# Patient Record
Sex: Male | Born: 1955 | Race: White | Hispanic: No | Smoking: Never smoker
Health system: Southern US, Community
[De-identification: ages and names within clinical notes are randomized; demographics above are authoritative.]

## PROBLEM LIST (undated history)

## (undated) DIAGNOSIS — E782 Mixed hyperlipidemia: Secondary | ICD-10-CM

## (undated) DIAGNOSIS — I350 Nonrheumatic aortic (valve) stenosis: Secondary | ICD-10-CM

## (undated) DIAGNOSIS — R42 Dizziness and giddiness: Secondary | ICD-10-CM

## (undated) DIAGNOSIS — Q231 Congenital insufficiency of aortic valve: Secondary | ICD-10-CM

## (undated) DIAGNOSIS — I639 Cerebral infarction, unspecified: Secondary | ICD-10-CM

## (undated) DIAGNOSIS — M109 Gout, unspecified: Secondary | ICD-10-CM

## (undated) DIAGNOSIS — E119 Type 2 diabetes mellitus without complications: Secondary | ICD-10-CM

## (undated) DIAGNOSIS — G43109 Migraine with aura, not intractable, without status migrainosus: Secondary | ICD-10-CM

## (undated) DIAGNOSIS — K279 Peptic ulcer, site unspecified, unspecified as acute or chronic, without hemorrhage or perforation: Secondary | ICD-10-CM

## (undated) DIAGNOSIS — I2510 Atherosclerotic heart disease of native coronary artery without angina pectoris: Secondary | ICD-10-CM

## (undated) DIAGNOSIS — F329 Major depressive disorder, single episode, unspecified: Secondary | ICD-10-CM

## (undated) DIAGNOSIS — F1010 Alcohol abuse, uncomplicated: Secondary | ICD-10-CM

## (undated) DIAGNOSIS — I209 Angina pectoris, unspecified: Secondary | ICD-10-CM

## (undated) DIAGNOSIS — E785 Hyperlipidemia, unspecified: Secondary | ICD-10-CM

## (undated) DIAGNOSIS — K219 Gastro-esophageal reflux disease without esophagitis: Secondary | ICD-10-CM

## (undated) DIAGNOSIS — G4730 Sleep apnea, unspecified: Secondary | ICD-10-CM

## (undated) DIAGNOSIS — Q2381 Bicuspid aortic valve: Secondary | ICD-10-CM

## (undated) DIAGNOSIS — I10 Essential (primary) hypertension: Secondary | ICD-10-CM

## (undated) HISTORY — DX: Angina pectoris, unspecified: I20.9

## (undated) HISTORY — DX: Gastro-esophageal reflux disease without esophagitis: K21.9

## (undated) HISTORY — DX: Congenital insufficiency of aortic valve: Q23.1

## (undated) HISTORY — PX: RHINOPLASTY: SUR1284

## (undated) HISTORY — DX: Cerebral infarction, unspecified: I63.9

## (undated) HISTORY — DX: Alcohol abuse, uncomplicated: F10.10

## (undated) HISTORY — DX: Dizziness and giddiness: R42

## (undated) HISTORY — DX: Major depressive disorder, single episode, unspecified: F32.9

## (undated) HISTORY — PX: CIRCUMCISION: SUR203

## (undated) HISTORY — DX: Hyperlipidemia, unspecified: E78.5

## (undated) HISTORY — DX: Migraine with aura, not intractable, without status migrainosus: G43.109

## (undated) HISTORY — DX: Peptic ulcer, site unspecified, unspecified as acute or chronic, without hemorrhage or perforation: K27.9

## (undated) HISTORY — DX: Sleep apnea, unspecified: G47.30

## (undated) HISTORY — DX: Gout, unspecified: M10.9

## (undated) HISTORY — DX: Bicuspid aortic valve: Q23.81

## (undated) HISTORY — DX: Essential (primary) hypertension: I10

## (undated) HISTORY — PX: INGUINAL HERNIA REPAIR: SUR1180

---

## 2007-07-21 ENCOUNTER — Emergency Department (HOSPITAL_COMMUNITY): Admission: EM | Admit: 2007-07-21 | Discharge: 2007-07-21 | Payer: Self-pay | Admitting: Emergency Medicine

## 2007-12-03 IMAGING — CT CT ABDOMEN W/ CM
3 of 8 series · 12 of 46 positions shown, 18 images · IV contrast (APPLIED)
Comparison: None

CHEST CT WITH CONTRAST

CLINICAL DATA: This was kicked in the back by horse and has large hematoma on
the midback.
TECHNIQUE: Multidetector CT imaging of the chest, abdomen, and pelvis was
performed following the standard protocol during bolus administration of
intravenous contrast. The patient was unable to lay supine, and was scanned in
the prone position.

Contrast:  100 cc Omnipaque 300

[Series 2: chest 5.0 b31f · axial · 0.67mm/px · z∈[-356,-261]mm · 3 of 166 slices shown]
[im 16/166  soft-tissue]
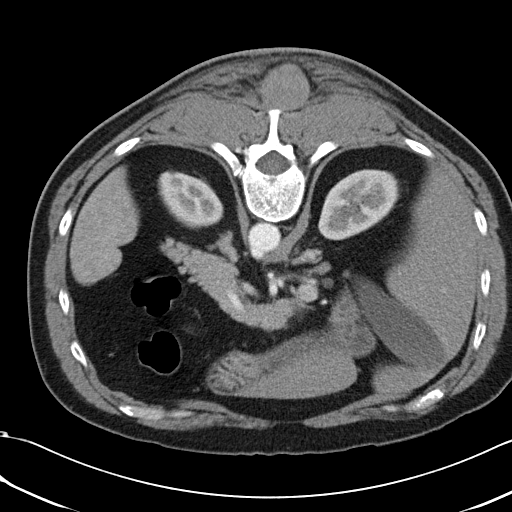
[im 31/166  soft-tissue]
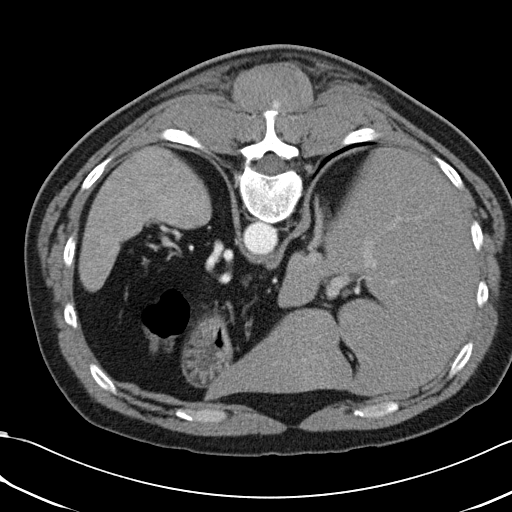
[im 61/166  soft-tissue]
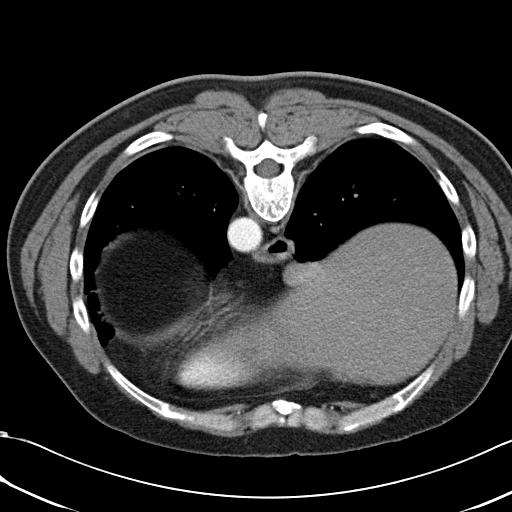

[Series 5: abd/pel 5.0 b31f · axial · 0.80mm/px · z∈[-683,-298]mm · 6 of 109 slices shown, 11 images]
[im 16/109  soft-tissue]
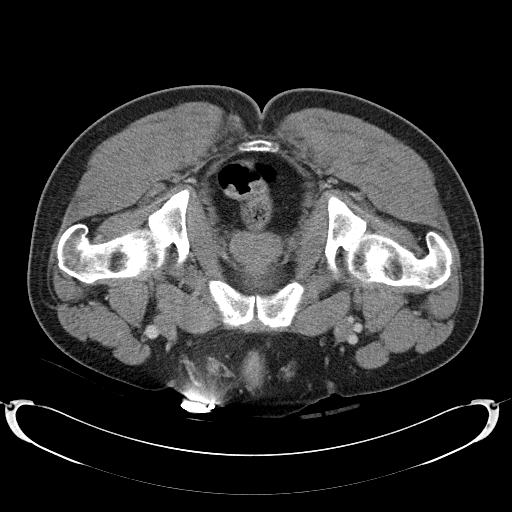
[im 16/109  bone]
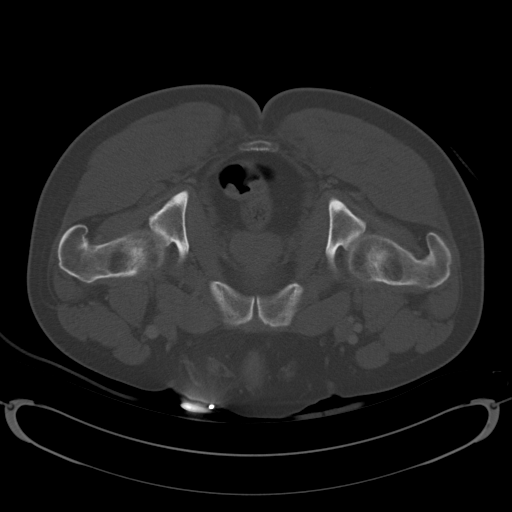
[im 31/109  soft-tissue]
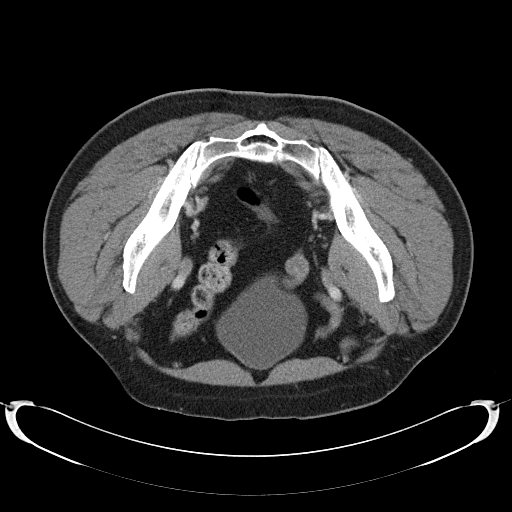
[im 47/109  soft-tissue]
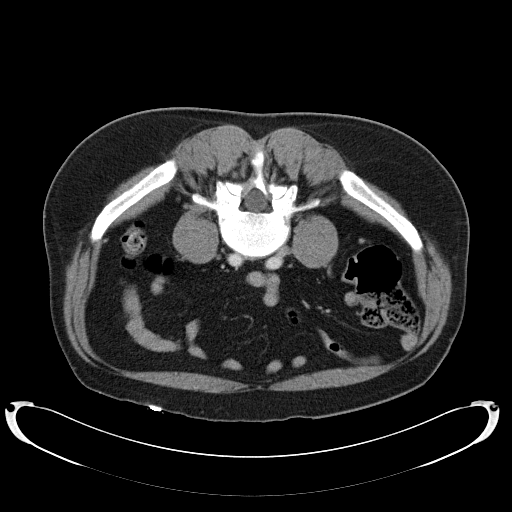
[im 47/109  lung]
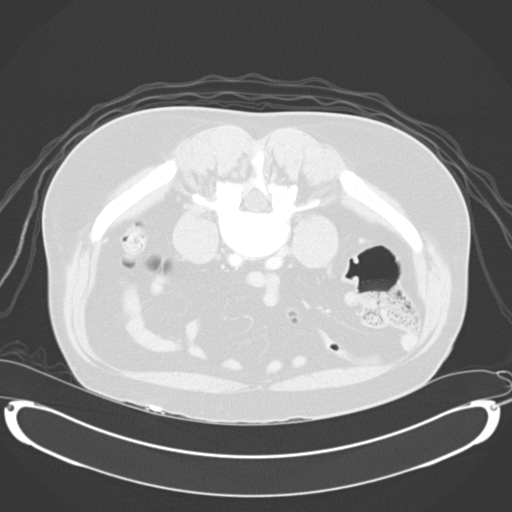
[im 62/109  soft-tissue]
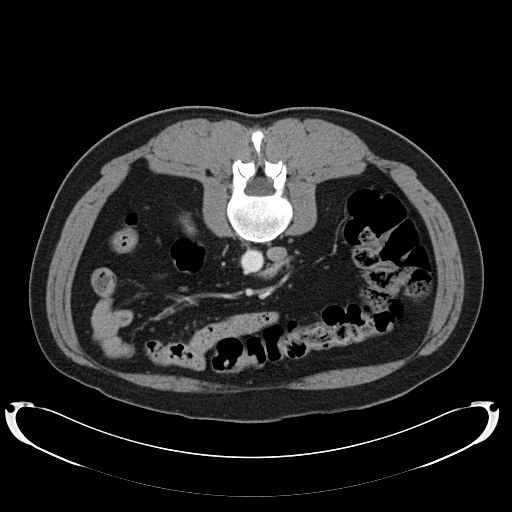
[im 62/109  lung]
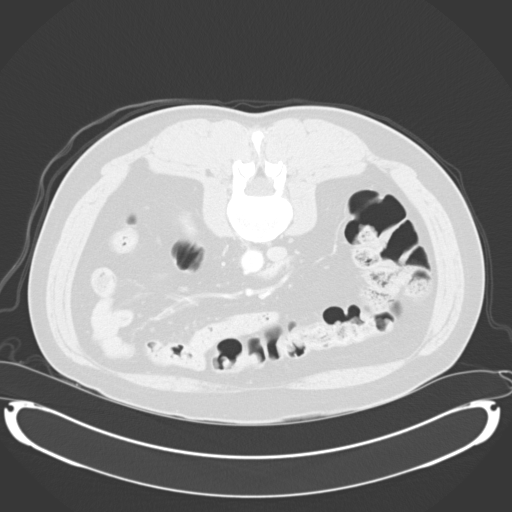
[im 78/109  soft-tissue]
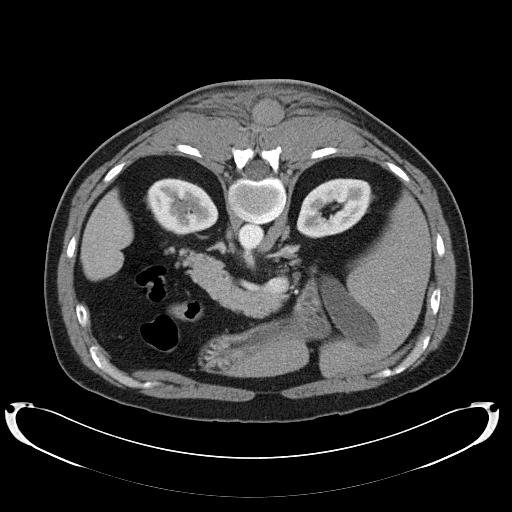
[im 78/109  lung]
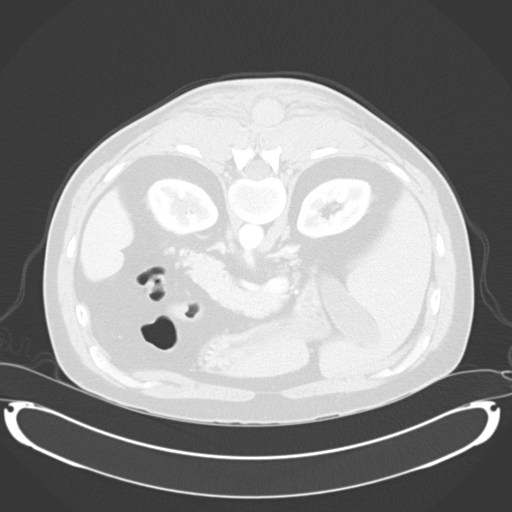
[im 93/109  soft-tissue]
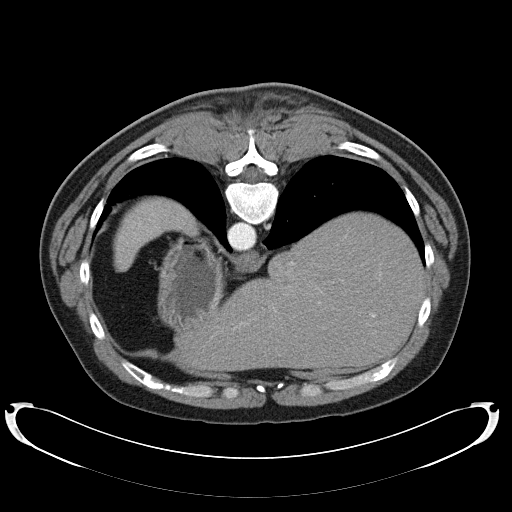
[im 93/109  lung]
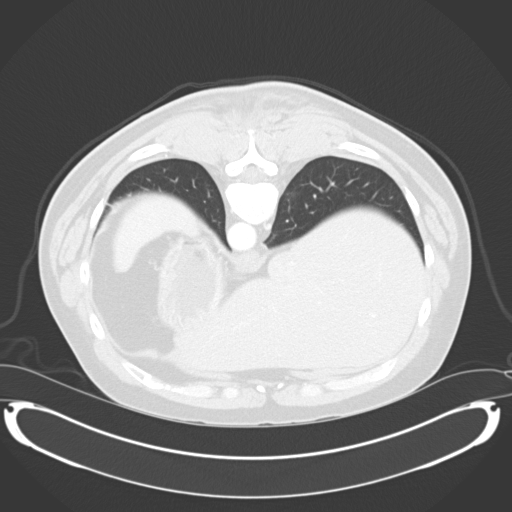

[Series 605: a/p coronals · coronal · 1.07mm/px · 3 of 132 slices shown, 4 images]
[im 33/132  soft-tissue]
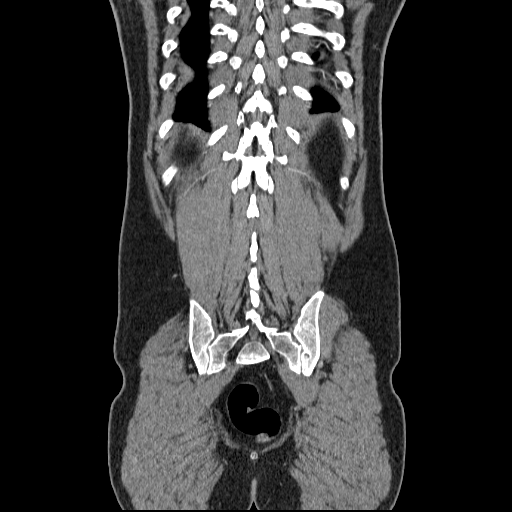
[im 66/132  soft-tissue]
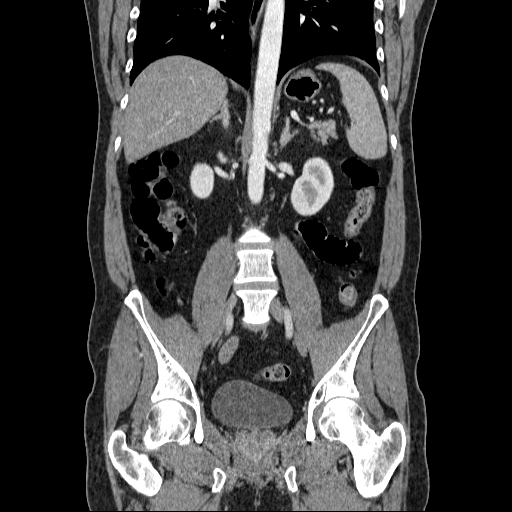
[im 66/132  bone]
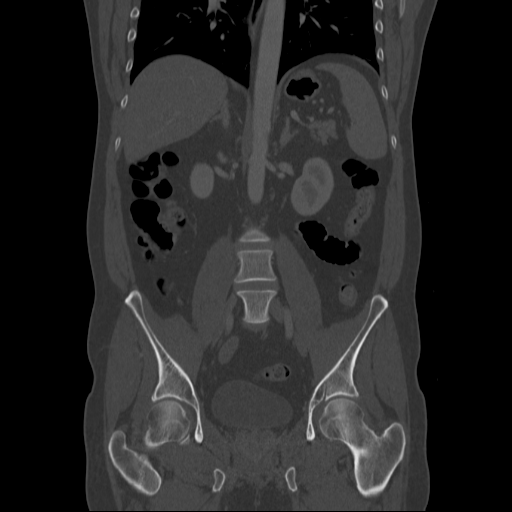
[im 99/132  soft-tissue]
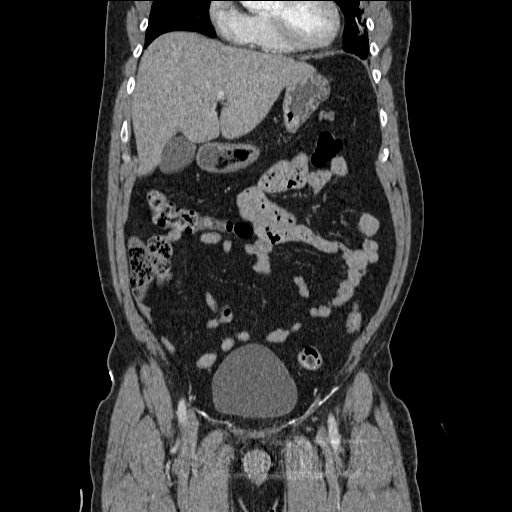

[12 of 46 positions shown; findings below may reference images not displayed]

FINDINGS: The thoracic inlet appears unremarkable. No aortic dissection
identified. Gas is noted in the esophagus which is mildly dilated. No hilar or
mediastinal adenopathy noted. Mild dependent subsegmental atelectasis is
present. No significant pleural effusion is noted. There is a suggestion of a
right-sided os acromiale, and possibly a left os acromiale as well.

IMPRESSION

1. Mild dependent subsegmental atelectasis. No acute thoracic findings.

ABDOMEN CT WITH CONTRAST
FINDINGS: There is a midline hematoma posterior to the spinous processes of the
T10, T11, and T12 levels. A small amount of active extravasation of contrast
posterior to the T11 spinous process is noted, compatible with active bleeding
into this hematoma. A total size of the hematoma is 5.8 x 3.2 x 7.7 cm. The
hematomas is in the subcutaneous tissues.  We do not demonstrate any associated
spinous process fracture, or other fracture in the lower thoracic spine or upper
lumbar spine.

No hepatic or splenic laceration is identified. No perihepatic or perisplenic
ascites. The pancreas, adrenal glands, and kidneys appear unremarkable. A lymph
node adjacent to the left adrenal gland measures 11 x 7 mm, which is not
pathologically enlarged by CT size criteria. No significant mesenteric lesion is
identified. No discrete bowel abnormality is noted. No biliary dilatation is
noted.

IMPRESSION

1. Subcutaneous hematoma overlying the spinous processes of T10, T11, and T12,
with a small amount of active extravasation compatible with active bleeding into
the hematoma at the T11 level. No underlying fracture is seen; no discrete
intra-abdominal injury is noted.

PELVIS CT WITH CONTRAST
FINDINGS: No pelvic fractures identified. No free pelvic fluid noted. The
urinary bladder appears unremarkable. No acute pelvic abnormality is evident.
The appendix appears normal.

IMPRESSION

1. No acute pelvic findings.

## 2007-12-03 IMAGING — CR DG THORACIC SPINE 2V
5 series · 5 of 5 positions shown · non-contrast
Comparison: none

CLINICAL DATA: Kicked by horse in the back

THORACIC SPINE - 2 VIEW

[t t-spine a.p. (1 of 2)]
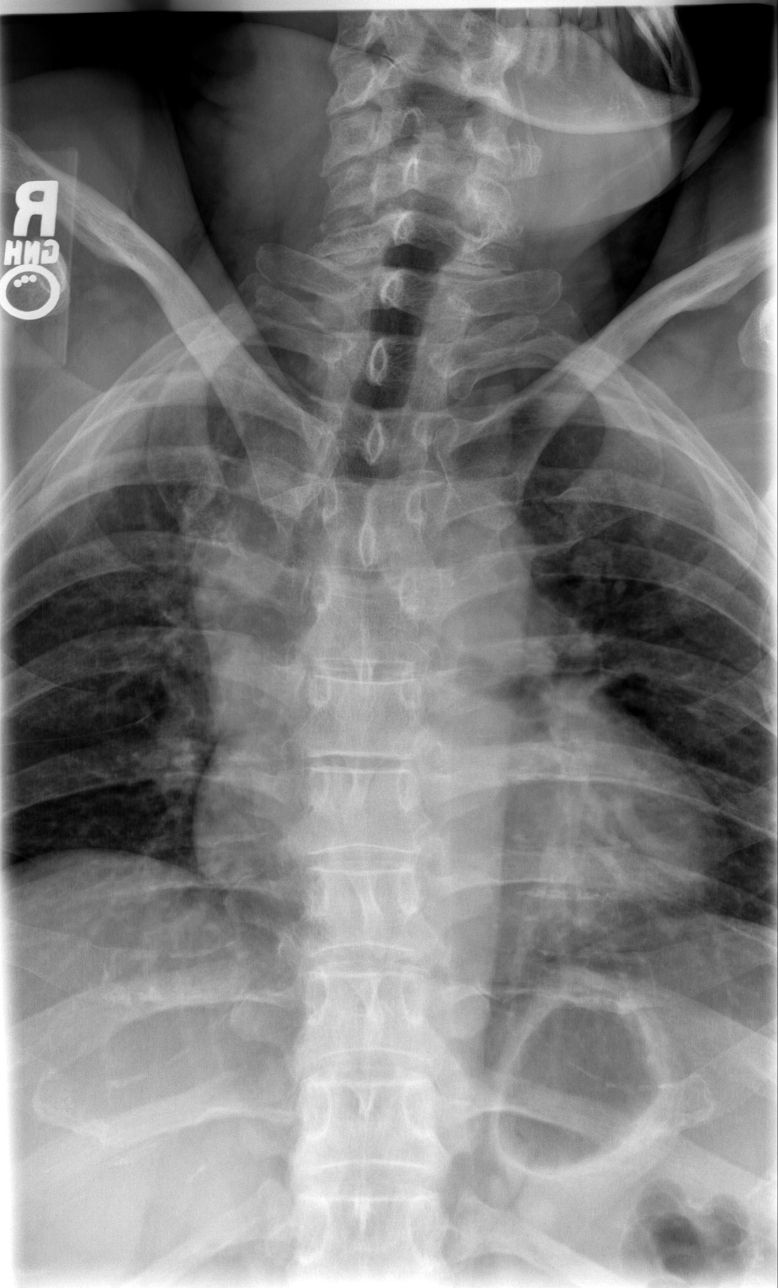

[t t-spine a.p. (2 of 2)]
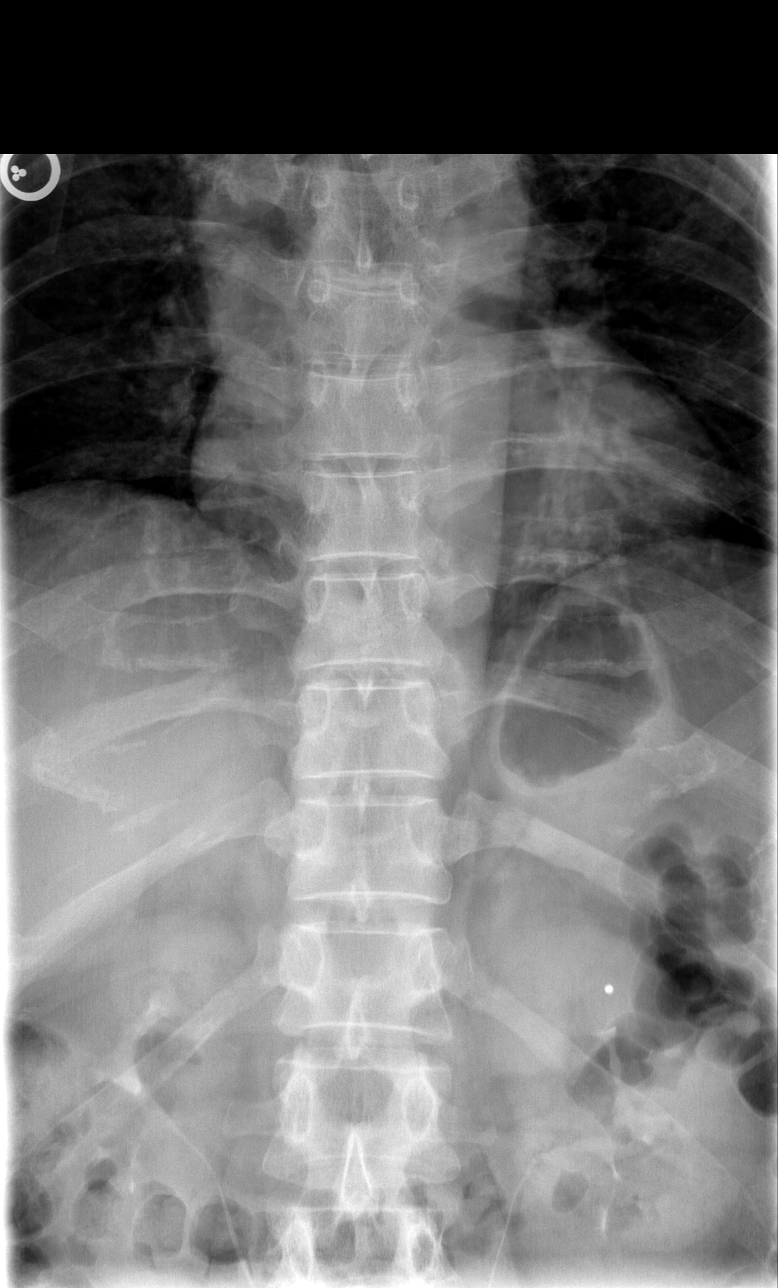

[t t-spine lat (1 of 3)]
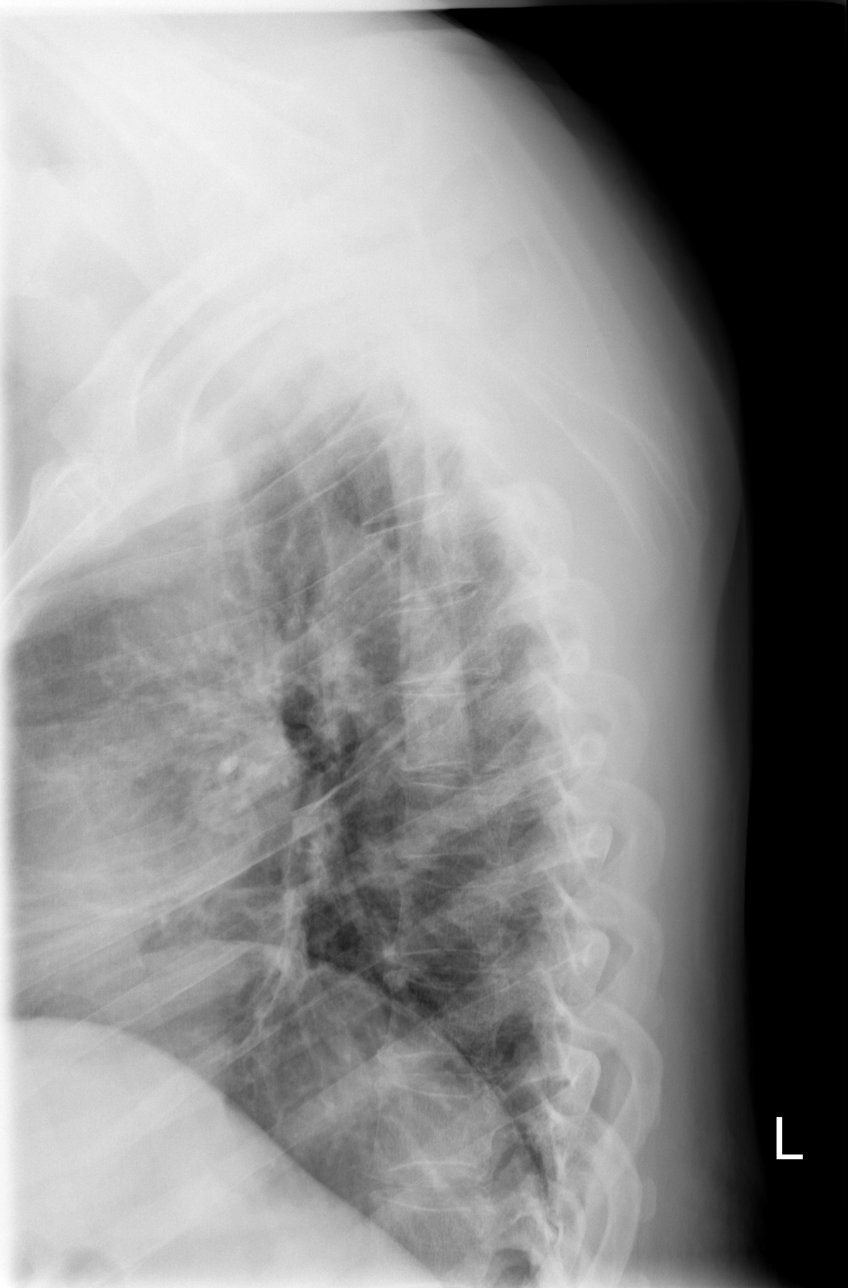

[t t-spine lat (2 of 3)]
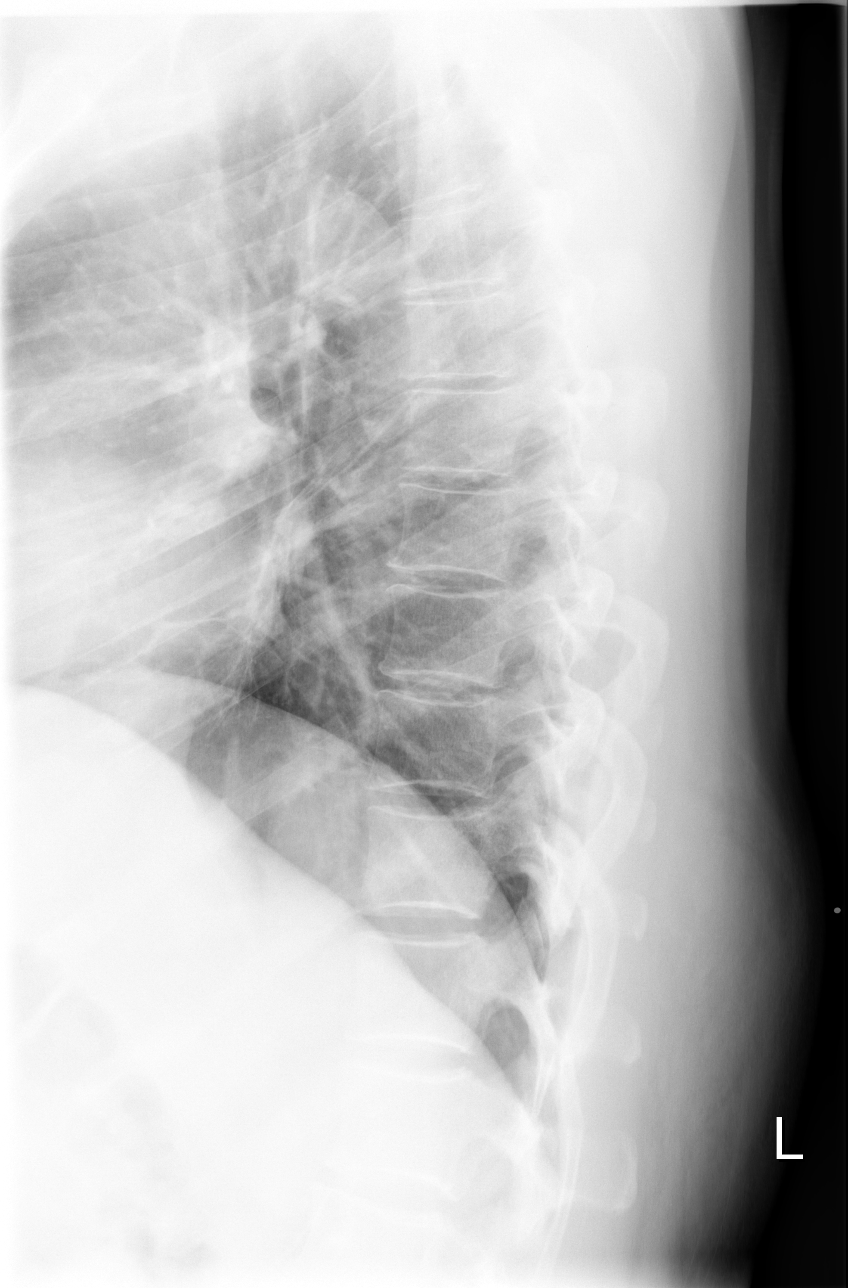

[t t-spine lat (3 of 3)]
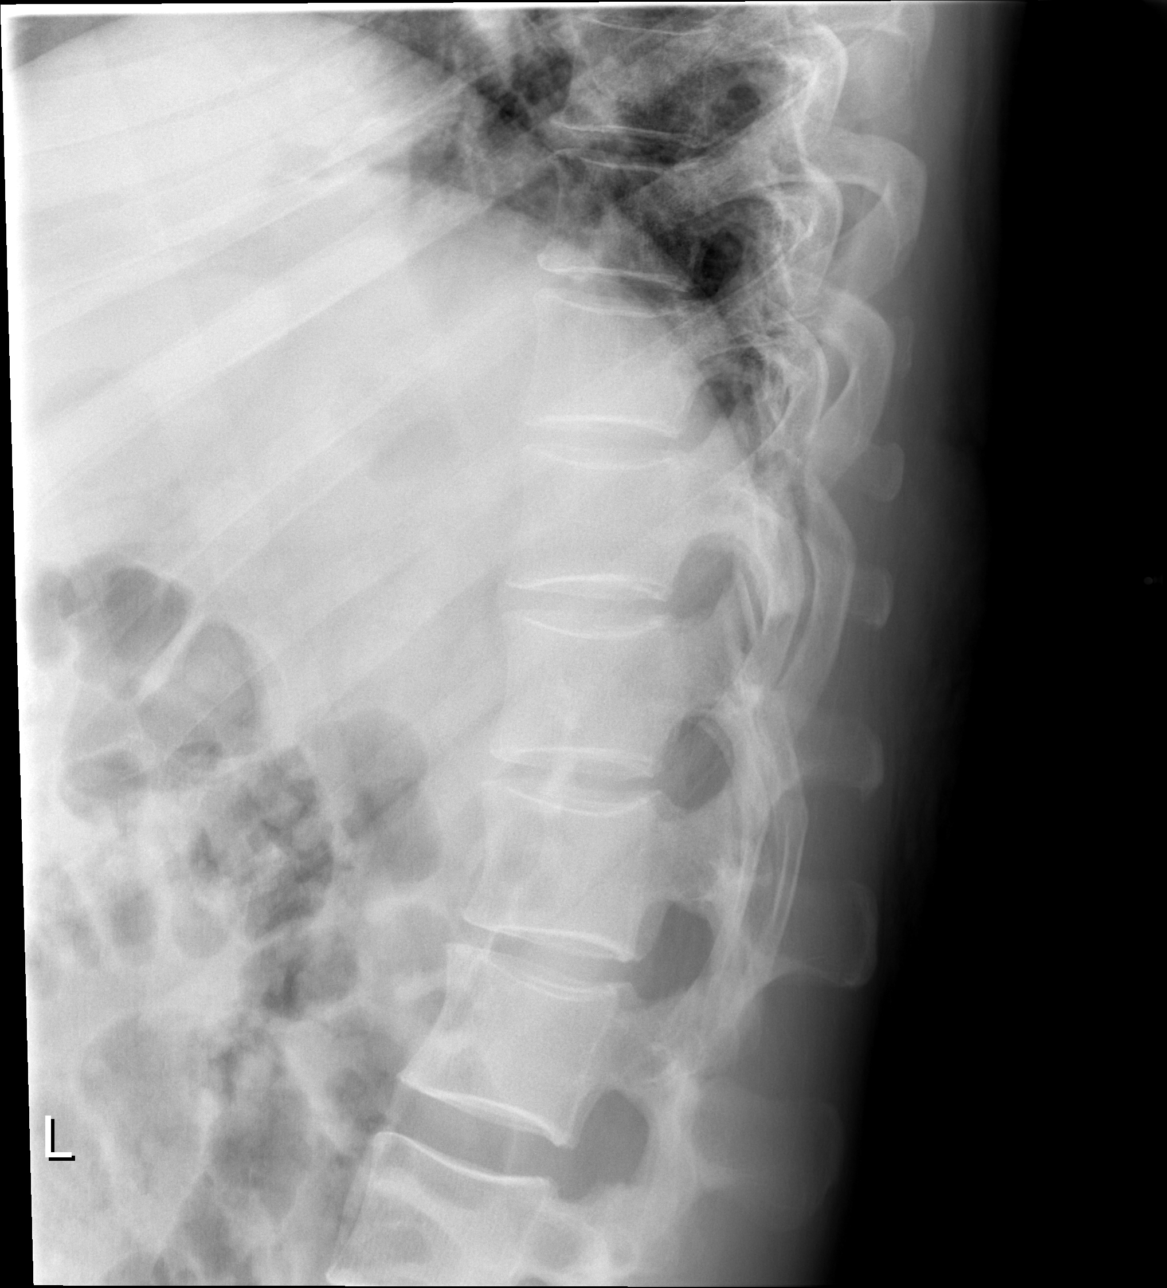

[5 of 5 positions shown; findings below may reference images not displayed]

FINDINGS: Two views were obtained on 5 images. Contrast medium is noted in the
renal collecting systems and proximal ureters.

No thoracic spine fractures identified. A BB-marker overlies a soft tissue
prominence projecting posterior to the spinous processes of T10, T11, and T12,
compatible with the patient's subcutaneous hematoma. No pleural effusion noted.
No vertebral fracture is evident.

IMPRESSION

1. No fractures identified. Subcutaneous hematoma overlies the spinous processes
of the T10, T11, and T12 levels.

## 2009-10-21 ENCOUNTER — Encounter: Admission: RE | Admit: 2009-10-21 | Discharge: 2009-12-23 | Payer: Self-pay | Admitting: Orthopedic Surgery

## 2012-11-24 HISTORY — PX: CYST REMOVAL TRUNK: SHX6283

## 2013-07-08 DIAGNOSIS — R42 Dizziness and giddiness: Secondary | ICD-10-CM

## 2013-07-09 DIAGNOSIS — G459 Transient cerebral ischemic attack, unspecified: Secondary | ICD-10-CM

## 2013-08-19 ENCOUNTER — Encounter: Payer: Self-pay | Admitting: Cardiology

## 2013-09-01 ENCOUNTER — Encounter: Payer: Self-pay | Admitting: Neurology

## 2013-09-02 ENCOUNTER — Encounter: Payer: Self-pay | Admitting: Neurology

## 2013-09-02 ENCOUNTER — Ambulatory Visit (INDEPENDENT_AMBULATORY_CARE_PROVIDER_SITE_OTHER): Payer: BC Managed Care – PPO | Admitting: Neurology

## 2013-09-02 VITALS — BP 140/90 | HR 64 | Ht 72.0 in | Wt 220.5 lb

## 2013-09-02 DIAGNOSIS — R42 Dizziness and giddiness: Secondary | ICD-10-CM | POA: Insufficient documentation

## 2013-09-02 DIAGNOSIS — G43109 Migraine with aura, not intractable, without status migrainosus: Secondary | ICD-10-CM

## 2013-09-02 HISTORY — DX: Dizziness and giddiness: R42

## 2013-09-02 HISTORY — DX: Migraine with aura, not intractable, without status migrainosus: G43.109

## 2013-09-02 MED ORDER — PROPRANOLOL HCL 10 MG PO TABS: ORAL_TABLET | ORAL | Status: DC

## 2013-09-02 NOTE — Progress Notes (Signed)
Reason for visit: TIA  Raymond Roberson is a 57 y.o. male  History of present illness:  Raymond Roberson is a 57 year old right-handed white male with a history of hypertension. The patient was admitted to the hospital in mid July 2014 at [facility] with an event of visual disturbance. The patient indicates that he was watching TV, and he suddenly had some visual alteration to the left, associated with bright flashing lights going in a circular pattern. Later on, he developed tunnel vision in the periphery. The patient indicated that the visual disturbances lasted 2-3 hours, and then cleared. The patient went to the hospital, and he recalls having headaches while at the hospital. The patient indicates that his headaches have become more frequent over the month prior to that hospitalization. The headaches are frontal and occipital in nature. The patient may have some dizziness and nausea with the headaches. The patient did have some nausea while in the hospital. 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 The patient had a 2-D echocardiogram showing an ejection fraction of 55-60% with a bicuspid aortic valve. The patient was not on aspirin on a daily basis prior to admission. Carotid Doppler studies showed less than 50% stenosis bilaterally. The patient indicates that after discharge, he had 2 events with visual field disturbance with blurring of vision off to the left, scintillating scotoma, and then tunnel vision. The episodes last about half an hour and clear. The patient did not have headache with these events. The patient reported no focal numbness or weakness of the face, arms, or legs. The patient denied slurred speech, difficulty swallowing, balance issues, or problems controlling the bowels or the bladder. The patient is sent to this office for an evaluation. The patient is now on low-dose aspirin.  Past Medical History  Diagnosis Date  . Gouty  arthritis   . Major depression   . Sleep apnea   . CVA (cerebral vascular accident)   . Bicuspid aortic valve   . Hypertension   . GERD (gastroesophageal reflux disease)   . Other and unspecified hyperlipidemia   . Acute angina   . Chronic alcohol abuse   . Classic migraine 09/02/2013  . Dizziness and giddiness 09/02/2013  . Peptic ulcer disease     Past Surgical History  Procedure Laterality Date  . Cyst removal trunk  12/13    back  . Rhinoplasty    . Inguinal hernia repair Bilateral     Family History  Problem Relation Age of Onset  . CAD Father   . Heart failure Father   . Allergies Mother   . Anxiety disorder Mother     Social history:  reports that he has never smoked. His smokeless tobacco use includes Snuff. He reports that  drinks alcohol. He reports that he does not use illicit drugs.  Medications:  Current Outpatient Prescriptions on File Prior to Visit  Medication Sig Dispense Refill  . allopurinol (ZYLOPRIM) 300 MG tablet Take 300 mg by mouth daily.      Marland Kitchen ALPRAZolam (XANAX) 0.5 MG tablet Take 0.5 mg by mouth 2 (two) times daily.      Marland Kitchen aspirin 81 MG tablet Take 81 mg by mouth daily.      . cetirizine (ZYRTEC) 10 MG tablet Take 10 mg by mouth daily.      . citalopram (CELEXA) 40 MG tablet Take 40 mg by mouth daily.      . hydrocortisone (ANUSOL-HC) 2.5 %  rectal cream Place 1 application rectally 3 (three) times daily.      Marland Kitchen lisinopril-hydrochlorothiazide (PRINZIDE,ZESTORETIC) 10-12.5 MG per tablet Take 1 tablet by mouth daily.      . pantoprazole (PROTONIX) 40 MG tablet Take 40 mg by mouth daily.       No current facility-administered medications on file prior to visit.      Allergies  Allergen Reactions  . Naproxen     nausea  . Sulfa Antibiotics     nausea    ROS:  Out of a complete 14 system review of symptoms, the patient complains only of the following symptoms, and all other reviewed systems are negative.  Weight loss, fatigue Chest pains,  heart murmur Moles Blurred vision Snoring Joint pain, joint swelling, aching muscles Memory loss, headache, dizziness Depression, anxiety  Blood pressure 140/90, pulse 64, height 6' (1.829 m), weight 220 lb 8 oz (100.018 kg).  Physical Exam  General: The patient is alert and cooperative at the time of the examination.  Head: Pupils are equal, round, and reactive to light. Discs are flat bilaterally.  Neck: The neck is supple, no carotid bruits are noted.  Respiratory: The respiratory examination is clear.  Cardiovascular: The cardiovascular examination reveals a regular rate and rhythm, no obvious murmurs or rubs are noted.  Skin: Extremities are without significant edema.  Neurologic Exam  Mental status:  Cranial nerves: Facial symmetry is not present. Slight ptosis is noted on the right. There is good sensation of the face to pinprick and soft touch bilaterally. The strength of the facial muscles and the muscles to head turning and shoulder shrug are normal bilaterally. Speech is well enunciated, no aphasia or dysarthria is noted. Extraocular movements are full. Visual fields are full.  Motor: The motor testing reveals 5 over 5 strength of all 4 extremities. Good symmetric motor tone is noted throughout.  Sensory: Sensory testing is intact to pinprick, soft touch, vibration sensation, and position sense on all 4 extremities, with exception that there is some decrease in position sense of the right foot.. No evidence of extinction is noted.  Coordination: Cerebellar testing reveals good finger-nose-finger and heel-to-shin bilaterally.  Gait and station: Gait is normal. Tandem gait is normal. Romberg is negative. No drift is seen.  Reflexes: Deep tendon reflexes are symmetric and normal bilaterally. Toes are downgoing bilaterally.   Assessment/Plan:  1. Probable classic migraine headache  2. 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 The patient had a visual aura with positive visual phenomenon, and tunnel vision. The patient had a headache following the visual field disturbance. The patient has had 2 episodes of migraine equivalent since being in the hospital. The patient is on low-dose aspirin, and he should continue this for now. The patient is concerned about the visual disturbances, as he works as a Naval architect. The patient wishes to go on medications for the migraine. I will start low dose Inderal at this time. The patient will followup in 4 months.  Marlan Palau MD 09/02/2013 7:01 PM  [facility] [address] [place], [place] [address]  Phone [phone] Fax [phone]

## 2013-09-12 ENCOUNTER — Ambulatory Visit: Payer: BC Managed Care – PPO | Admitting: Cardiovascular Disease

## 2013-09-15 ENCOUNTER — Telehealth: Payer: Self-pay | Admitting: Neurology

## 2013-09-16 MED ORDER — TOPIRAMATE 25 MG PO TABS: 25.0000 mg | ORAL_TABLET | Freq: Every day | ORAL | Status: DC

## 2013-09-16 NOTE — Telephone Encounter (Signed)
Call pt back about the medication propranolol 10mg , pt stated that he is having problems taking this medication and trying to work. Pt states that the medication has level out some, still having headaches a little, but he is getting ready to start the second dose, which is 2 tablets twice daily. Pt is concern about taking this medication. Pt wanted to know if the doctor could excuse him from work until the medication gets under control.

## 2013-09-16 NOTE — Telephone Encounter (Signed)
I called the patient. The patient indicates that he is having dizziness on a very low-dose propranolol, 10 mg twice daily. I do not believe that this medication will be tolerated by this patient. He will stop the medication, and start Topamax in low dose. He will contact me if he has issues with this drug. I reviewed the various potential side effects of this medication with the patient.

## 2013-11-03 ENCOUNTER — Ambulatory Visit: Payer: BC Managed Care – PPO | Admitting: Cardiovascular Disease

## 2014-01-09 ENCOUNTER — Ambulatory Visit: Payer: BC Managed Care – PPO | Admitting: Nurse Practitioner

## 2017-06-19 DIAGNOSIS — Z0271 Encounter for disability determination: Secondary | ICD-10-CM

## 2019-08-14 DIAGNOSIS — I10 Essential (primary) hypertension: Secondary | ICD-10-CM | POA: Diagnosis not present

## 2019-08-14 DIAGNOSIS — F331 Major depressive disorder, recurrent, moderate: Secondary | ICD-10-CM | POA: Diagnosis not present

## 2019-08-14 DIAGNOSIS — E782 Mixed hyperlipidemia: Secondary | ICD-10-CM | POA: Diagnosis not present

## 2019-08-14 DIAGNOSIS — K210 Gastro-esophageal reflux disease with esophagitis: Secondary | ICD-10-CM | POA: Diagnosis not present

## 2019-08-14 DIAGNOSIS — R5383 Other fatigue: Secondary | ICD-10-CM | POA: Diagnosis not present

## 2019-08-14 DIAGNOSIS — Q231 Congenital insufficiency of aortic valve: Secondary | ICD-10-CM | POA: Diagnosis not present

## 2019-08-14 DIAGNOSIS — F411 Generalized anxiety disorder: Secondary | ICD-10-CM | POA: Diagnosis not present

## 2019-08-14 DIAGNOSIS — Z79891 Long term (current) use of opiate analgesic: Secondary | ICD-10-CM | POA: Diagnosis not present

## 2019-08-14 DIAGNOSIS — R972 Elevated prostate specific antigen [PSA]: Secondary | ICD-10-CM | POA: Diagnosis not present

## 2019-08-14 DIAGNOSIS — M1712 Unilateral primary osteoarthritis, left knee: Secondary | ICD-10-CM | POA: Diagnosis not present

## 2019-08-19 DIAGNOSIS — I10 Essential (primary) hypertension: Secondary | ICD-10-CM | POA: Diagnosis not present

## 2019-08-19 DIAGNOSIS — M1711 Unilateral primary osteoarthritis, right knee: Secondary | ICD-10-CM | POA: Diagnosis not present

## 2019-08-19 DIAGNOSIS — Z79891 Long term (current) use of opiate analgesic: Secondary | ICD-10-CM | POA: Diagnosis not present

## 2019-08-19 DIAGNOSIS — G252 Other specified forms of tremor: Secondary | ICD-10-CM | POA: Diagnosis not present

## 2019-08-19 DIAGNOSIS — F411 Generalized anxiety disorder: Secondary | ICD-10-CM | POA: Diagnosis not present

## 2019-08-19 DIAGNOSIS — F331 Major depressive disorder, recurrent, moderate: Secondary | ICD-10-CM | POA: Diagnosis not present

## 2019-08-19 DIAGNOSIS — K760 Fatty (change of) liver, not elsewhere classified: Secondary | ICD-10-CM | POA: Diagnosis not present

## 2019-08-19 DIAGNOSIS — M1712 Unilateral primary osteoarthritis, left knee: Secondary | ICD-10-CM | POA: Diagnosis not present

## 2019-11-12 DIAGNOSIS — M1990 Unspecified osteoarthritis, unspecified site: Secondary | ICD-10-CM | POA: Diagnosis not present

## 2019-11-12 DIAGNOSIS — R531 Weakness: Secondary | ICD-10-CM | POA: Diagnosis not present

## 2019-11-12 DIAGNOSIS — R079 Chest pain, unspecified: Secondary | ICD-10-CM | POA: Diagnosis not present

## 2019-11-12 DIAGNOSIS — R05 Cough: Secondary | ICD-10-CM | POA: Diagnosis not present

## 2019-11-12 DIAGNOSIS — Z79899 Other long term (current) drug therapy: Secondary | ICD-10-CM | POA: Diagnosis not present

## 2019-11-12 DIAGNOSIS — F17200 Nicotine dependence, unspecified, uncomplicated: Secondary | ICD-10-CM | POA: Diagnosis not present

## 2019-11-12 DIAGNOSIS — Z882 Allergy status to sulfonamides status: Secondary | ICD-10-CM | POA: Diagnosis not present

## 2019-11-12 DIAGNOSIS — R5383 Other fatigue: Secondary | ICD-10-CM | POA: Diagnosis not present

## 2019-11-12 DIAGNOSIS — Z8673 Personal history of transient ischemic attack (TIA), and cerebral infarction without residual deficits: Secondary | ICD-10-CM | POA: Diagnosis not present

## 2019-11-12 DIAGNOSIS — K219 Gastro-esophageal reflux disease without esophagitis: Secondary | ICD-10-CM | POA: Diagnosis not present

## 2019-12-25 DIAGNOSIS — I10 Essential (primary) hypertension: Secondary | ICD-10-CM | POA: Diagnosis not present

## 2019-12-25 DIAGNOSIS — K219 Gastro-esophageal reflux disease without esophagitis: Secondary | ICD-10-CM | POA: Diagnosis not present

## 2019-12-30 DIAGNOSIS — Z0001 Encounter for general adult medical examination with abnormal findings: Secondary | ICD-10-CM | POA: Diagnosis not present

## 2019-12-30 DIAGNOSIS — F331 Major depressive disorder, recurrent, moderate: Secondary | ICD-10-CM | POA: Diagnosis not present

## 2019-12-30 DIAGNOSIS — K219 Gastro-esophageal reflux disease without esophagitis: Secondary | ICD-10-CM | POA: Diagnosis not present

## 2019-12-30 DIAGNOSIS — F411 Generalized anxiety disorder: Secondary | ICD-10-CM | POA: Diagnosis not present

## 2019-12-30 DIAGNOSIS — R4582 Worries: Secondary | ICD-10-CM | POA: Diagnosis not present

## 2019-12-30 DIAGNOSIS — N400 Enlarged prostate without lower urinary tract symptoms: Secondary | ICD-10-CM | POA: Diagnosis not present

## 2019-12-30 DIAGNOSIS — M545 Low back pain: Secondary | ICD-10-CM | POA: Diagnosis not present

## 2019-12-30 DIAGNOSIS — J301 Allergic rhinitis due to pollen: Secondary | ICD-10-CM | POA: Diagnosis not present

## 2019-12-30 DIAGNOSIS — M1711 Unilateral primary osteoarthritis, right knee: Secondary | ICD-10-CM | POA: Diagnosis not present

## 2019-12-30 DIAGNOSIS — Z6827 Body mass index (BMI) 27.0-27.9, adult: Secondary | ICD-10-CM | POA: Diagnosis not present

## 2019-12-30 DIAGNOSIS — Z79891 Long term (current) use of opiate analgesic: Secondary | ICD-10-CM | POA: Diagnosis not present

## 2019-12-30 DIAGNOSIS — Z1212 Encounter for screening for malignant neoplasm of rectum: Secondary | ICD-10-CM | POA: Diagnosis not present

## 2019-12-30 DIAGNOSIS — I10 Essential (primary) hypertension: Secondary | ICD-10-CM | POA: Diagnosis not present

## 2019-12-30 DIAGNOSIS — K760 Fatty (change of) liver, not elsewhere classified: Secondary | ICD-10-CM | POA: Diagnosis not present

## 2019-12-30 DIAGNOSIS — M1712 Unilateral primary osteoarthritis, left knee: Secondary | ICD-10-CM | POA: Diagnosis not present

## 2019-12-30 DIAGNOSIS — E782 Mixed hyperlipidemia: Secondary | ICD-10-CM | POA: Diagnosis not present

## 2020-01-23 DIAGNOSIS — F331 Major depressive disorder, recurrent, moderate: Secondary | ICD-10-CM | POA: Diagnosis not present

## 2020-01-23 DIAGNOSIS — K219 Gastro-esophageal reflux disease without esophagitis: Secondary | ICD-10-CM | POA: Diagnosis not present

## 2020-02-20 DIAGNOSIS — I10 Essential (primary) hypertension: Secondary | ICD-10-CM | POA: Diagnosis not present

## 2020-02-20 DIAGNOSIS — E7849 Other hyperlipidemia: Secondary | ICD-10-CM | POA: Diagnosis not present

## 2020-03-24 DIAGNOSIS — I10 Essential (primary) hypertension: Secondary | ICD-10-CM | POA: Diagnosis not present

## 2020-03-24 DIAGNOSIS — E7849 Other hyperlipidemia: Secondary | ICD-10-CM | POA: Diagnosis not present

## 2020-03-30 DIAGNOSIS — I10 Essential (primary) hypertension: Secondary | ICD-10-CM | POA: Diagnosis not present

## 2020-03-30 DIAGNOSIS — K219 Gastro-esophageal reflux disease without esophagitis: Secondary | ICD-10-CM | POA: Diagnosis not present

## 2020-03-30 DIAGNOSIS — E782 Mixed hyperlipidemia: Secondary | ICD-10-CM | POA: Diagnosis not present

## 2020-04-07 DIAGNOSIS — Z6828 Body mass index (BMI) 28.0-28.9, adult: Secondary | ICD-10-CM | POA: Diagnosis not present

## 2020-04-07 DIAGNOSIS — N486 Induration penis plastica: Secondary | ICD-10-CM | POA: Diagnosis not present

## 2020-04-07 DIAGNOSIS — I10 Essential (primary) hypertension: Secondary | ICD-10-CM | POA: Diagnosis not present

## 2020-04-07 DIAGNOSIS — K760 Fatty (change of) liver, not elsewhere classified: Secondary | ICD-10-CM | POA: Diagnosis not present

## 2020-04-07 DIAGNOSIS — F411 Generalized anxiety disorder: Secondary | ICD-10-CM | POA: Diagnosis not present

## 2020-04-07 DIAGNOSIS — J301 Allergic rhinitis due to pollen: Secondary | ICD-10-CM | POA: Diagnosis not present

## 2020-04-07 DIAGNOSIS — M545 Low back pain: Secondary | ICD-10-CM | POA: Diagnosis not present

## 2020-04-07 DIAGNOSIS — M1711 Unilateral primary osteoarthritis, right knee: Secondary | ICD-10-CM | POA: Diagnosis not present

## 2020-04-07 DIAGNOSIS — F331 Major depressive disorder, recurrent, moderate: Secondary | ICD-10-CM | POA: Diagnosis not present

## 2020-04-07 DIAGNOSIS — Z0001 Encounter for general adult medical examination with abnormal findings: Secondary | ICD-10-CM | POA: Diagnosis not present

## 2020-04-07 DIAGNOSIS — E782 Mixed hyperlipidemia: Secondary | ICD-10-CM | POA: Diagnosis not present

## 2020-04-07 DIAGNOSIS — K219 Gastro-esophageal reflux disease without esophagitis: Secondary | ICD-10-CM | POA: Diagnosis not present

## 2020-04-07 DIAGNOSIS — R4582 Worries: Secondary | ICD-10-CM | POA: Diagnosis not present

## 2020-04-23 DIAGNOSIS — F331 Major depressive disorder, recurrent, moderate: Secondary | ICD-10-CM | POA: Diagnosis not present

## 2020-04-23 DIAGNOSIS — K921 Melena: Secondary | ICD-10-CM | POA: Diagnosis not present

## 2020-04-23 DIAGNOSIS — F411 Generalized anxiety disorder: Secondary | ICD-10-CM | POA: Diagnosis not present

## 2020-04-27 DIAGNOSIS — K769 Liver disease, unspecified: Secondary | ICD-10-CM | POA: Diagnosis not present

## 2020-04-27 DIAGNOSIS — Z8619 Personal history of other infectious and parasitic diseases: Secondary | ICD-10-CM | POA: Diagnosis not present

## 2020-04-27 DIAGNOSIS — K759 Inflammatory liver disease, unspecified: Secondary | ICD-10-CM | POA: Diagnosis not present

## 2020-04-27 DIAGNOSIS — K648 Other hemorrhoids: Secondary | ICD-10-CM | POA: Diagnosis not present

## 2020-04-27 DIAGNOSIS — R195 Other fecal abnormalities: Secondary | ICD-10-CM | POA: Diagnosis not present

## 2020-04-27 DIAGNOSIS — K625 Hemorrhage of anus and rectum: Secondary | ICD-10-CM | POA: Diagnosis not present

## 2020-04-27 DIAGNOSIS — K644 Residual hemorrhoidal skin tags: Secondary | ICD-10-CM | POA: Diagnosis not present

## 2020-05-24 DIAGNOSIS — F331 Major depressive disorder, recurrent, moderate: Secondary | ICD-10-CM | POA: Diagnosis not present

## 2020-05-24 DIAGNOSIS — E7849 Other hyperlipidemia: Secondary | ICD-10-CM | POA: Diagnosis not present

## 2020-05-24 DIAGNOSIS — Q231 Congenital insufficiency of aortic valve: Secondary | ICD-10-CM | POA: Diagnosis not present

## 2020-05-24 DIAGNOSIS — I10 Essential (primary) hypertension: Secondary | ICD-10-CM | POA: Diagnosis not present

## 2020-08-04 DIAGNOSIS — R4582 Worries: Secondary | ICD-10-CM | POA: Diagnosis not present

## 2020-08-04 DIAGNOSIS — N486 Induration penis plastica: Secondary | ICD-10-CM | POA: Diagnosis not present

## 2020-08-04 DIAGNOSIS — Z20828 Contact with and (suspected) exposure to other viral communicable diseases: Secondary | ICD-10-CM | POA: Diagnosis not present

## 2020-08-04 DIAGNOSIS — E782 Mixed hyperlipidemia: Secondary | ICD-10-CM | POA: Diagnosis not present

## 2020-08-04 DIAGNOSIS — I10 Essential (primary) hypertension: Secondary | ICD-10-CM | POA: Diagnosis not present

## 2020-08-04 DIAGNOSIS — F331 Major depressive disorder, recurrent, moderate: Secondary | ICD-10-CM | POA: Diagnosis not present

## 2020-08-04 DIAGNOSIS — F411 Generalized anxiety disorder: Secondary | ICD-10-CM | POA: Diagnosis not present

## 2020-08-04 DIAGNOSIS — K760 Fatty (change of) liver, not elsewhere classified: Secondary | ICD-10-CM | POA: Diagnosis not present

## 2020-09-23 DIAGNOSIS — F331 Major depressive disorder, recurrent, moderate: Secondary | ICD-10-CM | POA: Diagnosis not present

## 2020-09-23 DIAGNOSIS — Q231 Congenital insufficiency of aortic valve: Secondary | ICD-10-CM | POA: Diagnosis not present

## 2020-09-23 DIAGNOSIS — E7849 Other hyperlipidemia: Secondary | ICD-10-CM | POA: Diagnosis not present

## 2020-09-23 DIAGNOSIS — I10 Essential (primary) hypertension: Secondary | ICD-10-CM | POA: Diagnosis not present

## 2020-10-21 DIAGNOSIS — J329 Chronic sinusitis, unspecified: Secondary | ICD-10-CM | POA: Diagnosis not present

## 2020-10-26 DIAGNOSIS — R079 Chest pain, unspecified: Secondary | ICD-10-CM | POA: Diagnosis not present

## 2020-10-26 DIAGNOSIS — Z20828 Contact with and (suspected) exposure to other viral communicable diseases: Secondary | ICD-10-CM | POA: Diagnosis not present

## 2020-11-01 DIAGNOSIS — E782 Mixed hyperlipidemia: Secondary | ICD-10-CM | POA: Diagnosis not present

## 2020-11-01 DIAGNOSIS — K2100 Gastro-esophageal reflux disease with esophagitis, without bleeding: Secondary | ICD-10-CM | POA: Diagnosis not present

## 2020-11-01 DIAGNOSIS — Z1329 Encounter for screening for other suspected endocrine disorder: Secondary | ICD-10-CM | POA: Diagnosis not present

## 2020-11-01 DIAGNOSIS — Z79891 Long term (current) use of opiate analgesic: Secondary | ICD-10-CM | POA: Diagnosis not present

## 2020-11-01 DIAGNOSIS — I10 Essential (primary) hypertension: Secondary | ICD-10-CM | POA: Diagnosis not present

## 2020-11-01 DIAGNOSIS — R5383 Other fatigue: Secondary | ICD-10-CM | POA: Diagnosis not present

## 2020-11-05 DIAGNOSIS — Z6829 Body mass index (BMI) 29.0-29.9, adult: Secondary | ICD-10-CM | POA: Diagnosis not present

## 2020-11-05 DIAGNOSIS — I10 Essential (primary) hypertension: Secondary | ICD-10-CM | POA: Diagnosis not present

## 2020-11-05 DIAGNOSIS — Z0001 Encounter for general adult medical examination with abnormal findings: Secondary | ICD-10-CM | POA: Diagnosis not present

## 2020-11-05 DIAGNOSIS — L219 Seborrheic dermatitis, unspecified: Secondary | ICD-10-CM | POA: Diagnosis not present

## 2020-11-05 DIAGNOSIS — E782 Mixed hyperlipidemia: Secondary | ICD-10-CM | POA: Diagnosis not present

## 2020-11-05 DIAGNOSIS — Z23 Encounter for immunization: Secondary | ICD-10-CM | POA: Diagnosis not present

## 2020-11-05 DIAGNOSIS — R4582 Worries: Secondary | ICD-10-CM | POA: Diagnosis not present

## 2020-11-05 DIAGNOSIS — Z79891 Long term (current) use of opiate analgesic: Secondary | ICD-10-CM | POA: Diagnosis not present

## 2021-02-07 DIAGNOSIS — E782 Mixed hyperlipidemia: Secondary | ICD-10-CM | POA: Diagnosis not present

## 2021-02-07 DIAGNOSIS — R5383 Other fatigue: Secondary | ICD-10-CM | POA: Diagnosis not present

## 2021-02-07 DIAGNOSIS — R972 Elevated prostate specific antigen [PSA]: Secondary | ICD-10-CM | POA: Diagnosis not present

## 2021-02-07 DIAGNOSIS — I10 Essential (primary) hypertension: Secondary | ICD-10-CM | POA: Diagnosis not present

## 2021-02-07 DIAGNOSIS — K2100 Gastro-esophageal reflux disease with esophagitis, without bleeding: Secondary | ICD-10-CM | POA: Diagnosis not present

## 2021-02-07 DIAGNOSIS — E7849 Other hyperlipidemia: Secondary | ICD-10-CM | POA: Diagnosis not present

## 2021-02-10 DIAGNOSIS — K760 Fatty (change of) liver, not elsewhere classified: Secondary | ICD-10-CM | POA: Diagnosis not present

## 2021-02-10 DIAGNOSIS — E7849 Other hyperlipidemia: Secondary | ICD-10-CM | POA: Diagnosis not present

## 2021-02-10 DIAGNOSIS — F331 Major depressive disorder, recurrent, moderate: Secondary | ICD-10-CM | POA: Diagnosis not present

## 2021-02-10 DIAGNOSIS — R4582 Worries: Secondary | ICD-10-CM | POA: Diagnosis not present

## 2021-02-10 DIAGNOSIS — N486 Induration penis plastica: Secondary | ICD-10-CM | POA: Diagnosis not present

## 2021-02-10 DIAGNOSIS — I10 Essential (primary) hypertension: Secondary | ICD-10-CM | POA: Diagnosis not present

## 2021-02-10 DIAGNOSIS — M25512 Pain in left shoulder: Secondary | ICD-10-CM | POA: Diagnosis not present

## 2021-02-10 DIAGNOSIS — L219 Seborrheic dermatitis, unspecified: Secondary | ICD-10-CM | POA: Diagnosis not present

## 2021-03-24 DIAGNOSIS — K148 Other diseases of tongue: Secondary | ICD-10-CM | POA: Diagnosis not present

## 2021-05-02 DIAGNOSIS — K1321 Leukoplakia of oral mucosa, including tongue: Secondary | ICD-10-CM | POA: Diagnosis not present

## 2021-05-02 DIAGNOSIS — K148 Other diseases of tongue: Secondary | ICD-10-CM | POA: Diagnosis not present

## 2021-05-05 DIAGNOSIS — E7849 Other hyperlipidemia: Secondary | ICD-10-CM | POA: Diagnosis not present

## 2021-05-05 DIAGNOSIS — E782 Mixed hyperlipidemia: Secondary | ICD-10-CM | POA: Diagnosis not present

## 2021-05-05 DIAGNOSIS — R5383 Other fatigue: Secondary | ICD-10-CM | POA: Diagnosis not present

## 2021-05-05 DIAGNOSIS — I10 Essential (primary) hypertension: Secondary | ICD-10-CM | POA: Diagnosis not present

## 2021-05-09 DIAGNOSIS — K760 Fatty (change of) liver, not elsewhere classified: Secondary | ICD-10-CM | POA: Diagnosis not present

## 2021-05-09 DIAGNOSIS — L219 Seborrheic dermatitis, unspecified: Secondary | ICD-10-CM | POA: Diagnosis not present

## 2021-05-09 DIAGNOSIS — Z6830 Body mass index (BMI) 30.0-30.9, adult: Secondary | ICD-10-CM | POA: Diagnosis not present

## 2021-05-09 DIAGNOSIS — E1165 Type 2 diabetes mellitus with hyperglycemia: Secondary | ICD-10-CM | POA: Diagnosis not present

## 2021-05-09 DIAGNOSIS — F331 Major depressive disorder, recurrent, moderate: Secondary | ICD-10-CM | POA: Diagnosis not present

## 2021-05-09 DIAGNOSIS — N486 Induration penis plastica: Secondary | ICD-10-CM | POA: Diagnosis not present

## 2021-05-09 DIAGNOSIS — I10 Essential (primary) hypertension: Secondary | ICD-10-CM | POA: Diagnosis not present

## 2021-05-09 DIAGNOSIS — R4582 Worries: Secondary | ICD-10-CM | POA: Diagnosis not present

## 2021-05-23 DIAGNOSIS — E7849 Other hyperlipidemia: Secondary | ICD-10-CM | POA: Diagnosis not present

## 2021-05-23 DIAGNOSIS — Q231 Congenital insufficiency of aortic valve: Secondary | ICD-10-CM | POA: Diagnosis not present

## 2021-05-23 DIAGNOSIS — F331 Major depressive disorder, recurrent, moderate: Secondary | ICD-10-CM | POA: Diagnosis not present

## 2021-05-23 DIAGNOSIS — I10 Essential (primary) hypertension: Secondary | ICD-10-CM | POA: Diagnosis not present

## 2021-06-23 DIAGNOSIS — Q231 Congenital insufficiency of aortic valve: Secondary | ICD-10-CM | POA: Diagnosis not present

## 2021-06-23 DIAGNOSIS — I10 Essential (primary) hypertension: Secondary | ICD-10-CM | POA: Diagnosis not present

## 2021-06-23 DIAGNOSIS — F331 Major depressive disorder, recurrent, moderate: Secondary | ICD-10-CM | POA: Diagnosis not present

## 2021-06-23 DIAGNOSIS — E7849 Other hyperlipidemia: Secondary | ICD-10-CM | POA: Diagnosis not present

## 2021-08-12 DIAGNOSIS — R5383 Other fatigue: Secondary | ICD-10-CM | POA: Diagnosis not present

## 2021-08-12 DIAGNOSIS — E782 Mixed hyperlipidemia: Secondary | ICD-10-CM | POA: Diagnosis not present

## 2021-08-12 DIAGNOSIS — E1165 Type 2 diabetes mellitus with hyperglycemia: Secondary | ICD-10-CM | POA: Diagnosis not present

## 2021-08-12 DIAGNOSIS — Z1329 Encounter for screening for other suspected endocrine disorder: Secondary | ICD-10-CM | POA: Diagnosis not present

## 2021-08-12 DIAGNOSIS — E7849 Other hyperlipidemia: Secondary | ICD-10-CM | POA: Diagnosis not present

## 2021-08-12 DIAGNOSIS — K2100 Gastro-esophageal reflux disease with esophagitis, without bleeding: Secondary | ICD-10-CM | POA: Diagnosis not present

## 2021-08-12 DIAGNOSIS — I10 Essential (primary) hypertension: Secondary | ICD-10-CM | POA: Diagnosis not present

## 2021-08-15 DIAGNOSIS — M1712 Unilateral primary osteoarthritis, left knee: Secondary | ICD-10-CM | POA: Diagnosis not present

## 2021-08-15 DIAGNOSIS — L219 Seborrheic dermatitis, unspecified: Secondary | ICD-10-CM | POA: Diagnosis not present

## 2021-08-15 DIAGNOSIS — E7849 Other hyperlipidemia: Secondary | ICD-10-CM | POA: Diagnosis not present

## 2021-08-15 DIAGNOSIS — E782 Mixed hyperlipidemia: Secondary | ICD-10-CM | POA: Diagnosis not present

## 2021-08-15 DIAGNOSIS — N486 Induration penis plastica: Secondary | ICD-10-CM | POA: Diagnosis not present

## 2021-08-15 DIAGNOSIS — J301 Allergic rhinitis due to pollen: Secondary | ICD-10-CM | POA: Diagnosis not present

## 2021-08-15 DIAGNOSIS — R4582 Worries: Secondary | ICD-10-CM | POA: Diagnosis not present

## 2021-08-15 DIAGNOSIS — F411 Generalized anxiety disorder: Secondary | ICD-10-CM | POA: Diagnosis not present

## 2021-08-15 DIAGNOSIS — K760 Fatty (change of) liver, not elsewhere classified: Secondary | ICD-10-CM | POA: Diagnosis not present

## 2021-08-15 DIAGNOSIS — M545 Low back pain, unspecified: Secondary | ICD-10-CM | POA: Diagnosis not present

## 2021-08-15 DIAGNOSIS — F331 Major depressive disorder, recurrent, moderate: Secondary | ICD-10-CM | POA: Diagnosis not present

## 2021-08-15 DIAGNOSIS — I10 Essential (primary) hypertension: Secondary | ICD-10-CM | POA: Diagnosis not present

## 2021-08-15 DIAGNOSIS — M1711 Unilateral primary osteoarthritis, right knee: Secondary | ICD-10-CM | POA: Diagnosis not present

## 2021-08-15 DIAGNOSIS — E1165 Type 2 diabetes mellitus with hyperglycemia: Secondary | ICD-10-CM | POA: Diagnosis not present

## 2021-08-15 DIAGNOSIS — Z79891 Long term (current) use of opiate analgesic: Secondary | ICD-10-CM | POA: Diagnosis not present

## 2021-11-03 DIAGNOSIS — J209 Acute bronchitis, unspecified: Secondary | ICD-10-CM | POA: Diagnosis not present

## 2021-11-03 DIAGNOSIS — E1165 Type 2 diabetes mellitus with hyperglycemia: Secondary | ICD-10-CM | POA: Diagnosis not present

## 2021-11-03 DIAGNOSIS — L723 Sebaceous cyst: Secondary | ICD-10-CM | POA: Diagnosis not present

## 2021-11-18 DIAGNOSIS — Z125 Encounter for screening for malignant neoplasm of prostate: Secondary | ICD-10-CM | POA: Diagnosis not present

## 2021-11-18 DIAGNOSIS — E7849 Other hyperlipidemia: Secondary | ICD-10-CM | POA: Diagnosis not present

## 2021-11-18 DIAGNOSIS — R5383 Other fatigue: Secondary | ICD-10-CM | POA: Diagnosis not present

## 2021-11-18 DIAGNOSIS — K219 Gastro-esophageal reflux disease without esophagitis: Secondary | ICD-10-CM | POA: Diagnosis not present

## 2021-11-18 DIAGNOSIS — Z0001 Encounter for general adult medical examination with abnormal findings: Secondary | ICD-10-CM | POA: Diagnosis not present

## 2021-11-18 DIAGNOSIS — E1165 Type 2 diabetes mellitus with hyperglycemia: Secondary | ICD-10-CM | POA: Diagnosis not present

## 2021-11-18 DIAGNOSIS — E782 Mixed hyperlipidemia: Secondary | ICD-10-CM | POA: Diagnosis not present

## 2021-11-18 DIAGNOSIS — Z1159 Encounter for screening for other viral diseases: Secondary | ICD-10-CM | POA: Diagnosis not present

## 2021-11-22 DIAGNOSIS — Z1212 Encounter for screening for malignant neoplasm of rectum: Secondary | ICD-10-CM | POA: Diagnosis not present

## 2021-11-22 DIAGNOSIS — F411 Generalized anxiety disorder: Secondary | ICD-10-CM | POA: Diagnosis not present

## 2021-11-22 DIAGNOSIS — M545 Low back pain, unspecified: Secondary | ICD-10-CM | POA: Diagnosis not present

## 2021-11-22 DIAGNOSIS — I10 Essential (primary) hypertension: Secondary | ICD-10-CM | POA: Diagnosis not present

## 2021-11-22 DIAGNOSIS — E7849 Other hyperlipidemia: Secondary | ICD-10-CM | POA: Diagnosis not present

## 2021-11-22 DIAGNOSIS — F331 Major depressive disorder, recurrent, moderate: Secondary | ICD-10-CM | POA: Diagnosis not present

## 2021-11-22 DIAGNOSIS — Z79891 Long term (current) use of opiate analgesic: Secondary | ICD-10-CM | POA: Diagnosis not present

## 2021-11-22 DIAGNOSIS — Z0001 Encounter for general adult medical examination with abnormal findings: Secondary | ICD-10-CM | POA: Diagnosis not present

## 2022-01-05 DIAGNOSIS — Z8619 Personal history of other infectious and parasitic diseases: Secondary | ICD-10-CM | POA: Diagnosis not present

## 2022-01-05 DIAGNOSIS — Z800 Family history of malignant neoplasm of digestive organs: Secondary | ICD-10-CM | POA: Diagnosis not present

## 2022-01-05 DIAGNOSIS — B178 Other specified acute viral hepatitis: Secondary | ICD-10-CM | POA: Diagnosis not present

## 2022-01-05 DIAGNOSIS — R195 Other fecal abnormalities: Secondary | ICD-10-CM | POA: Diagnosis not present

## 2022-02-28 DIAGNOSIS — K760 Fatty (change of) liver, not elsewhere classified: Secondary | ICD-10-CM | POA: Diagnosis not present

## 2022-02-28 DIAGNOSIS — Z23 Encounter for immunization: Secondary | ICD-10-CM | POA: Diagnosis not present

## 2022-02-28 DIAGNOSIS — R4582 Worries: Secondary | ICD-10-CM | POA: Diagnosis not present

## 2022-02-28 DIAGNOSIS — I10 Essential (primary) hypertension: Secondary | ICD-10-CM | POA: Diagnosis not present

## 2022-02-28 DIAGNOSIS — E1165 Type 2 diabetes mellitus with hyperglycemia: Secondary | ICD-10-CM | POA: Diagnosis not present

## 2022-02-28 DIAGNOSIS — N486 Induration penis plastica: Secondary | ICD-10-CM | POA: Diagnosis not present

## 2022-02-28 DIAGNOSIS — L219 Seborrheic dermatitis, unspecified: Secondary | ICD-10-CM | POA: Diagnosis not present

## 2022-02-28 DIAGNOSIS — Z0001 Encounter for general adult medical examination with abnormal findings: Secondary | ICD-10-CM | POA: Diagnosis not present

## 2022-04-07 DIAGNOSIS — B178 Other specified acute viral hepatitis: Secondary | ICD-10-CM | POA: Diagnosis not present

## 2022-04-07 DIAGNOSIS — Z8619 Personal history of other infectious and parasitic diseases: Secondary | ICD-10-CM | POA: Diagnosis not present

## 2022-04-10 DIAGNOSIS — R195 Other fecal abnormalities: Secondary | ICD-10-CM | POA: Diagnosis not present

## 2022-04-10 DIAGNOSIS — Z800 Family history of malignant neoplasm of digestive organs: Secondary | ICD-10-CM | POA: Diagnosis not present

## 2022-04-10 DIAGNOSIS — D123 Benign neoplasm of transverse colon: Secondary | ICD-10-CM | POA: Diagnosis not present

## 2022-04-10 DIAGNOSIS — Z79899 Other long term (current) drug therapy: Secondary | ICD-10-CM | POA: Diagnosis not present

## 2022-04-10 DIAGNOSIS — E119 Type 2 diabetes mellitus without complications: Secondary | ICD-10-CM | POA: Diagnosis not present

## 2022-04-10 DIAGNOSIS — Z882 Allergy status to sulfonamides status: Secondary | ICD-10-CM | POA: Diagnosis not present

## 2022-04-10 DIAGNOSIS — F17220 Nicotine dependence, chewing tobacco, uncomplicated: Secondary | ICD-10-CM | POA: Diagnosis not present

## 2022-04-10 DIAGNOSIS — Z7982 Long term (current) use of aspirin: Secondary | ICD-10-CM | POA: Diagnosis not present

## 2022-04-10 DIAGNOSIS — I10 Essential (primary) hypertension: Secondary | ICD-10-CM | POA: Diagnosis not present

## 2022-04-25 DIAGNOSIS — D123 Benign neoplasm of transverse colon: Secondary | ICD-10-CM | POA: Diagnosis not present

## 2022-04-25 DIAGNOSIS — D122 Benign neoplasm of ascending colon: Secondary | ICD-10-CM | POA: Diagnosis not present

## 2022-05-02 ENCOUNTER — Encounter (INDEPENDENT_AMBULATORY_CARE_PROVIDER_SITE_OTHER): Payer: Self-pay | Admitting: *Deleted

## 2022-05-12 DIAGNOSIS — I10 Essential (primary) hypertension: Secondary | ICD-10-CM | POA: Diagnosis not present

## 2022-05-12 DIAGNOSIS — Z6828 Body mass index (BMI) 28.0-28.9, adult: Secondary | ICD-10-CM | POA: Diagnosis not present

## 2022-05-12 DIAGNOSIS — L03311 Cellulitis of abdominal wall: Secondary | ICD-10-CM | POA: Diagnosis not present

## 2022-05-30 DIAGNOSIS — I10 Essential (primary) hypertension: Secondary | ICD-10-CM | POA: Diagnosis not present

## 2022-05-30 DIAGNOSIS — E782 Mixed hyperlipidemia: Secondary | ICD-10-CM | POA: Diagnosis not present

## 2022-05-30 DIAGNOSIS — E1165 Type 2 diabetes mellitus with hyperglycemia: Secondary | ICD-10-CM | POA: Diagnosis not present

## 2022-05-30 DIAGNOSIS — K2100 Gastro-esophageal reflux disease with esophagitis, without bleeding: Secondary | ICD-10-CM | POA: Diagnosis not present

## 2022-05-30 DIAGNOSIS — E7849 Other hyperlipidemia: Secondary | ICD-10-CM | POA: Diagnosis not present

## 2022-05-30 DIAGNOSIS — Z23 Encounter for immunization: Secondary | ICD-10-CM | POA: Diagnosis not present

## 2022-06-02 DIAGNOSIS — E782 Mixed hyperlipidemia: Secondary | ICD-10-CM | POA: Diagnosis not present

## 2022-06-02 DIAGNOSIS — M1712 Unilateral primary osteoarthritis, left knee: Secondary | ICD-10-CM | POA: Diagnosis not present

## 2022-06-02 DIAGNOSIS — N486 Induration penis plastica: Secondary | ICD-10-CM | POA: Diagnosis not present

## 2022-06-02 DIAGNOSIS — M1711 Unilateral primary osteoarthritis, right knee: Secondary | ICD-10-CM | POA: Diagnosis not present

## 2022-06-02 DIAGNOSIS — F331 Major depressive disorder, recurrent, moderate: Secondary | ICD-10-CM | POA: Diagnosis not present

## 2022-06-02 DIAGNOSIS — Z79891 Long term (current) use of opiate analgesic: Secondary | ICD-10-CM | POA: Diagnosis not present

## 2022-06-02 DIAGNOSIS — F411 Generalized anxiety disorder: Secondary | ICD-10-CM | POA: Diagnosis not present

## 2022-06-02 DIAGNOSIS — E1165 Type 2 diabetes mellitus with hyperglycemia: Secondary | ICD-10-CM | POA: Diagnosis not present

## 2022-06-02 DIAGNOSIS — J301 Allergic rhinitis due to pollen: Secondary | ICD-10-CM | POA: Diagnosis not present

## 2022-06-02 DIAGNOSIS — M545 Low back pain, unspecified: Secondary | ICD-10-CM | POA: Diagnosis not present

## 2022-06-02 DIAGNOSIS — M25512 Pain in left shoulder: Secondary | ICD-10-CM | POA: Diagnosis not present

## 2022-06-02 DIAGNOSIS — L219 Seborrheic dermatitis, unspecified: Secondary | ICD-10-CM | POA: Diagnosis not present

## 2022-06-02 DIAGNOSIS — I10 Essential (primary) hypertension: Secondary | ICD-10-CM | POA: Diagnosis not present

## 2022-06-02 DIAGNOSIS — K760 Fatty (change of) liver, not elsewhere classified: Secondary | ICD-10-CM | POA: Diagnosis not present

## 2022-06-07 ENCOUNTER — Other Ambulatory Visit (INDEPENDENT_AMBULATORY_CARE_PROVIDER_SITE_OTHER): Payer: Self-pay

## 2022-06-07 DIAGNOSIS — Z86010 Personal history of colonic polyps: Secondary | ICD-10-CM

## 2022-06-12 ENCOUNTER — Encounter (INDEPENDENT_AMBULATORY_CARE_PROVIDER_SITE_OTHER): Payer: Self-pay | Admitting: *Deleted

## 2022-06-21 ENCOUNTER — Telehealth (INDEPENDENT_AMBULATORY_CARE_PROVIDER_SITE_OTHER): Payer: Self-pay

## 2022-06-21 ENCOUNTER — Encounter (INDEPENDENT_AMBULATORY_CARE_PROVIDER_SITE_OTHER): Payer: Self-pay

## 2022-06-21 NOTE — Telephone Encounter (Signed)
Referring MD/PCP: Olena Heckle  Procedure: Tcs w/bx  Reason/Indication:  hx of colon polyps  Has patient had this procedure before?  Yes   If so, when, by whom and where?  04/10/22   Is there a family history of colon cancer?  no  Who?  What age when diagnosed?    Is patient diabetic? If yes, Type 1 or Type 2   yes type 2       Does patient have prosthetic heart valve or mechanical valve?  no  Do you have a pacemaker/defibrillator?  no  Has patient ever had endocarditis/atrial fibrillation? no  Does patient use oxygen? no  Has patient had joint replacement within last 12 months?  no  Is patient constipated or do they take laxatives? Yes, sometimes  Does patient have a history of alcohol/drug use?  yes  Have you had a stroke/heart attack last 6 mths? no  Do you take medicine for weight loss?  No   For male patients,: have you had a hysterectomy n/a                      are you post menopausal n/a                      do you still have your menstrual cycle n/a  Is patient on blood thinner such as Coumadin, Plavix and/or Aspirin? yes  Medications: asa 81 mg daily, allopruinol 300 mg daily, metformin 500 mtg bid, esomeprazole 40 mg daily, citalopram 40 mg 1/2 tab daily, atorvastatin 20 mg daily, alprazolam 0.5 bid, hydrocodone 5/325 qid prn, levocetirizine '5mg'$  daily  Allergies: Sulfa Drugs  Medication Adjustment per Dr Jenetta Downer Hold metformin the evening prior and morning of procedure  Procedure date & time: 07/21/22 at 11:15

## 2022-06-21 NOTE — Telephone Encounter (Signed)
Ok to schedule.  Thanks,  Fay Bagg Castaneda Mayorga, MD Gastroenterology and Hepatology [facility]  

## 2022-06-26 ENCOUNTER — Telehealth (INDEPENDENT_AMBULATORY_CARE_PROVIDER_SITE_OTHER): Payer: Self-pay

## 2022-06-26 MED ORDER — PEG 3350-KCL-NA BICARB-NACL 420 G PO SOLR: 4000.0000 mL | ORAL | 0 refills | Status: DC

## 2022-06-26 NOTE — Telephone Encounter (Signed)
Raymond Roberson Raymond Roberson, CMA  ?

## 2022-07-05 ENCOUNTER — Encounter (INDEPENDENT_AMBULATORY_CARE_PROVIDER_SITE_OTHER): Payer: Self-pay

## 2022-07-17 NOTE — Patient Instructions (Signed)
   Your procedure is scheduled on: 07/21/2022  Report to [facility] at   9:00  AM.  Call this number if you have problems the morning of surgery: [phone]   Remember:              Follow Directions on the letter you received from Your Physician's office regarding the Bowel Prep              No Smoking the day of Procedure :   Take these medicines the morning of surgery with A SIP OF WATER: Celexa, nexium, and xanax              hydrocodone, and zofran if needed   Do not wear jewelry, make-up or nail polish.    Do not bring valuables to the hospital.  Contacts, dentures or bridgework may not be worn into surgery.  .   Patients discharged the day of surgery will not be allowed to drive home.     Colonoscopy, Adult, Care After This sheet gives you information about how to care for yourself after your procedure. Your health care provider may also give you more specific instructions. If you have problems or questions, contact your health care provider. What can I expect after the procedure? After the procedure, it is common to have: A small amount of blood in your stool for 24 hours after the procedure. Some gas. Mild abdominal cramping or bloating.  Follow these instructions at home: General instructions  For the first 24 hours after the procedure: Do not drive or use machinery. Do not sign important documents. Do not drink alcohol. Do your regular daily activities at a slower pace than normal. Eat soft, easy-to-digest foods. Rest often. Take over-the-counter or prescription medicines only as told by your health care provider. It is up to you to get the results of your procedure. Ask your health care provider, or the department performing the procedure, when your results will be ready. Relieving cramping and bloating Try walking around when you have cramps or feel bloated. Apply heat to your abdomen as told by your health care provider. Use a heat source that  your health care provider recommends, such as a moist heat pack or a heating pad. Place a towel between your skin and the heat source. Leave the heat on for 20-30 minutes. Remove the heat if your skin turns bright red. This is especially important if you are unable to feel pain, heat, or cold. You may have a greater risk of getting burned. Eating and drinking Drink enough fluid to keep your urine clear or pale yellow. Resume your normal diet as instructed by your health care provider. Avoid heavy or fried foods that are hard to digest. Avoid drinking alcohol for as long as instructed by your health care provider. Contact a health care provider if: You have blood in your stool 2-3 days after the procedure. Get help right away if: You have more than a small spotting of blood in your stool. You pass large blood clots in your stool. Your abdomen is swollen. You have nausea or vomiting. You have a fever. You have increasing abdominal pain that is not relieved with medicine. This information is not intended to replace advice given to you by your health care provider. Make sure you discuss any questions you have with your health care provider. Document Released: 07/25/2004 Document Revised: 09/04/2016 Document Reviewed: 02/22/2016 Elsevier Interactive Patient Education  [employer].

## 2022-07-19 ENCOUNTER — Encounter (HOSPITAL_COMMUNITY): Payer: Self-pay

## 2022-07-19 ENCOUNTER — Encounter (HOSPITAL_COMMUNITY)
Admission: RE | Admit: 2022-07-19 | Discharge: 2022-07-19 | Disposition: A | Discharge status: Home / Self Care | Payer: Medicare HMO | Source: Ambulatory Visit | Attending: Gastroenterology | Admitting: Gastroenterology

## 2022-07-19 VITALS — Ht 72.0 in | Wt 220.0 lb

## 2022-07-19 DIAGNOSIS — Z860100 Personal history of colon polyps, unspecified: Secondary | ICD-10-CM

## 2022-07-19 DIAGNOSIS — I10 Essential (primary) hypertension: Secondary | ICD-10-CM

## 2022-07-19 DIAGNOSIS — Z01818 Encounter for other preprocedural examination: Secondary | ICD-10-CM | POA: Insufficient documentation

## 2022-07-19 HISTORY — DX: Type 2 diabetes mellitus without complications: E11.9

## 2022-07-21 ENCOUNTER — Ambulatory Visit (HOSPITAL_COMMUNITY)
Admission: RE | Admit: 2022-07-21 | Discharge: 2022-07-21 | Disposition: A | Discharge status: Home / Self Care | Payer: Medicare HMO | Attending: Gastroenterology | Admitting: Gastroenterology

## 2022-07-21 ENCOUNTER — Ambulatory Visit (HOSPITAL_COMMUNITY): Payer: Medicare HMO | Admitting: Anesthesiology

## 2022-07-21 ENCOUNTER — Ambulatory Visit (HOSPITAL_BASED_OUTPATIENT_CLINIC_OR_DEPARTMENT_OTHER): Payer: Medicare HMO | Admitting: Anesthesiology

## 2022-07-21 ENCOUNTER — Other Ambulatory Visit: Payer: Self-pay

## 2022-07-21 ENCOUNTER — Encounter (HOSPITAL_COMMUNITY)
Admission: RE | Disposition: A | Discharge status: Home / Self Care | Payer: Self-pay | Source: Home / Self Care | Attending: Gastroenterology

## 2022-07-21 DIAGNOSIS — F32A Depression, unspecified: Secondary | ICD-10-CM | POA: Diagnosis not present

## 2022-07-21 DIAGNOSIS — D122 Benign neoplasm of ascending colon: Secondary | ICD-10-CM | POA: Diagnosis not present

## 2022-07-21 DIAGNOSIS — D126 Benign neoplasm of colon, unspecified: Secondary | ICD-10-CM

## 2022-07-21 DIAGNOSIS — Z7984 Long term (current) use of oral hypoglycemic drugs: Secondary | ICD-10-CM | POA: Diagnosis not present

## 2022-07-21 DIAGNOSIS — I10 Essential (primary) hypertension: Secondary | ICD-10-CM | POA: Insufficient documentation

## 2022-07-21 DIAGNOSIS — K219 Gastro-esophageal reflux disease without esophagitis: Secondary | ICD-10-CM | POA: Diagnosis not present

## 2022-07-21 DIAGNOSIS — Z09 Encounter for follow-up examination after completed treatment for conditions other than malignant neoplasm: Secondary | ICD-10-CM | POA: Insufficient documentation

## 2022-07-21 DIAGNOSIS — E119 Type 2 diabetes mellitus without complications: Secondary | ICD-10-CM

## 2022-07-21 DIAGNOSIS — K635 Polyp of colon: Secondary | ICD-10-CM | POA: Diagnosis not present

## 2022-07-21 DIAGNOSIS — D123 Benign neoplasm of transverse colon: Secondary | ICD-10-CM | POA: Diagnosis not present

## 2022-07-21 DIAGNOSIS — K625 Hemorrhage of anus and rectum: Secondary | ICD-10-CM | POA: Diagnosis not present

## 2022-07-21 DIAGNOSIS — G4730 Sleep apnea, unspecified: Secondary | ICD-10-CM | POA: Insufficient documentation

## 2022-07-21 DIAGNOSIS — K648 Other hemorrhoids: Secondary | ICD-10-CM | POA: Insufficient documentation

## 2022-07-21 DIAGNOSIS — D124 Benign neoplasm of descending colon: Secondary | ICD-10-CM | POA: Diagnosis not present

## 2022-07-21 DIAGNOSIS — Z86010 Personal history of colonic polyps: Secondary | ICD-10-CM

## 2022-07-21 DIAGNOSIS — Z79899 Other long term (current) drug therapy: Secondary | ICD-10-CM | POA: Insufficient documentation

## 2022-07-21 DIAGNOSIS — Z1211 Encounter for screening for malignant neoplasm of colon: Secondary | ICD-10-CM | POA: Diagnosis not present

## 2022-07-21 HISTORY — PX: POLYPECTOMY: SHX5525

## 2022-07-21 HISTORY — PX: BIOPSY: SHX5522

## 2022-07-21 HISTORY — PX: COLONOSCOPY WITH PROPOFOL: SHX5780

## 2022-07-21 LAB — HM COLONOSCOPY

## 2022-07-21 LAB — GLUCOSE, CAPILLARY: Glucose-Capillary: 90 mg/dL (ref 70–99)

## 2022-07-21 SURGERY — COLONOSCOPY WITH PROPOFOL
Anesthesia: General

## 2022-07-21 MED ORDER — PROPOFOL 10 MG/ML IV BOLUS
INTRAVENOUS | Status: DC | PRN
  Administered 2022-07-21 (×2): 20 mg via INTRAVENOUS
  Administered 2022-07-21: 100 mg via INTRAVENOUS
  Administered 2022-07-21: 20 mg via INTRAVENOUS
  Administered 2022-07-21: 50 mg via INTRAVENOUS
  Administered 2022-07-21 (×2): 20 mg via INTRAVENOUS

## 2022-07-21 MED ORDER — LACTATED RINGERS IV SOLN: INTRAVENOUS | Status: DC | PRN

## 2022-07-21 MED ORDER — LACTATED RINGERS IV SOLN
INTRAVENOUS | Status: DC
Start: 2022-07-21 — End: 2022-07-21

## 2022-07-21 MED ORDER — PROPOFOL 10 MG/ML IV BOLUS
INTRAVENOUS | Status: AC
  Filled 2022-07-21: qty 20

## 2022-07-21 MED ORDER — PROPOFOL 500 MG/50ML IV EMUL
INTRAVENOUS | Status: AC
  Filled 2022-07-21: qty 200

## 2022-07-21 NOTE — Discharge Instructions (Addendum)
You are being discharged to home.  Resume your previous diet.  We are waiting for your pathology results.  Your physician has recommended a repeat colonoscopy in three years for surveillance.  

## 2022-07-21 NOTE — Anesthesia Preprocedure Evaluation (Signed)
Anesthesia Evaluation  Patient identified by MRN, date of birth, ID band Patient awake    Reviewed: Allergy & Precautions, H&P , NPO status , Patient's Chart, lab work & pertinent test results, reviewed documented beta blocker date and time   Airway Mallampati: II  TM Distance: >3 FB Neck ROM: full    Dental no notable dental hx.    Pulmonary sleep apnea ,    Pulmonary exam normal breath sounds clear to auscultation       Cardiovascular Exercise Tolerance: Good hypertension, negative cardio ROS   Rhythm:regular Rate:Normal     Neuro/Psych  Headaches, PSYCHIATRIC DISORDERS Depression CVA    GI/Hepatic PUD, GERD  ,(+)     substance abuse  alcohol use,   Endo/Other  negative endocrine ROSdiabetes, Type 2  Renal/GU negative Renal ROS  negative genitourinary   Musculoskeletal   Abdominal   Peds  Hematology negative hematology ROS (+)   Anesthesia Other Findings   Reproductive/Obstetrics negative OB ROS                             Anesthesia Physical Anesthesia Plan  ASA: 3  Anesthesia Plan: General   Post-op Pain Management:    Induction:   PONV Risk Score and Plan: Propofol infusion  Airway Management Planned:   Additional Equipment:   Intra-op Plan:   Post-operative Plan:   Informed Consent: I have reviewed the patients History and Physical, chart, labs and discussed the procedure including the risks, benefits and alternatives for the proposed anesthesia with the patient or authorized representative who has indicated his/her understanding and acceptance.     Dental Advisory Given  Plan Discussed with: CRNA  Anesthesia Plan Comments:         Anesthesia Quick Evaluation

## 2022-07-21 NOTE — Op Note (Signed)
[facility] Patient Name: Raymond Roberson Procedure Date: 07/21/2022 10:54 AM MRN: [id number] Date of Birth: 21-Feb-1956 Attending MD: Maylon Peppers ,  CSN: [id number] Age: 66 Admit Type: Outpatient Procedure:                Colonoscopy Indications:              Surveillance: Personal history of incomplete                            removal of sessile adenoma on last colonoscopy                            (less than 1 year ago) Providers:                Maylon Peppers, Lambert Mody, Angela A.                            Insurance claims handler, Therapist, sports, Suzan Garibaldi. Risa Grill, Technician Referring MD:              Medicines:                Monitored Anesthesia Care Complications:            No immediate complications. Estimated Blood Loss:     Estimated blood loss: none. Procedure:                Pre-Anesthesia Assessment:                           - Prior to the procedure, a History and Physical                            was performed, and patient medications, allergies                            and sensitivities were reviewed. The patient's                            tolerance of previous anesthesia was reviewed.                           - The risks and benefits of the procedure and the                            sedation options and risks were discussed with the                            patient. All questions were answered and informed                            consent was obtained.                           - ASA Grade Assessment: II - A patient with mild  systemic disease.                           After obtaining informed consent, the colonoscope                            was passed under direct vision. Throughout the                            procedure, the patient's blood pressure, pulse, and                            oxygen saturations were monitored continuously. The                            [id number]-HQ190L ([id number]) scope was introduced through                             the anus and advanced to the the cecum, identified                            by appendiceal orifice and ileocecal valve. The                            colonoscopy was performed without difficulty. The                            patient tolerated the procedure well. The quality                            of the bowel preparation was good. Scope In: 11:09:26 AM Scope Out: 11:39:32 AM Scope Withdrawal Time: 0 hours 20 minutes 33 seconds  Total Procedure Duration: 0 hours 30 minutes 6 seconds  Findings:      The perianal and digital rectal examinations were normal.      Three sessile polyps were found in the transverse colon and ascending       colon. The polyps were 3 to 5 mm in size. These polyps were removed with       a cold snare. Resection and retrieval were complete.      A 2 mm polyp was found in the descending colon. The polyp was sessile.       The polyp was removed with a cold snare. Resection and retrieval were       complete.      A 1 mm polyp was found in the descending colon. The polyp was sessile.       The polyp was removed with a cold biopsy forceps. Resection and       retrieval were complete.      Non-bleeding internal hemorrhoids were found during retroflexion. The       hemorrhoids were small. Impression:               - Three 3 to 5 mm polyps in the transverse colon                            and in the ascending  colon, removed with a cold                            snare. Resected and retrieved.                           - One 2 mm polyp in the descending colon, removed                            with a cold snare. Resected and retrieved.                           - One 1 mm polyp in the descending colon, removed                            with a cold biopsy forceps. Resected and retrieved.                           - Non-bleeding internal hemorrhoids. Moderate Sedation:      Per Anesthesia Care Recommendation:           - Discharge  patient to home (ambulatory).                           - Resume previous diet.                           - Await pathology results.                           - Repeat colonoscopy in 3 years for surveillance. Procedure Code(s):        --- Professional ---                           [phone], Colonoscopy, flexible; with removal of                            tumor(s), polyp(s), or other lesion(s) by snare                            technique                           45380, 29, Colonoscopy, flexible; with biopsy,                            single or multiple Diagnosis Code(s):        --- Professional ---                           K63.5, Polyp of colon                           K64.8, Other hemorrhoids                           D12.6, Benign neoplasm of colon, unspecified  CPT copyright 2019 American Medical Association. All rights reserved. The codes documented in this report are preliminary and upon coder review may  be revised to meet current compliance requirements. Maylon Peppers, MD Maylon Peppers,  07/21/2022 11:47:07 AM This report has been signed electronically. Number of Addenda: 0

## 2022-07-21 NOTE — Transfer of Care (Signed)
Immediate Anesthesia Transfer of Care Note  Patient: Raymond Roberson  Procedure(s) Performed: COLONOSCOPY WITH PROPOFOL POLYPECTOMY BIOPSY  Patient Location: Short Stay  Anesthesia Type:General  Level of Consciousness: awake and alert   Airway & Oxygen Therapy: Patient Spontanous Breathing  Post-op Assessment: Report given to RN and Post -op Vital signs reviewed and stable  Post vital signs: Reviewed and stable  Last Vitals:  Vitals Value Taken Time  BP 133/93 07/21/22 1144  Temp 36.8 C 07/21/22 1144  Pulse 77 07/21/22 1144  Resp 18 07/21/22 1144  SpO2 94 % 07/21/22 1144    Last Pain:  Vitals:   07/21/22 1144  TempSrc: Oral  PainSc: 0-No pain      Patients Stated Pain Goal: 7 (03/52/48 1859)  Complications: No notable events documented.

## 2022-07-21 NOTE — H&P (Signed)
Raymond Roberson is an 66 y.o. male.   Chief Complaint: history of colonic polyps and incompletely removed colon polyp. HPI: 66 y/o male with medical history of CVA, diabetes, hypertension, gout, depression, sleep apnea, peptic ulcer disease, bicuspid aortic valve and chronic alcohol abuse, who comes for evaluation of history of colonic polyps incompletely removed, 1 polyp.  Patient underwent a colonoscopy by Dr. Rocco Serene in April 2023, he had 2 polyps removed in the transverse and splenic flexure (tubular adenoma.  There was one polyp in the hepatic flexure which could not be removed.  The patient denies having any nausea, vomiting, fever, chills, hematochezia, melena, hematemesis, abdominal distention, abdominal pain, diarrhea, jaundice, pruritus or weight loss.  Past Medical History:  Diagnosis Date   Acute angina ([place])    Bicuspid aortic valve    Chronic alcohol abuse    Classic migraine 09/02/2013   CVA (cerebral vascular accident) ([place])    Diabetes mellitus without complication ([place])    Dizziness and giddiness 09/02/2013   GERD (gastroesophageal reflux disease)    Gouty arthritis    Hypertension    Major depression    Other and unspecified hyperlipidemia    Peptic ulcer disease    Sleep apnea     Past Surgical History:  Procedure Laterality Date   CIRCUMCISION     CYST REMOVAL TRUNK  11/24/2012   back   INGUINAL HERNIA REPAIR Bilateral    RHINOPLASTY      Family History  Problem Relation Age of Onset   CAD Father    Heart failure Father    Allergies Mother    Anxiety disorder Mother    Social History:  reports that he has never smoked. His smokeless tobacco use includes snuff. He reports that he does not currently use alcohol. He reports that he does not use drugs.  Allergies:  Allergies  Allergen Reactions   Naproxen Nausea Only   Sulfa Antibiotics Nausea Only    Medications Prior to Admission  Medication Sig Dispense Refill   allopurinol (ZYLOPRIM) 300 MG  tablet Take 300 mg by mouth daily.     ALPRAZolam (XANAX) 0.5 MG tablet Take 0.5 mg by mouth 2 (two) times daily.     aspirin 81 MG tablet Take 81 mg by mouth daily.     atorvastatin (LIPITOR) 20 MG tablet Take 20 mg by mouth daily.     citalopram (CELEXA) 40 MG tablet Take 20 mg by mouth daily.     cycloSPORINE (RESTASIS) 0.05 % ophthalmic emulsion Place 1 drop into both eyes daily.     esomeprazole (NEXIUM) 40 MG capsule Take 40 mg by mouth daily.     HYDROcodone-acetaminophen (NORCO/VICODIN) 5-325 MG tablet Take 1 tablet by mouth 3 (three) times daily.     hydrocortisone (ANUSOL-HC) 2.5 % rectal cream Place 1 application  rectally daily as needed for hemorrhoids or anal itching.     levocetirizine (XYZAL) 5 MG tablet Take 5 mg by mouth daily.     metFORMIN (GLUCOPHAGE-XR) 500 MG 24 hr tablet Take 1,000 mg by mouth daily.     Multiple Vitamins-Minerals (MULTIVITAMIN WITH MINERALS) tablet Take 1 tablet by mouth daily.     ondansetron (ZOFRAN) 8 MG tablet Take 8 mg by mouth once a week.     Polyvinyl Alcohol-Povidone (REFRESH OP) Place 1 drop into both eyes daily as needed (Dry eyes).     polyethylene glycol-electrolytes (TRILYTE) 420 g solution Take 4,000 mLs by mouth as directed. 4000 mL 0  Results for orders placed or performed during the hospital encounter of 07/21/22 (from the past 48 hour(s))  Glucose, capillary     Status: None   Collection Time: 07/21/22  9:55 AM  Result Value Ref Range   Glucose-Capillary 90 70 - 99 mg/dL    Comment: Glucose reference range applies only to samples taken after fasting for at least 8 hours.   No results found.  Review of Systems  All other systems reviewed and are negative.   Blood pressure (!) 170/88, pulse 66, temperature 98.8 F (37.1 C), temperature source Oral, resp. rate 12, SpO2 95 %. Physical Exam  GENERAL: The patient is AO x3, in no acute distress. HEENT: Head is normocephalic and atraumatic. EOMI are intact. Mouth is well  hydrated and without lesions. NECK: Supple. No masses LUNGS: Clear to auscultation. No presence of rhonchi/wheezing/rales. Adequate chest expansion HEART: RRR, normal s1 and s2. ABDOMEN: Soft, nontender, no guarding, no peritoneal signs, and nondistended. BS +. No masses. EXTREMITIES: Without any cyanosis, clubbing, rash, lesions or edema. NEUROLOGIC: AOx3, no focal motor deficit. SKIN: no jaundice, no rashes  Assessment/Plan 66 y/o male with medical history of CVA, diabetes, hypertension, gout, depression, sleep apnea, peptic ulcer disease, bicuspid aortic valve and chronic alcohol abuse, who comes for evaluation of history of colonic polyps incompletely removed.  We will proceed with colonoscopy.  Harvel Quale, MD 07/21/2022, 10:30 AM

## 2022-07-23 NOTE — Anesthesia Postprocedure Evaluation (Signed)
Anesthesia Post Note  Patient: Raymond Roberson  Procedure(s) Performed: COLONOSCOPY WITH PROPOFOL POLYPECTOMY BIOPSY  Patient location during evaluation: Phase II Anesthesia Type: General Level of consciousness: awake Pain management: pain level controlled Vital Signs Assessment: post-procedure vital signs reviewed and stable Respiratory status: spontaneous breathing and respiratory function stable Cardiovascular status: blood pressure returned to baseline and stable Postop Assessment: no headache and no apparent nausea or vomiting Anesthetic complications: no Comments: Late entry   No notable events documented.   Last Vitals:  Vitals:   07/21/22 1004 07/21/22 1144  BP: (!) 170/88 (!) 133/93  Pulse: 66 77  Resp: 12 18  Temp: 37.1 C 36.8 C  SpO2: 95% 94%    Last Pain:  Vitals:   07/21/22 1144  TempSrc: Oral  PainSc: 0-No pain                 Louann Sjogren

## 2022-07-24 ENCOUNTER — Encounter (INDEPENDENT_AMBULATORY_CARE_PROVIDER_SITE_OTHER): Payer: Self-pay | Admitting: *Deleted

## 2022-07-24 LAB — SURGICAL PATHOLOGY

## 2022-07-26 ENCOUNTER — Encounter (HOSPITAL_COMMUNITY): Payer: Self-pay | Admitting: Gastroenterology

## 2022-09-08 DIAGNOSIS — Z1329 Encounter for screening for other suspected endocrine disorder: Secondary | ICD-10-CM | POA: Diagnosis not present

## 2022-09-08 DIAGNOSIS — E1165 Type 2 diabetes mellitus with hyperglycemia: Secondary | ICD-10-CM | POA: Diagnosis not present

## 2022-09-08 DIAGNOSIS — I10 Essential (primary) hypertension: Secondary | ICD-10-CM | POA: Diagnosis not present

## 2022-09-08 DIAGNOSIS — E7849 Other hyperlipidemia: Secondary | ICD-10-CM | POA: Diagnosis not present

## 2022-09-08 DIAGNOSIS — E782 Mixed hyperlipidemia: Secondary | ICD-10-CM | POA: Diagnosis not present

## 2022-09-08 DIAGNOSIS — R5383 Other fatigue: Secondary | ICD-10-CM | POA: Diagnosis not present

## 2022-09-14 DIAGNOSIS — M25512 Pain in left shoulder: Secondary | ICD-10-CM | POA: Diagnosis not present

## 2022-09-14 DIAGNOSIS — E7849 Other hyperlipidemia: Secondary | ICD-10-CM | POA: Diagnosis not present

## 2022-09-14 DIAGNOSIS — N486 Induration penis plastica: Secondary | ICD-10-CM | POA: Diagnosis not present

## 2022-09-14 DIAGNOSIS — I10 Essential (primary) hypertension: Secondary | ICD-10-CM | POA: Diagnosis not present

## 2022-09-14 DIAGNOSIS — E1165 Type 2 diabetes mellitus with hyperglycemia: Secondary | ICD-10-CM | POA: Diagnosis not present

## 2022-09-14 DIAGNOSIS — L219 Seborrheic dermatitis, unspecified: Secondary | ICD-10-CM | POA: Diagnosis not present

## 2022-09-14 DIAGNOSIS — F411 Generalized anxiety disorder: Secondary | ICD-10-CM | POA: Diagnosis not present

## 2022-09-14 DIAGNOSIS — Z79891 Long term (current) use of opiate analgesic: Secondary | ICD-10-CM | POA: Diagnosis not present

## 2022-09-14 DIAGNOSIS — K760 Fatty (change of) liver, not elsewhere classified: Secondary | ICD-10-CM | POA: Diagnosis not present

## 2022-11-27 DIAGNOSIS — R030 Elevated blood-pressure reading, without diagnosis of hypertension: Secondary | ICD-10-CM | POA: Diagnosis not present

## 2022-11-27 DIAGNOSIS — J069 Acute upper respiratory infection, unspecified: Secondary | ICD-10-CM | POA: Diagnosis not present

## 2022-11-27 DIAGNOSIS — J029 Acute pharyngitis, unspecified: Secondary | ICD-10-CM | POA: Diagnosis not present

## 2022-11-27 DIAGNOSIS — J449 Chronic obstructive pulmonary disease, unspecified: Secondary | ICD-10-CM | POA: Diagnosis not present

## 2022-12-07 DIAGNOSIS — N486 Induration penis plastica: Secondary | ICD-10-CM | POA: Diagnosis not present

## 2022-12-07 DIAGNOSIS — Z125 Encounter for screening for malignant neoplasm of prostate: Secondary | ICD-10-CM | POA: Diagnosis not present

## 2022-12-07 DIAGNOSIS — Z79891 Long term (current) use of opiate analgesic: Secondary | ICD-10-CM | POA: Diagnosis not present

## 2022-12-07 DIAGNOSIS — E782 Mixed hyperlipidemia: Secondary | ICD-10-CM | POA: Diagnosis not present

## 2022-12-07 DIAGNOSIS — Z1329 Encounter for screening for other suspected endocrine disorder: Secondary | ICD-10-CM | POA: Diagnosis not present

## 2022-12-07 DIAGNOSIS — E1165 Type 2 diabetes mellitus with hyperglycemia: Secondary | ICD-10-CM | POA: Diagnosis not present

## 2022-12-07 DIAGNOSIS — K219 Gastro-esophageal reflux disease without esophagitis: Secondary | ICD-10-CM | POA: Diagnosis not present

## 2022-12-07 DIAGNOSIS — E7849 Other hyperlipidemia: Secondary | ICD-10-CM | POA: Diagnosis not present

## 2023-01-05 DIAGNOSIS — F411 Generalized anxiety disorder: Secondary | ICD-10-CM | POA: Diagnosis not present

## 2023-01-05 DIAGNOSIS — Z1159 Encounter for screening for other viral diseases: Secondary | ICD-10-CM | POA: Diagnosis not present

## 2023-01-05 DIAGNOSIS — I10 Essential (primary) hypertension: Secondary | ICD-10-CM | POA: Diagnosis not present

## 2023-01-05 DIAGNOSIS — E1165 Type 2 diabetes mellitus with hyperglycemia: Secondary | ICD-10-CM | POA: Diagnosis not present

## 2023-01-05 DIAGNOSIS — J301 Allergic rhinitis due to pollen: Secondary | ICD-10-CM | POA: Diagnosis not present

## 2023-01-05 DIAGNOSIS — K760 Fatty (change of) liver, not elsewhere classified: Secondary | ICD-10-CM | POA: Diagnosis not present

## 2023-01-05 DIAGNOSIS — N486 Induration penis plastica: Secondary | ICD-10-CM | POA: Diagnosis not present

## 2023-01-05 DIAGNOSIS — M25512 Pain in left shoulder: Secondary | ICD-10-CM | POA: Diagnosis not present

## 2023-01-05 DIAGNOSIS — E7849 Other hyperlipidemia: Secondary | ICD-10-CM | POA: Diagnosis not present

## 2023-01-05 DIAGNOSIS — E782 Mixed hyperlipidemia: Secondary | ICD-10-CM | POA: Diagnosis not present

## 2023-01-05 DIAGNOSIS — F331 Major depressive disorder, recurrent, moderate: Secondary | ICD-10-CM | POA: Diagnosis not present

## 2023-01-05 DIAGNOSIS — Z0001 Encounter for general adult medical examination with abnormal findings: Secondary | ICD-10-CM | POA: Diagnosis not present

## 2023-01-05 DIAGNOSIS — Z79891 Long term (current) use of opiate analgesic: Secondary | ICD-10-CM | POA: Diagnosis not present

## 2023-01-05 DIAGNOSIS — L219 Seborrheic dermatitis, unspecified: Secondary | ICD-10-CM | POA: Diagnosis not present

## 2023-01-05 DIAGNOSIS — M1711 Unilateral primary osteoarthritis, right knee: Secondary | ICD-10-CM | POA: Diagnosis not present

## 2023-01-05 DIAGNOSIS — M1712 Unilateral primary osteoarthritis, left knee: Secondary | ICD-10-CM | POA: Diagnosis not present

## 2023-01-18 ENCOUNTER — Encounter (HOSPITAL_COMMUNITY): Payer: Self-pay

## 2023-01-18 DIAGNOSIS — I10 Essential (primary) hypertension: Secondary | ICD-10-CM | POA: Diagnosis not present

## 2023-01-18 DIAGNOSIS — R0602 Shortness of breath: Secondary | ICD-10-CM | POA: Diagnosis not present

## 2023-01-18 DIAGNOSIS — Z7984 Long term (current) use of oral hypoglycemic drugs: Secondary | ICD-10-CM | POA: Diagnosis not present

## 2023-01-18 DIAGNOSIS — Z1152 Encounter for screening for COVID-19: Secondary | ICD-10-CM | POA: Diagnosis not present

## 2023-01-18 DIAGNOSIS — R0600 Dyspnea, unspecified: Secondary | ICD-10-CM | POA: Diagnosis not present

## 2023-01-18 DIAGNOSIS — Z20822 Contact with and (suspected) exposure to covid-19: Secondary | ICD-10-CM | POA: Diagnosis not present

## 2023-01-18 DIAGNOSIS — M549 Dorsalgia, unspecified: Secondary | ICD-10-CM | POA: Diagnosis not present

## 2023-01-18 DIAGNOSIS — G8929 Other chronic pain: Secondary | ICD-10-CM | POA: Diagnosis not present

## 2023-01-18 DIAGNOSIS — I214 Non-ST elevation (NSTEMI) myocardial infarction: Secondary | ICD-10-CM | POA: Diagnosis not present

## 2023-01-18 DIAGNOSIS — E119 Type 2 diabetes mellitus without complications: Secondary | ICD-10-CM | POA: Diagnosis not present

## 2023-01-18 DIAGNOSIS — Z87891 Personal history of nicotine dependence: Secondary | ICD-10-CM | POA: Diagnosis not present

## 2023-01-19 ENCOUNTER — Inpatient Hospital Stay (HOSPITAL_COMMUNITY)
Admission: EM | Admit: 2023-01-19 | Discharge: 2023-01-27 | DRG: 216 | Disposition: A | Discharge status: Home / Self Care | Payer: Medicare HMO | Attending: Surgery | Admitting: Surgery

## 2023-01-19 ENCOUNTER — Other Ambulatory Visit: Payer: Self-pay

## 2023-01-19 ENCOUNTER — Emergency Department (HOSPITAL_COMMUNITY): Payer: Medicare HMO

## 2023-01-19 ENCOUNTER — Encounter (HOSPITAL_COMMUNITY): Payer: Self-pay

## 2023-01-19 DIAGNOSIS — I10 Essential (primary) hypertension: Secondary | ICD-10-CM | POA: Insufficient documentation

## 2023-01-19 DIAGNOSIS — I509 Heart failure, unspecified: Secondary | ICD-10-CM | POA: Diagnosis not present

## 2023-01-19 DIAGNOSIS — Z720 Tobacco use: Secondary | ICD-10-CM

## 2023-01-19 DIAGNOSIS — I25119 Atherosclerotic heart disease of native coronary artery with unspecified angina pectoris: Secondary | ICD-10-CM | POA: Diagnosis not present

## 2023-01-19 DIAGNOSIS — Z955 Presence of coronary angioplasty implant and graft: Secondary | ICD-10-CM

## 2023-01-19 DIAGNOSIS — R079 Chest pain, unspecified: Secondary | ICD-10-CM

## 2023-01-19 DIAGNOSIS — J90 Pleural effusion, not elsewhere classified: Secondary | ICD-10-CM | POA: Diagnosis not present

## 2023-01-19 DIAGNOSIS — J811 Chronic pulmonary edema: Secondary | ICD-10-CM | POA: Diagnosis not present

## 2023-01-19 DIAGNOSIS — R0602 Shortness of breath: Secondary | ICD-10-CM

## 2023-01-19 DIAGNOSIS — I472 Ventricular tachycardia, unspecified: Secondary | ICD-10-CM | POA: Diagnosis not present

## 2023-01-19 DIAGNOSIS — Z951 Presence of aortocoronary bypass graft: Secondary | ICD-10-CM

## 2023-01-19 DIAGNOSIS — I255 Ischemic cardiomyopathy: Secondary | ICD-10-CM | POA: Diagnosis present

## 2023-01-19 DIAGNOSIS — Z1152 Encounter for screening for COVID-19: Secondary | ICD-10-CM

## 2023-01-19 DIAGNOSIS — I25110 Atherosclerotic heart disease of native coronary artery with unstable angina pectoris: Secondary | ICD-10-CM | POA: Diagnosis present

## 2023-01-19 DIAGNOSIS — I493 Ventricular premature depolarization: Secondary | ICD-10-CM | POA: Diagnosis present

## 2023-01-19 DIAGNOSIS — Z8673 Personal history of transient ischemic attack (TIA), and cerebral infarction without residual deficits: Secondary | ICD-10-CM | POA: Diagnosis not present

## 2023-01-19 DIAGNOSIS — I214 Non-ST elevation (NSTEMI) myocardial infarction: Principal | ICD-10-CM | POA: Diagnosis present

## 2023-01-19 DIAGNOSIS — Q231 Congenital insufficiency of aortic valve: Secondary | ICD-10-CM

## 2023-01-19 DIAGNOSIS — E876 Hypokalemia: Secondary | ICD-10-CM | POA: Diagnosis present

## 2023-01-19 DIAGNOSIS — Z7982 Long term (current) use of aspirin: Secondary | ICD-10-CM

## 2023-01-19 DIAGNOSIS — Z952 Presence of prosthetic heart valve: Secondary | ICD-10-CM | POA: Diagnosis not present

## 2023-01-19 DIAGNOSIS — R634 Abnormal weight loss: Secondary | ICD-10-CM | POA: Diagnosis not present

## 2023-01-19 DIAGNOSIS — Z8249 Family history of ischemic heart disease and other diseases of the circulatory system: Secondary | ICD-10-CM

## 2023-01-19 DIAGNOSIS — Z01810 Encounter for preprocedural cardiovascular examination: Secondary | ICD-10-CM | POA: Diagnosis not present

## 2023-01-19 DIAGNOSIS — J9811 Atelectasis: Secondary | ICD-10-CM | POA: Diagnosis not present

## 2023-01-19 DIAGNOSIS — I358 Other nonrheumatic aortic valve disorders: Secondary | ICD-10-CM | POA: Diagnosis not present

## 2023-01-19 DIAGNOSIS — K219 Gastro-esophageal reflux disease without esophagitis: Secondary | ICD-10-CM | POA: Diagnosis present

## 2023-01-19 DIAGNOSIS — I5023 Acute on chronic systolic (congestive) heart failure: Secondary | ICD-10-CM | POA: Diagnosis present

## 2023-01-19 DIAGNOSIS — R0789 Other chest pain: Secondary | ICD-10-CM | POA: Diagnosis not present

## 2023-01-19 DIAGNOSIS — G4730 Sleep apnea, unspecified: Secondary | ICD-10-CM | POA: Diagnosis present

## 2023-01-19 DIAGNOSIS — I110 Hypertensive heart disease with heart failure: Secondary | ICD-10-CM | POA: Diagnosis not present

## 2023-01-19 DIAGNOSIS — Z886 Allergy status to analgesic agent status: Secondary | ICD-10-CM | POA: Diagnosis not present

## 2023-01-19 DIAGNOSIS — E119 Type 2 diabetes mellitus without complications: Secondary | ICD-10-CM | POA: Diagnosis not present

## 2023-01-19 DIAGNOSIS — D62 Acute posthemorrhagic anemia: Secondary | ICD-10-CM | POA: Diagnosis not present

## 2023-01-19 DIAGNOSIS — Z79891 Long term (current) use of opiate analgesic: Secondary | ICD-10-CM

## 2023-01-19 DIAGNOSIS — S2231XA Fracture of one rib, right side, initial encounter for closed fracture: Secondary | ICD-10-CM | POA: Diagnosis not present

## 2023-01-19 DIAGNOSIS — I5043 Acute on chronic combined systolic (congestive) and diastolic (congestive) heart failure: Secondary | ICD-10-CM | POA: Diagnosis not present

## 2023-01-19 DIAGNOSIS — Z6824 Body mass index (BMI) 24.0-24.9, adult: Secondary | ICD-10-CM

## 2023-01-19 DIAGNOSIS — F329 Major depressive disorder, single episode, unspecified: Secondary | ICD-10-CM | POA: Diagnosis not present

## 2023-01-19 DIAGNOSIS — I350 Nonrheumatic aortic (valve) stenosis: Secondary | ICD-10-CM | POA: Diagnosis not present

## 2023-01-19 DIAGNOSIS — M109 Gout, unspecified: Secondary | ICD-10-CM | POA: Diagnosis present

## 2023-01-19 DIAGNOSIS — I2510 Atherosclerotic heart disease of native coronary artery without angina pectoris: Secondary | ICD-10-CM | POA: Diagnosis not present

## 2023-01-19 DIAGNOSIS — Z79899 Other long term (current) drug therapy: Secondary | ICD-10-CM | POA: Diagnosis not present

## 2023-01-19 DIAGNOSIS — F419 Anxiety disorder, unspecified: Secondary | ICD-10-CM | POA: Diagnosis present

## 2023-01-19 DIAGNOSIS — I5021 Acute systolic (congestive) heart failure: Secondary | ICD-10-CM | POA: Diagnosis not present

## 2023-01-19 DIAGNOSIS — R570 Cardiogenic shock: Secondary | ICD-10-CM | POA: Diagnosis not present

## 2023-01-19 DIAGNOSIS — I083 Combined rheumatic disorders of mitral, aortic and tricuspid valves: Secondary | ICD-10-CM | POA: Diagnosis not present

## 2023-01-19 DIAGNOSIS — Z01818 Encounter for other preprocedural examination: Secondary | ICD-10-CM | POA: Diagnosis not present

## 2023-01-19 DIAGNOSIS — Z7984 Long term (current) use of oral hypoglycemic drugs: Secondary | ICD-10-CM

## 2023-01-19 DIAGNOSIS — Z818 Family history of other mental and behavioral disorders: Secondary | ICD-10-CM

## 2023-01-19 DIAGNOSIS — E785 Hyperlipidemia, unspecified: Secondary | ICD-10-CM | POA: Diagnosis present

## 2023-01-19 DIAGNOSIS — I5041 Acute combined systolic (congestive) and diastolic (congestive) heart failure: Secondary | ICD-10-CM | POA: Diagnosis not present

## 2023-01-19 DIAGNOSIS — Z882 Allergy status to sulfonamides status: Secondary | ICD-10-CM

## 2023-01-19 LAB — URINALYSIS, ROUTINE W REFLEX MICROSCOPIC
Bilirubin Urine: NEGATIVE
Glucose, UA: NEGATIVE mg/dL
Hgb urine dipstick: NEGATIVE
Ketones, ur: NEGATIVE mg/dL
Leukocytes,Ua: NEGATIVE
Nitrite: NEGATIVE
Protein, ur: NEGATIVE mg/dL
Specific Gravity, Urine: 1.003 — ABNORMAL LOW (ref 1.005–1.030)
pH: 7 (ref 5.0–8.0)

## 2023-01-19 LAB — HIV ANTIBODY (ROUTINE TESTING W REFLEX): HIV Screen 4th Generation wRfx: NONREACTIVE

## 2023-01-19 LAB — CBC WITH DIFFERENTIAL/PLATELET
Abs Immature Granulocytes: 0.02 10*3/uL (ref 0.00–0.07)
Basophils Absolute: 0 10*3/uL (ref 0.0–0.1)
Basophils Relative: 0 %
Eosinophils Absolute: 0 10*3/uL (ref 0.0–0.5)
Eosinophils Relative: 1 %
HCT: 38.2 % — ABNORMAL LOW (ref 39.0–52.0)
Hemoglobin: 12.5 g/dL — ABNORMAL LOW (ref 13.0–17.0)
Immature Granulocytes: 0 %
Lymphocytes Relative: 14 %
Lymphs Abs: 1.2 10*3/uL (ref 0.7–4.0)
MCH: 27.7 pg (ref 26.0–34.0)
MCHC: 32.7 g/dL (ref 30.0–36.0)
MCV: 84.7 fL (ref 80.0–100.0)
Monocytes Absolute: 0.4 10*3/uL (ref 0.1–1.0)
Monocytes Relative: 5 %
Neutro Abs: 6.8 10*3/uL (ref 1.7–7.7)
Neutrophils Relative %: 80 %
Platelets: 310 10*3/uL (ref 150–400)
RBC: 4.51 MIL/uL (ref 4.22–5.81)
RDW: 13.1 % (ref 11.5–15.5)
WBC: 8.5 10*3/uL (ref 4.0–10.5)
nRBC: 0 % (ref 0.0–0.2)

## 2023-01-19 LAB — BRAIN NATRIURETIC PEPTIDE: B Natriuretic Peptide: 897.6 pg/mL — ABNORMAL HIGH (ref 0.0–100.0)

## 2023-01-19 LAB — COMPREHENSIVE METABOLIC PANEL
ALT: 20 U/L (ref 0–44)
AST: 34 U/L (ref 15–41)
Albumin: 3.3 g/dL — ABNORMAL LOW (ref 3.5–5.0)
Alkaline Phosphatase: 41 U/L (ref 38–126)
Anion gap: 9 (ref 5–15)
BUN: 8 mg/dL (ref 8–23)
CO2: 24 mmol/L (ref 22–32)
Calcium: 9.1 mg/dL (ref 8.9–10.3)
Chloride: 104 mmol/L (ref 98–111)
Creatinine, Ser: 0.92 mg/dL (ref 0.61–1.24)
GFR, Estimated: >60 mL/min (ref >60)
Glucose, Bld: 144 mg/dL — ABNORMAL HIGH (ref 70–99)
Potassium: 3.4 mmol/L — ABNORMAL LOW (ref 3.5–5.1)
Sodium: 137 mmol/L (ref 135–145)
Total Bilirubin: 1 mg/dL (ref 0.3–1.2)
Total Protein: 6.4 g/dL — ABNORMAL LOW (ref 6.5–8.1)

## 2023-01-19 LAB — TROPONIN I (HIGH SENSITIVITY)
Troponin I (High Sensitivity): 1269 ng/L (ref <18)
Troponin I (High Sensitivity): 1436 ng/L (ref <18)

## 2023-01-19 LAB — HEMOGLOBIN A1C
Hgb A1c MFr Bld: 5.4 % (ref 4.8–5.6)
Mean Plasma Glucose: 108.28 mg/dL

## 2023-01-19 LAB — D-DIMER, QUANTITATIVE: D-Dimer, Quant: 0.37 ug/mL-FEU (ref 0.00–0.50)

## 2023-01-19 LAB — TSH: TSH: 1.447 u[IU]/mL (ref 0.350–4.500)

## 2023-01-19 MED ORDER — ALPRAZOLAM 0.5 MG PO TABS
0.5000 mg | ORAL_TABLET | Freq: Two times a day (BID) | ORAL | Status: DC
  Administered 2023-01-19 – 2023-01-22 (×7): 0.5 mg via ORAL
  Filled 2023-01-19 (×8): qty 1

## 2023-01-19 MED ORDER — LISINOPRIL 5 MG PO TABS
5.0000 mg | ORAL_TABLET | Freq: Every day | ORAL | Status: DC
  Administered 2023-01-20 – 2023-01-22 (×3): 5 mg via ORAL
  Filled 2023-01-19 (×3): qty 1

## 2023-01-19 MED ORDER — ONDANSETRON HCL 4 MG/2ML IJ SOLN
4.0000 mg | Freq: Once | INTRAMUSCULAR | Status: AC
  Administered 2023-01-19: 4 mg via INTRAVENOUS
  Filled 2023-01-19: qty 2

## 2023-01-19 MED ORDER — PANTOPRAZOLE SODIUM 40 MG PO TBEC
40.0000 mg | DELAYED_RELEASE_TABLET | Freq: Every day | ORAL | Status: DC
  Administered 2023-01-20 – 2023-01-22 (×3): 40 mg via ORAL
  Filled 2023-01-19 (×3): qty 1

## 2023-01-19 MED ORDER — NITROGLYCERIN 0.4 MG SL SUBL
0.4000 mg | SUBLINGUAL_TABLET | SUBLINGUAL | Status: DC | PRN
  Administered 2023-01-19 – 2023-01-20 (×3): 0.4 mg via SUBLINGUAL
  Filled 2023-01-19 (×3): qty 1

## 2023-01-19 MED ORDER — ASPIRIN 81 MG PO TBEC
81.0000 mg | DELAYED_RELEASE_TABLET | Freq: Every day | ORAL | Status: DC
  Administered 2023-01-20 – 2023-01-21 (×2): 81 mg via ORAL
  Filled 2023-01-19 (×3): qty 1

## 2023-01-19 MED ORDER — ONDANSETRON HCL 4 MG/2ML IJ SOLN: 4.0000 mg | Freq: Four times a day (QID) | INTRAMUSCULAR | Status: DC | PRN

## 2023-01-19 MED ORDER — MORPHINE SULFATE (PF) 4 MG/ML IV SOLN
4.0000 mg | Freq: Once | INTRAVENOUS | Status: AC
  Administered 2023-01-19: 4 mg via INTRAVENOUS
  Filled 2023-01-19: qty 1

## 2023-01-19 MED ORDER — ONDANSETRON HCL 4 MG PO TABS: 8.0000 mg | ORAL_TABLET | ORAL | Status: DC

## 2023-01-19 MED ORDER — HEPARIN (PORCINE) 25000 UT/250ML-% IV SOLN
1650.0000 [IU]/h | INTRAVENOUS | Status: DC
  Administered 2023-01-19: 1000 [IU]/h via INTRAVENOUS
  Administered 2023-01-20: 1300 [IU]/h via INTRAVENOUS
  Administered 2023-01-21: 1650 [IU]/h via INTRAVENOUS
  Administered 2023-01-21: 1550 [IU]/h via INTRAVENOUS
  Filled 2023-01-19 (×6): qty 250

## 2023-01-19 MED ORDER — HYDROCORTISONE (PERIANAL) 2.5 % EX CREA: 1.0000 | TOPICAL_CREAM | Freq: Every day | CUTANEOUS | Status: DC | PRN

## 2023-01-19 MED ORDER — POTASSIUM CHLORIDE CRYS ER 20 MEQ PO TBCR
40.0000 meq | EXTENDED_RELEASE_TABLET | Freq: Once | ORAL | Status: AC
  Administered 2023-01-19: 40 meq via ORAL
  Filled 2023-01-19: qty 2

## 2023-01-19 MED ORDER — CITALOPRAM HYDROBROMIDE 20 MG PO TABS
20.0000 mg | ORAL_TABLET | Freq: Every day | ORAL | Status: DC
  Administered 2023-01-20 – 2023-01-22 (×3): 20 mg via ORAL
  Filled 2023-01-19: qty 1
  Filled 2023-01-19 (×2): qty 2

## 2023-01-19 MED ORDER — CARVEDILOL 3.125 MG PO TABS
3.1250 mg | ORAL_TABLET | Freq: Two times a day (BID) | ORAL | Status: DC
  Administered 2023-01-19 – 2023-01-22 (×7): 3.125 mg via ORAL
  Filled 2023-01-19 (×7): qty 1

## 2023-01-19 MED ORDER — INSULIN ASPART 100 UNIT/ML IJ SOLN: 0.0000 [IU] | Freq: Three times a day (TID) | INTRAMUSCULAR | Status: DC

## 2023-01-19 MED ORDER — SODIUM CHLORIDE 0.9 % IV SOLN: 250.0000 mL | INTRAVENOUS | Status: DC | PRN

## 2023-01-19 MED ORDER — ALLOPURINOL 300 MG PO TABS
300.0000 mg | ORAL_TABLET | Freq: Every day | ORAL | Status: DC
  Administered 2023-01-20 – 2023-01-21 (×2): 300 mg via ORAL
  Filled 2023-01-19 (×2): qty 3

## 2023-01-19 MED ORDER — SODIUM CHLORIDE 0.9% FLUSH
3.0000 mL | Freq: Two times a day (BID) | INTRAVENOUS | Status: DC
  Administered 2023-01-19 – 2023-01-22 (×5): 3 mL via INTRAVENOUS

## 2023-01-19 MED ORDER — OXYCODONE HCL 5 MG PO TABS
5.0000 mg | ORAL_TABLET | ORAL | Status: AC
  Administered 2023-01-19: 5 mg via ORAL
  Filled 2023-01-19: qty 1

## 2023-01-19 MED ORDER — LORATADINE 10 MG PO TABS
10.0000 mg | ORAL_TABLET | Freq: Every day | ORAL | Status: DC
  Administered 2023-01-20 – 2023-01-22 (×3): 10 mg via ORAL
  Filled 2023-01-19 (×3): qty 1

## 2023-01-19 MED ORDER — CYCLOSPORINE 0.05 % OP EMUL
1.0000 [drp] | Freq: Every day | OPHTHALMIC | Status: DC
  Administered 2023-01-20 – 2023-01-25 (×4): 1 [drp] via OPHTHALMIC
  Filled 2023-01-19 (×8): qty 30

## 2023-01-19 MED ORDER — HYDROCODONE-ACETAMINOPHEN 5-325 MG PO TABS
1.0000 | ORAL_TABLET | Freq: Three times a day (TID) | ORAL | Status: DC
  Administered 2023-01-19: 1 via ORAL
  Filled 2023-01-19: qty 1

## 2023-01-19 MED ORDER — ATORVASTATIN CALCIUM 40 MG PO TABS
40.0000 mg | ORAL_TABLET | Freq: Every day | ORAL | Status: DC
  Administered 2023-01-20 – 2023-01-21 (×2): 40 mg via ORAL
  Filled 2023-01-19 (×2): qty 1

## 2023-01-19 MED ORDER — ACETAMINOPHEN 325 MG PO TABS: 650.0000 mg | ORAL_TABLET | ORAL | Status: DC | PRN

## 2023-01-19 MED ORDER — SODIUM CHLORIDE 0.9% FLUSH: 3.0000 mL | INTRAVENOUS | Status: DC | PRN

## 2023-01-19 MED ORDER — ASPIRIN 81 MG PO CHEW
162.0000 mg | CHEWABLE_TABLET | Freq: Once | ORAL | Status: AC
  Administered 2023-01-19: 162 mg via ORAL
  Filled 2023-01-19: qty 2

## 2023-01-19 MED ORDER — HEPARIN BOLUS VIA INFUSION
4000.0000 [IU] | Freq: Once | INTRAVENOUS | Status: AC
  Administered 2023-01-19: 4000 [IU] via INTRAVENOUS
  Filled 2023-01-19: qty 4000

## 2023-01-19 MED ORDER — FUROSEMIDE 10 MG/ML IJ SOLN
20.0000 mg | Freq: Once | INTRAMUSCULAR | Status: AC
  Administered 2023-01-19: 20 mg via INTRAVENOUS
  Filled 2023-01-19: qty 2

## 2023-01-19 NOTE — ED Provider Triage Note (Cosign Needed Addendum)
Emergency Medicine Provider Triage Evaluation Note  Raymond Roberson , a 67 y.o. male  was evaluated in triage.  Pt complains of chest pain, shortness of breath.  Patient reports symptoms occurring over the past 4 to 5 days.  Was seen in [place] yesterday with diagnosis of NSTEMI with initial troponin of greater than 1200.  Left AGAINST MEDICAL ADVICE due to inability to get bed.  Was told to come to [facility] yesterday but waited till today.  States that chest pain is worse as well as shortness of breath.  Has taken no medication for symptoms today.  Review of Systems  Positive: See above Negative:   Physical Exam  BP (!) 147/74   Pulse (!) 101   Temp 99.3 F (37.4 C) (Oral)   Resp (!) 24   SpO2 97%  Gen:   Awake, no distress   Resp:  Normal effort  MSK:   Moves extremities without difficulty  Other:  Rales auscultated bilateral lower lung fields  Medical Decision Making  Medically screening exam initiated at 3:38 PM.  Appropriate orders placed.  Raymond Roberson was informed that the remainder of the evaluation will be completed by another provider, this initial triage assessment does not replace that evaluation, and the importance of remaining in the ED until their evaluation is complete.     Room requested    Wilnette Kales, [place] 01/19/23 1539

## 2023-01-19 NOTE — ED Provider Notes (Signed)
Raymond Roberson Note   CSN: [id number] Arrival date & time: 01/19/23  1518     History {Add pertinent medical, surgical, social history, OB history to HPI:1} Chief Complaint  Patient presents with   Chest Pain    Raymond Roberson is a 67 y.o. male.  67 year old male with a history of hypertension, diabetes, alcohol abuse, CVA, and chewing tobacco use who presents emergency department with chest pain and shortness of breath.  Reports for the last 3 to 4 days has had shortness of breath as well as a chest discomfort.  Describes chest discomfort is exertional.  Not pleuritic.  Is associated with diaphoresis occasionally.  No vomiting.  Says that his dyspnea on exertion has been progressively worsening so he went to [facility] yesterday and was noted to have a troponin of over thousand.  He was to be admitted to [facility] for heart catheterization but due to prolonged wait times he reports that he decided to leave and come to our facility for further evaluation.  Father had MI at 9.  Says he has a history of alcohol abuse and did drink several beers few days ago.  Denies any tremors or history of alcohol withdrawals recently.  Took 100 mg of Goody's powder today prior to arrival since he is out of his aspirin.  Had a hot dog 2 hours ago.  No history of DVT/PE.  Not on anticoagulation.  No recent surgeries.       Home Medications Prior to Admission medications   Medication Sig Start Date End Date Taking? Authorizing Roberson  allopurinol (ZYLOPRIM) 300 MG tablet Take 300 mg by mouth daily.    Roberson, Historical, MD  ALPRAZolam Duanne Moron) 0.5 MG tablet Take 0.5 mg by mouth 2 (two) times daily.    Roberson, Historical, MD  aspirin 81 MG tablet Take 81 mg by mouth daily.    Roberson, Historical, MD  atorvastatin (LIPITOR) 20 MG tablet Take 20 mg by mouth daily. 04/27/22   Roberson, Historical, MD  citalopram (CELEXA) 40 MG tablet Take 20 mg by  mouth daily.    Roberson, Historical, MD  cycloSPORINE (RESTASIS) 0.05 % ophthalmic emulsion Place 1 drop into both eyes daily.    Roberson, Historical, MD  esomeprazole (NEXIUM) 40 MG capsule Take 40 mg by mouth daily. 04/27/22   Roberson, Historical, MD  HYDROcodone-acetaminophen (NORCO/VICODIN) 5-325 MG tablet Take 1 tablet by mouth 3 (three) times daily.    Roberson, Historical, MD  hydrocortisone (ANUSOL-HC) 2.5 % rectal cream Place 1 application  rectally daily as needed for hemorrhoids or anal itching.    Roberson, Historical, MD  levocetirizine (XYZAL) 5 MG tablet Take 5 mg by mouth daily. 04/27/22   Roberson, Historical, MD  metFORMIN (GLUCOPHAGE-XR) 500 MG 24 hr tablet Take 1,000 mg by mouth daily. 05/03/22   Roberson, Historical, MD  Multiple Vitamins-Minerals (MULTIVITAMIN WITH MINERALS) tablet Take 1 tablet by mouth daily.    Roberson, Historical, MD  ondansetron (ZOFRAN) 8 MG tablet Take 8 mg by mouth once a week. 04/10/22   Roberson, Historical, MD  Polyvinyl Alcohol-Povidone (REFRESH OP) Place 1 drop into both eyes daily as needed (Dry eyes).    Roberson, Historical, MD      Allergies    Naproxen and Sulfa antibiotics    Review of Systems   Review of Systems  Physical Exam Updated Vital Signs BP (!) 147/74   Pulse (!) 101   Temp 99.3 F (37.4 C) (Oral)  Resp (!) 24   SpO2 97%  Physical Exam Vitals and nursing note reviewed.  Constitutional:      General: He is not in acute distress.    Appearance: He is well-developed.  HENT:     Head: Normocephalic and atraumatic.     Right Ear: External ear normal.     Left Ear: External ear normal.     Nose: Nose normal.  Eyes:     Extraocular Movements: Extraocular movements intact.     Conjunctiva/sclera: Conjunctivae normal.     Pupils: Pupils are equal, round, and reactive to light.  Cardiovascular:     Rate and Rhythm: Normal rate and regular rhythm.     Heart sounds: Normal heart sounds.  Pulmonary:     Effort:  Pulmonary effort is normal. No respiratory distress.     Breath sounds: Normal breath sounds.  Musculoskeletal:     Cervical back: Normal range of motion and neck supple.     Right lower leg: No edema.     Left lower leg: No edema.  Skin:    General: Skin is warm and dry.  Neurological:     Mental Status: He is alert. Mental status is at baseline.  Psychiatric:        Mood and Affect: Mood normal.        Behavior: Behavior normal.     ED Results / Procedures / Treatments   Labs (all labs ordered are listed, but only abnormal results are displayed) Labs Reviewed  COMPREHENSIVE METABOLIC PANEL  CBC WITH DIFFERENTIAL/PLATELET  BRAIN NATRIURETIC PEPTIDE  TROPONIN I (HIGH SENSITIVITY)    EKG EKG Interpretation  Date/Time:  Friday January 19 2023 15:40:56 EST Ventricular Rate:  89 PR Interval:  156 QRS Duration: 98 QT Interval:  384 QTC Calculation: 467 R Axis:   -13 Text Interpretation: Normal sinus rhythm Possible Left atrial enlargement Left ventricular hypertrophy ( Sokolow-Lyon , Cornell product ) T wave abnormality, consider lateral ischemia Prolonged QT Abnormal ECG When compared with ECG of 19-Jan-2023 15:31, PREVIOUS ECG IS PRESENT Confirmed by Margaretmary Eddy [phone]) on 01/19/2023 3:53:44 PM  Radiology No results found.  Procedures Procedures  {Document cardiac monitor, telemetry assessment procedure when appropriate:1}  Medications Ordered in ED Medications - No data to display  ED Course/ Medical Decision Making/ A&P Clinical Course as of 01/19/23 1612  Fri Jan 19, 2023  1611 Trish from cardiology called. Feels that pt should be admitted to medicine with cardiology following.  [RP]    Clinical Course User Index [RP] Fransico Meadow, MD   {   Click here for ABCD2, HEART and other calculatorsREFRESH Note before signing :1}                          Medical Decision Making Risk OTC drugs.   ***  {Document critical care time when  appropriate:1} {Document review of labs and clinical decision tools ie heart score, Chads2Vasc2 etc:1}  {Document your independent review of radiology images, and any outside records:1} {Document your discussion with family members, caretakers, and with consultants:1} {Document social determinants of health affecting pt's care:1} {Document your decision making why or why not admission, treatments were needed:1} Final Clinical Impression(s) / ED Diagnoses Final diagnoses:  None    Rx / DC Orders ED Discharge Orders     None

## 2023-01-19 NOTE — ED Notes (Signed)
ED TO INPATIENT HANDOFF REPORT  ED Nurse Name and Phone #: Lawernce Pitts [phone]  S Name/Age/Gender Raymond Roberson 67 y.o. male Room/Bed: 008C/008C  Code Status   Code Status: Full Code  Home/SNF/Other Home Patient oriented to: self Is this baseline? Yes   Triage Complete: Triage complete  Chief Complaint NSTEMI (non-ST elevated myocardial infarction) [facility]) [I21.4]  Triage Note Pt came in via POV d/t CP as NSTEMI in [place] yesterday then left AMA. His Troponins were 1200 (per pt). Pt came to ED today reporting that his CP continues, A/Ox4.   Allergies Allergies  Allergen Reactions   Naproxen Nausea Only   Sulfa Antibiotics Nausea Only    Level of Care/Admitting Diagnosis ED Disposition     ED Disposition  Admit   Condition  --   Comment  Hospital Area: [place] [[id number]]  Level of Care: Telemetry Cardiac [[id number]]  May admit patient to [facility] or [facility] if equivalent level of care is available:: No  Covid Evaluation: Asymptomatic - no recent exposure (last 10 days) testing not required  Diagnosis: NSTEMI (non-ST elevated myocardial infarction) [facility]) [[id number]]  Admitting Physician: Lequita Halt [[id number]]  Attending Physician: Lequita Halt [[id number]]  Certification:: I certify this patient will need inpatient services for at least 2 midnights  Estimated Length of Stay: 3          B Medical/Surgery History Past Medical History:  Diagnosis Date   Acute angina ([place])    Bicuspid aortic valve    Chronic alcohol abuse    Classic migraine 09/02/2013   CVA (cerebral vascular accident) ([place])    Diabetes mellitus without complication ([place])    Dizziness and giddiness 09/02/2013   GERD (gastroesophageal reflux disease)    Gouty arthritis    Hypertension    Major depression    Other and unspecified hyperlipidemia    Peptic ulcer disease    Sleep apnea    Past Surgical History:  Procedure Laterality Date   BIOPSY  07/21/2022    Procedure: BIOPSY;  Surgeon: Harvel Quale, MD;  [facility];  Service: Gastroenterology;;   CIRCUMCISION     COLONOSCOPY WITH PROPOFOL N/A 07/21/2022   Procedure: COLONOSCOPY WITH PROPOFOL;  Surgeon: Harvel Quale, MD;  Location: [facility];  Service: Gastroenterology;  Laterality: N/A;  11:15 ASA 2   CYST REMOVAL TRUNK  11/24/2012   back   INGUINAL HERNIA REPAIR Bilateral    POLYPECTOMY  07/21/2022   Procedure: POLYPECTOMY;  Surgeon: Montez Morita, Quillian Quince, MD;  Location: [facility];  Service: Gastroenterology;;   RHINOPLASTY       A IV Location/Drains/Wounds Patient Lines/Drains/Airways Status     Active Line/Drains/Airways     Name Placement date Placement time Site Days   Peripheral IV 01/19/23 20 G 1" Left Antecubital 01/19/23  1600  Antecubital  less than 1            Intake/Output Last 24 hours No intake or output data in the 24 hours ending 01/19/23 2000  Labs/Imaging Results for orders placed or performed during the hospital encounter of 01/19/23 (from the past 48 hour(s))  Comprehensive metabolic panel     Status: Abnormal   Collection Time: 01/19/23  3:43 PM  Result Value Ref Range   Sodium 137 135 - 145 mmol/L   Potassium 3.4 (L) 3.5 - 5.1 mmol/L   Chloride 104 98 - 111 mmol/L   CO2 24 22 - 32 mmol/L   Glucose,  Bld 144 (H) 70 - 99 mg/dL    Comment: Glucose reference range applies only to samples taken after fasting for at least 8 hours.   BUN 8 8 - 23 mg/dL   Creatinine, Ser 0.92 0.61 - 1.24 mg/dL   Calcium 9.1 8.9 - 10.3 mg/dL   Total Protein 6.4 (L) 6.5 - 8.1 g/dL   Albumin 3.3 (L) 3.5 - 5.0 g/dL   AST 34 15 - 41 U/L   ALT 20 0 - 44 U/L   Alkaline Phosphatase 41 38 - 126 U/L   Total Bilirubin 1.0 0.3 - 1.2 mg/dL   GFR, Estimated >60 >60 mL/min    Comment: (NOTE) Calculated using the CKD-EPI Creatinine Equation (2021)    Anion gap 9 5 - 15    Comment: Performed at [facility], [place] [address],  [place], [place] [address]  CBC with Differential     Status: Abnormal   Collection Time: 01/19/23  3:43 PM  Result Value Ref Range   WBC 8.5 4.0 - 10.5 K/uL   RBC 4.51 4.22 - 5.81 MIL/uL   Hemoglobin 12.5 (L) 13.0 - 17.0 g/dL   HCT 38.2 (L) 39.0 - 52.0 %   MCV 84.7 80.0 - 100.0 fL   MCH 27.7 26.0 - 34.0 pg   MCHC 32.7 30.0 - 36.0 g/dL   RDW 13.1 11.5 - 15.5 %   Platelets 310 150 - 400 K/uL   nRBC 0.0 0.0 - 0.2 %   Neutrophils Relative % 80 %   Neutro Abs 6.8 1.7 - 7.7 K/uL   Lymphocytes Relative 14 %   Lymphs Abs 1.2 0.7 - 4.0 K/uL   Monocytes Relative 5 %   Monocytes Absolute 0.4 0.1 - 1.0 K/uL   Eosinophils Relative 1 %   Eosinophils Absolute 0.0 0.0 - 0.5 K/uL   Basophils Relative 0 %   Basophils Absolute 0.0 0.0 - 0.1 K/uL   Immature Granulocytes 0 %   Abs Immature Granulocytes 0.02 0.00 - 0.07 K/uL    Comment: Performed at [facility] [address] [place], [place] [address]  Troponin I (High Sensitivity)     Status: Abnormal   Collection Time: 01/19/23  3:43 PM  Result Value Ref Range   Troponin I (High Sensitivity) 1,269 (HH) <18 ng/L    Comment: CRITICAL RESULT CALLED TO, READ BACK BY AND VERIFIED WITH B,LUSZCAK RN '@[id number]'$  01/19/23 E,[place] (NOTE) Elevated high sensitivity troponin I (hsTnI) values and significant  changes across serial measurements may suggest ACS but many other  chronic and acute conditions are known to elevate hsTnI results.  Refer to the "Links" section for chest pain algorithms and additional  guidance. Performed at [facility], [place] [address] [place], [place] [address]   Brain natriuretic peptide     Status: Abnormal   Collection Time: 01/19/23  3:43 PM  Result Value Ref Range   B Natriuretic Peptide 897.6 (H) 0.0 - 100.0 pg/mL    Comment: Performed at [facility] [address] [place], [place] [address]  D-dimer, quantitative     Status: None   Collection Time: 01/19/23  5:33 PM  Result Value Ref Range   D-Dimer,  Quant 0.37 0.00 - 0.50 ug/mL-FEU    Comment: (NOTE) At the manufacturer cut-off value of 0.5 g/mL FEU, this assay has a negative predictive value of 95-100%.This assay is intended for use in conjunction with a clinical pretest probability (PTP) assessment model to exclude  pulmonary embolism (PE) and deep venous thrombosis (DVT) in outpatients suspected of PE or DVT. Results should be correlated with clinical presentation. Performed at [facility], [place] [address] [place], [place] [address]   Troponin I (High Sensitivity)     Status: Abnormal   Collection Time: 01/19/23  5:33 PM  Result Value Ref Range   Troponin I (High Sensitivity) 1,436 (HH) <18 ng/L    Comment: CRITICAL VALUE NOTED. VALUE IS CONSISTENT WITH PREVIOUSLY REPORTED/CALLED VALUE (NOTE) Elevated high sensitivity troponin I (hsTnI) values and significant  changes across serial measurements may suggest ACS but many other  chronic and acute conditions are known to elevate hsTnI results.  Refer to the "Links" section for chest pain algorithms and additional  guidance. Performed at [facility], [place] [address] [place], [place] [address]    DG Chest 1 View  Result Date: 01/19/2023 CLINICAL DATA:  Chest pain EXAM: CHEST  1 VIEW COMPARISON:  01/18/2023 FINDINGS: Cardiomegaly. Atelectasis or scarring at the bases. No acute consolidation. Possible tiny left effusion. No visible pneumothorax. IMPRESSION: Cardiomegaly with suspected tiny left effusion. Linear atelectasis or scarring at the bases. Electronically Signed   By: Donavan Foil M.D.   On: 01/19/2023 16:27    Pending Labs Unresulted Labs (From admission, onward)     Start     Ordered   01/21/23 0500  Heparin level (unfractionated)  Daily,   R      01/19/23 1842   01/20/23 0500  CBC  Daily,   R      01/19/23 1842   01/20/23 3329  Basic metabolic panel  Daily,   R     Comments: As Scheduled for 5 days    01/19/23 1915   01/20/23 0100  Heparin level  (unfractionated)  Once-Timed,   URGENT        01/19/23 1842   01/19/23 1934  Urinalysis, Routine w reflex microscopic -Urine, Clean Catch  Once,   R       Question:  Specimen Source  Answer:  Urine, Clean Catch   01/19/23 1933   01/19/23 1934  TSH  Add-on,   AD        01/19/23 1933   01/19/23 1916  Hemoglobin A1c  Once,   R       Comments: To assess prior glycemic control    01/19/23 1915   01/19/23 1914  HIV Antibody (routine testing w rflx)  (HIV Antibody (Routine testing w reflex) panel)  Once,   R        01/19/23 1915            Vitals/Pain Today's Vitals   01/19/23 1700 01/19/23 1725 01/19/23 1800 01/19/23 1830  BP: (!) 140/96  132/87 132/84  Pulse: 87  91 92  Resp: 19  (!) 21 16  Temp:      TempSrc:      SpO2: 91%  91% 93%  Weight:  81.6 kg    Height:  6' (1.829 m)    PainSc:  4       Isolation Precautions No active isolations  Medications Medications  nitroGLYCERIN (NITROSTAT) SL tablet 0.4 mg (0.4 mg Sublingual Given 01/19/23 1719)  heparin ADULT infusion 100 units/mL (25000 units/285m) (1,000 Units/hr Intravenous New Bag/Given 01/19/23 1854)  allopurinol (ZYLOPRIM) tablet 300 mg (has no administration in time range)  aspirin tablet 81 mg (has no administration in time range)  HYDROcodone-acetaminophen (NORCO/VICODIN) 5-325 MG per tablet 1 tablet (has no administration  in time range)  atorvastatin (LIPITOR) tablet 40 mg (has no administration in time range)  ALPRAZolam (XANAX) tablet 0.5 mg (has no administration in time range)  citalopram (CELEXA) tablet 20 mg (has no administration in time range)  pantoprazole (PROTONIX) EC tablet 40 mg (has no administration in time range)  ondansetron (ZOFRAN) tablet 8 mg (has no administration in time range)  levocetirizine (XYZAL) tablet 5 mg (has no administration in time range)  cycloSPORINE (RESTASIS) 0.05 % ophthalmic emulsion 1 drop (has no administration in time range)  hydrocortisone (ANUSOL-HC) 2.5 % rectal  cream 1 Application (has no administration in time range)  furosemide (LASIX) injection 20 mg (has no administration in time range)  potassium chloride SA (KLOR-CON M) CR tablet 40 mEq (has no administration in time range)  sodium chloride flush (NS) 0.9 % injection 3 mL (has no administration in time range)  sodium chloride flush (NS) 0.9 % injection 3 mL (has no administration in time range)  0.9 %  sodium chloride infusion (has no administration in time range)  acetaminophen (TYLENOL) tablet 650 mg (has no administration in time range)  ondansetron (ZOFRAN) injection 4 mg (has no administration in time range)  lisinopril (ZESTRIL) tablet 5 mg (has no administration in time range)  carvedilol (COREG) tablet 3.125 mg (has no administration in time range)  insulin aspart (novoLOG) injection 0-15 Units (has no administration in time range)  aspirin chewable tablet 162 mg (162 mg Oral Given 01/19/23 1628)  morphine (PF) 4 MG/ML injection 4 mg (4 mg Intravenous Given 01/19/23 1628)  ondansetron (ZOFRAN) injection 4 mg (4 mg Intravenous Given 01/19/23 1627)  oxyCODONE (Oxy IR/ROXICODONE) immediate release tablet 5 mg (5 mg Oral Given 01/19/23 1719)  heparin bolus via infusion 4,000 Units (4,000 Units Intravenous Bolus from Bag 01/19/23 1900)    Mobility walks      R Recommendations: See Admitting Provider Note  Report given to:   Additional Notes:      pt is ambulatory, GCS 15, pt can be irritable, pt is using his chewing tobacco and using "spit cup" inside treatment room even though he has been educated about policy.

## 2023-01-19 NOTE — Progress Notes (Signed)
ANTICOAGULATION CONSULT NOTE - Initial Consult  Pharmacy Consult for Heparin infusion  Indication: chest pain/ACS  Allergies  Allergen Reactions   Naproxen Nausea Only   Sulfa Antibiotics Nausea Only    Patient Measurements: Height: 6' (182.9 cm) Weight: 81.6 kg (180 lb) IBW/kg (Calculated) : 77.6 Heparin Dosing Weight: 81.6 kg  Vital Signs: Temp: 99.3 F (37.4 C) (01/26 1526) Temp Source: Oral (01/26 1526) BP: 132/87 (01/26 1800) Pulse Rate: 91 (01/26 1800)  Labs: Recent Labs    01/19/23 1543 01/19/23 1733  HGB 12.5*  --   HCT 38.2*  --   PLT 310  --   CREATININE 0.92  --   TROPONINIHS 1,269* 1,436*    Estimated Creatinine Clearance: 89 mL/min (by C-G formula based on SCr of 0.92 mg/dL).   Medical History: Past Medical History:  Diagnosis Date   Acute angina ([place])    Bicuspid aortic valve    Chronic alcohol abuse    Classic migraine 09/02/2013   CVA (cerebral vascular accident) ([place])    Diabetes mellitus without complication ([place])    Dizziness and giddiness 09/02/2013   GERD (gastroesophageal reflux disease)    Gouty arthritis    Hypertension    Major depression    Other and unspecified hyperlipidemia    Peptic ulcer disease    Sleep apnea     Medications:  (Not in a hospital admission)   Assessment: 67 yo M presenting with chest pain and dyspnea. Plan for [facility] on Monday, 01/22/23. Not taking anticoagulation prior to admission. Pharmacy consulted to dose heparin infusion per ACS protocol.  Hgb 12.5, Plt 310 - no s/sx of bleeding  Goal of Therapy:  Heparin level 0.3-0.7 units/ml Monitor platelets by anticoagulation protocol: Yes   Plan:  Give heparin bolus 4000 units IV x1, followed by Heparin infusion at 1000 units/hr  Check heparin level in 6 hours Monitor daily CBC, heparin level, and for s/sx of bleeding F/u plan for [facility] (anticipated for Monday 01/22/23)  Luisa Hart, PharmD, BCPS Clinical Pharmacist 01/19/2023 6:39 PM   Please refer  to AMION for pharmacy phone number

## 2023-01-19 NOTE — Consult Note (Signed)
Cardiology Consultation   Patient ID: Raymond Roberson MRN: [id number]; DOB: [birth year]  Admit date: 01/19/2023 Date of Consult: 01/19/2023  PCP:  Caryl Bis, MD   [place] Providers Cardiologist:  None   {  History of Present Illness:   Raymond Roberson is a 67 yo male with history of HTN, DM, prior TIA, prior alcohol abuse and tobacco abuse (chewing tobacco) who presented to the [facility] ED today with c/o chest pain and dyspnea. He has had exertional chest pain and dyspnea for for several months. His wife of 54 years died 2 years ago. He has a younger girlfriend now and is having sex 6-8 times per day. He has chest pain while having sex. He went into [facility] yesterday with the same complaints. Troponin reportedly 1200 at that hospital. He was told he would need a cardiac catheterization but he was unhappy with the wait times and left AMA.   He has some chest pain today. This feels like a heaviness. No LE edema.   Past Medical History:  Diagnosis Date   Acute angina ([facility])    Bicuspid aortic valve    Chronic alcohol abuse    Classic migraine 09/02/2013   CVA (cerebral vascular accident) ([place])    Diabetes mellitus without complication ([place])    Dizziness and giddiness 09/02/2013   GERD (gastroesophageal reflux disease)    Gouty arthritis    Hypertension    Major depression    Other and unspecified hyperlipidemia    Peptic ulcer disease    Sleep apnea     Past Surgical History:  Procedure Laterality Date   BIOPSY  07/21/2022   Procedure: BIOPSY;  Surgeon: Harvel Quale, MD;  [facility];  Service: Gastroenterology;;   CIRCUMCISION     COLONOSCOPY WITH PROPOFOL N/A 07/21/2022   Procedure: COLONOSCOPY WITH PROPOFOL;  Surgeon: Harvel Quale, MD;  [facility];  Service: Gastroenterology;  Laterality: N/A;  11:15 ASA 2   CYST REMOVAL TRUNK  11/24/2012   back   INGUINAL HERNIA REPAIR Bilateral    POLYPECTOMY   07/21/2022   Procedure: POLYPECTOMY;  Surgeon: Harvel Quale, MD;  [facility];  Service: Gastroenterology;;   RHINOPLASTY       Home Medications:  Prior to Admission medications   Medication Sig Start Date End Date Taking? Authorizing Provider  allopurinol (ZYLOPRIM) 300 MG tablet Take 300 mg by mouth daily.    [provider]  ALPRAZolam Duanne Moron) 0.5 MG tablet Take 0.5 mg by mouth 2 (two) times daily.    [provider]  aspirin 81 MG tablet Take 81 mg by mouth daily.    [provider]  atorvastatin (LIPITOR) 20 MG tablet Take 20 mg by mouth daily. 04/27/22   [provider]  citalopram (CELEXA) 40 MG tablet Take 20 mg by mouth daily.    [provider]  cycloSPORINE (RESTASIS) 0.05 % ophthalmic emulsion Place 1 drop into both eyes daily.    [provider]  esomeprazole (NEXIUM) 40 MG capsule Take 40 mg by mouth daily. 04/27/22   [provider]  HYDROcodone-acetaminophen (NORCO/VICODIN) 5-325 MG tablet Take 1 tablet by mouth 3 (three) times daily.    [provider]  hydrocortisone (ANUSOL-HC) 2.5 % rectal cream Place 1 application  rectally daily as needed for hemorrhoids or anal itching.    [provider]  levocetirizine (XYZAL) 5 MG tablet Take 5 mg by mouth daily. 04/27/22  [provider]  metFORMIN (GLUCOPHAGE-XR) 500 MG 24 hr tablet Take 1,000 mg by mouth daily. 05/03/22   [provider]  Multiple Vitamins-Minerals (MULTIVITAMIN WITH MINERALS) tablet Take 1 tablet by mouth daily.    [provider]  ondansetron (ZOFRAN) 8 MG tablet Take 8 mg by mouth once a week. 04/10/22   [provider]  Polyvinyl Alcohol-Povidone (REFRESH OP) Place 1 drop into both eyes daily as needed (Dry eyes).    [provider]    Inpatient Medications: Scheduled Meds:  Continuous Infusions:  PRN Meds:   Allergies:    Allergies  Allergen Reactions    Naproxen Nausea Only   Sulfa Antibiotics Nausea Only    Social History:   Social History   Socioeconomic History   Marital status: Widowed    Spouse name: Not on file   Number of children: 2   Years of education: 9th   Highest education level: Not on file  Occupational History   Occupation: Retired Administrator  Tobacco Use   Smoking status: Never   Smokeless tobacco: Current    Types: Snuff  Substance and Sexual Activity   Alcohol use: Not Currently   Drug use: No   Sexual activity: Not on file  Other Topics Concern   Not on file  Social History Narrative   Not on file   Social Determinants of Health   Financial Resource Strain: Not on file  Food Insecurity: Not on file  Transportation Needs: Not on file  Physical Activity: Not on file  Stress: Not on file  Social Connections: Not on file  Intimate Partner Violence: Not on file    Family History:   Family History  Problem Relation Age of Onset   Allergies Mother    Anxiety disorder Mother    CAD Father    Heart failure Father      ROS:  Please see the history of present illness.  All other ROS reviewed and negative.     Physical Exam/Data:   Vitals:   01/19/23 1526 01/19/23 1630 01/19/23 1645 01/19/23 1700  BP: (!) 147/74 (!) 141/91 (!) 137/93 (!) 140/96  Pulse: (!) 101 91 89 87  Resp: (!) 24 (!) '21 19 19  '$ Temp: 99.3 F (37.4 C)     TempSrc: Oral     SpO2: 97% 95% 90% 91%   No intake or output data in the 24 hours ending 01/19/23 1714    07/19/2022   10:21 AM 09/02/2013    2:01 PM 09/01/2013    4:29 PM  Last 3 Weights  Weight (lbs) 220 lb 220 lb 8 oz 219 lb  Weight (kg) 99.791 kg 100.018 kg 99.338 kg     There is no height or weight on file to calculate BMI.  General:  Well nourished, well developed, in no acute distress HEENT: normal Neck: no JVD Vascular: No carotid bruits; Distal pulses 2+ bilaterally Cardiac:  normal S1, S2; RRR; systolic murmur Lungs:  clear to auscultation bilaterally,  no wheezing, rhonchi or rales  Abd: soft, nontender, no hepatomegaly  Ext: no edema Musculoskeletal:  No deformities, BUE and BLE strength normal and equal Skin: warm and dry  Neuro:  CNs 2-12 intact, no focal abnormalities noted Psych:  Normal affect   EKG:  The EKG was personally reviewed and demonstrates: sinus, LVH, Non-specific ST and T wave abnormality Telemetry:  Telemetry was personally reviewed and demonstrates:  sinus  Relevant CV Studies:   Laboratory Data:  High  Sensitivity Troponin:   Recent Labs  Lab 01/19/23 1543  TROPONINIHS 1,269*     Chemistry Recent Labs  Lab 01/19/23 1543  NA 137  K 3.4*  CL 104  CO2 24  GLUCOSE 144*  BUN 8  CREATININE 0.92  CALCIUM 9.1  GFRNONAA >60  ANIONGAP 9    Recent Labs  Lab 01/19/23 1543  PROT 6.4*  ALBUMIN 3.3*  AST 34  ALT 20  ALKPHOS 41  BILITOT 1.0   Lipids No results for input(s): "CHOL", "TRIG", "HDL", "LABVLDL", "LDLCALC", "CHOLHDL" in the last 168 hours.  Hematology Recent Labs  Lab 01/19/23 1543  WBC 8.5  RBC 4.51  HGB 12.5*  HCT 38.2*  MCV 84.7  MCH 27.7  MCHC 32.7  RDW 13.1  PLT 310   Thyroid No results for input(s): "TSH", "FREET4" in the last 168 hours.  BNP Recent Labs  Lab 01/19/23 1543  BNP 897.6*    DDimer No results for input(s): "DDIMER" in the last 168 hours.   Radiology/Studies:  DG Chest 1 View  Result Date: 01/19/2023 CLINICAL DATA:  Chest pain EXAM: CHEST  1 VIEW COMPARISON:  01/18/2023 FINDINGS: Cardiomegaly. Atelectasis or scarring at the bases. No acute consolidation. Possible tiny left effusion. No visible pneumothorax. IMPRESSION: Cardiomegaly with suspected tiny left effusion. Linear atelectasis or scarring at the bases. Electronically Signed   By: Donavan Foil M.D.   On: 01/19/2023 16:27     Assessment and Plan:   Chest pain/NSTEMI: Clinical presentation c/w  unstable angina. Elevated troponin. Cardiac cath is indicated. He has eaten today. Will plan cardiac  cath Monday.  - Continue ASA 81 mg daily, atorvastatin 80 mg po daily.  -Start metoprolol 25 mg po BID -Echo tomorrow.  -IV heparin  -I will add him to the cath schedule for Monday.  I have reviewed the risks, indications, and alternatives to cardiac catheterization, possible angioplasty, and stenting with the patient. Risks include but are not limited to bleeding, infection, vascular injury, stroke, myocardial infection, arrhythmia, kidney injury, radiation-related injury in the case of prolonged fluoroscopy use, emergency cardiac surgery, and death. The patient understands the risks of serious complication is 1-2 in 1478 with diagnostic cardiac cath and 1-2% or less with angioplasty/stenting. -NPO Sunday night  Cardiac murmur: His echo in 2016 suggested a bicuspid aortic valve. He will need an echo this weekend.   Diabetes mellitus: Per primary team  HTN: BP is elevated. Add beta blocker as above.   Pt is being admitted by the hospitalist service. We will follow along this weekend.   For questions or updates, please contact [place] Please consult [email or web address] for contact info under   Signed, Lauree Chandler, MD  01/19/2023 5:14 PM

## 2023-01-19 NOTE — ED Triage Notes (Signed)
Pt came in via POV d/t CP as NSTEMI in [place] yesterday then left AMA. His Troponins were 1200 (per pt). Pt came to ED today reporting that his CP continues, A/Ox4.

## 2023-01-19 NOTE — H&P (Signed)
History and Physical    Raymond Roberson YQM:[id number] DOB: 10-30-1956 DOA: 01/19/2023  PCP: Caryl Bis, MD (Confirm with patient/family/NH records and if not entered, this has to be entered at [facility] point of entry) Patient coming from: Home  I have personally briefly reviewed patient's old medical records in [place]  Chief Complaint: Chest pain, SOB  HPI: Raymond Roberson is a 67 y.o. male with medical history significant of HTN, IIDM, gout, anxiety/depression, presented with worsening of chest pain and shortness of breath.  Patient started to experience exertional dyspnea started about 1+ month ago with occasional pressure-like chest pain.  Last 2 weeks, even minimum activity triggered significant shortness of breath and occasional dry cough.  More often, he also experiences pressure-like chest pain usually lasted less than 2 minutes, triggered by activity and relieved by rest.  He never took any pain medication or nitroglycerin for the chest pain.  Chest pain has been centrally located sometimes radiating to left shoulder, associated with shortness of breath but no nauseous vomiting or palpitations.  Yesterday, patient had a severe episode of chest pain and went to [facility] ED when workup found troponin 1200 and patient was admitted for ischemic workup however patient signed out AMA.  And today, patient again had episode of chest pain and came to [facility] ED.  He denied smoking or drug use, uses alcohol occasionally and last drink was more than 1 week ago. ED Course: Borderline tachycardia heart rate in the 90s, SBP 140s, nonhypoxic.  Chest x-ray showed cardiomegaly and mild pulmonary congestion.  Troponin 1200.  D-dimer 0.37. Cardiology consulted and patient was started on heparin drip  Review of Systems: As per HPI otherwise 14 point review of systems negative.    Past Medical History:  Diagnosis Date   Acute angina (HCC)    Bicuspid aortic valve    Chronic alcohol abuse     Classic migraine 09/02/2013   CVA (cerebral vascular accident) ([place])    Diabetes mellitus without complication ([place])    Dizziness and giddiness 09/02/2013   GERD (gastroesophageal reflux disease)    Gouty arthritis    Hypertension    Major depression    Other and unspecified hyperlipidemia    Peptic ulcer disease    Sleep apnea     Past Surgical History:  Procedure Laterality Date   BIOPSY  07/21/2022   Procedure: BIOPSY;  Surgeon: Harvel Quale, MD;  Location: [facility];  Service: Gastroenterology;;   CIRCUMCISION     COLONOSCOPY WITH PROPOFOL N/A 07/21/2022   Procedure: COLONOSCOPY WITH PROPOFOL;  Surgeon: Harvel Quale, MD;  Location: [facility];  Service: Gastroenterology;  Laterality: N/A;  11:15 ASA 2   CYST REMOVAL TRUNK  11/24/2012   back   INGUINAL HERNIA REPAIR Bilateral    POLYPECTOMY  07/21/2022   Procedure: POLYPECTOMY;  Surgeon: Harvel Quale, MD;  Location: [facility];  Service: Gastroenterology;;   RHINOPLASTY       reports that he has never smoked. His smokeless tobacco use includes snuff. He reports that he does not currently use alcohol. He reports that he does not use drugs.  Allergies  Allergen Reactions   Naproxen Nausea Only   Sulfa Antibiotics Nausea Only    Family History  Problem Relation Age of Onset   Allergies Mother    Anxiety disorder Mother    CAD Father    Heart failure Father     Prior to Admission medications  Medication Sig Start Date End Date Taking? Authorizing Provider  allopurinol (ZYLOPRIM) 300 MG tablet Take 300 mg by mouth daily.    [provider]  ALPRAZolam Duanne Moron) 0.5 MG tablet Take 0.5 mg by mouth 2 (two) times daily.    [provider]  aspirin 81 MG tablet Take 81 mg by mouth daily.    [provider]  atorvastatin (LIPITOR) 20 MG tablet Take 20 mg by mouth daily. 04/27/22   [provider]  citalopram (CELEXA) 40 MG tablet Take 20 mg by  mouth daily.    [provider]  cycloSPORINE (RESTASIS) 0.05 % ophthalmic emulsion Place 1 drop into both eyes daily.    [provider]  esomeprazole (NEXIUM) 40 MG capsule Take 40 mg by mouth daily. 04/27/22   [provider]  HYDROcodone-acetaminophen (NORCO/VICODIN) 5-325 MG tablet Take 1 tablet by mouth 3 (three) times daily.    [provider]  hydrocortisone (ANUSOL-HC) 2.5 % rectal cream Place 1 application  rectally daily as needed for hemorrhoids or anal itching.    [provider]  levocetirizine (XYZAL) 5 MG tablet Take 5 mg by mouth daily. 04/27/22   [provider]  metFORMIN (GLUCOPHAGE-XR) 500 MG 24 hr tablet Take 1,000 mg by mouth daily. 05/03/22   [provider]  Multiple Vitamins-Minerals (MULTIVITAMIN WITH MINERALS) tablet Take 1 tablet by mouth daily.    [provider]  ondansetron (ZOFRAN) 8 MG tablet Take 8 mg by mouth once a week. 04/10/22   [provider]  Polyvinyl Alcohol-Povidone (REFRESH OP) Place 1 drop into both eyes daily as needed (Dry eyes).    [provider]    Physical Exam: Vitals:   01/19/23 1700 01/19/23 1725 01/19/23 1800 01/19/23 1830  BP: (!) 140/96  132/87 132/84  Pulse: 87  91 92  Resp: 19  (!) 21 16  Temp:      TempSrc:      SpO2: 91%  91% 93%  Weight:  81.6 kg    Height:  6' (1.829 m)      Constitutional: NAD, calm, comfortable Vitals:   01/19/23 1700 01/19/23 1725 01/19/23 1800 01/19/23 1830  BP: (!) 140/96  132/87 132/84  Pulse: 87  91 92  Resp: 19  (!) 21 16  Temp:      TempSrc:      SpO2: 91%  91% 93%  Weight:  81.6 kg    Height:  6' (1.829 m)     Eyes: PERRL, lids and conjunctivae normal ENMT: Mucous membranes are moist. Posterior pharynx clear of any exudate or lesions.Normal dentition.  Neck: normal, supple, no masses, no thyromegaly Respiratory: clear to auscultation bilaterally, no wheezing, fine crackles on bilateral bases with  increasing breathing effort. No accessory muscle use.  Cardiovascular: Regular rate and rhythm, no murmurs / rubs / gallops. No extremity edema. 2+ pedal pulses. No carotid bruits.  Abdomen: no tenderness, no masses palpated. No hepatosplenomegaly. Bowel sounds positive.  Musculoskeletal: no clubbing / cyanosis. No joint deformity upper and lower extremities. Good ROM, no contractures. Normal muscle tone.  Skin: no rashes, lesions, ulcers. No induration Neurologic: CN 2-12 grossly intact. Sensation intact, DTR normal. Strength 5/5 in all 4.  Psychiatric: Normal judgment and insight. Alert and oriented x 3. Normal mood.     Labs on Admission: I have personally reviewed following labs and imaging studies  CBC: Recent Labs  Lab 01/19/23 1543  WBC 8.5  NEUTROABS 6.8  HGB 12.5*  HCT  38.2*  MCV 84.7  PLT 008   Basic Metabolic Panel: Recent Labs  Lab 01/19/23 1543  NA 137  K 3.4*  CL 104  CO2 24  GLUCOSE 144*  BUN 8  CREATININE 0.92  CALCIUM 9.1   GFR: Estimated Creatinine Clearance: 89 mL/min (by C-G formula based on SCr of 0.92 mg/dL). Liver Function Tests: Recent Labs  Lab 01/19/23 1543  AST 34  ALT 20  ALKPHOS 41  BILITOT 1.0  PROT 6.4*  ALBUMIN 3.3*   No results for input(s): "LIPASE", "AMYLASE" in the last 168 hours. No results for input(s): "AMMONIA" in the last 168 hours. Coagulation Profile: No results for input(s): "INR", "PROTIME" in the last 168 hours. Cardiac Enzymes: No results for input(s): "CKTOTAL", "CKMB", "CKMBINDEX", "TROPONINI" in the last 168 hours. BNP (last 3 results) No results for input(s): "PROBNP" in the last 8760 hours. HbA1C: No results for input(s): "HGBA1C" in the last 72 hours. CBG: No results for input(s): "GLUCAP" in the last 168 hours. Lipid Profile: No results for input(s): "CHOL", "HDL", "LDLCALC", "TRIG", "CHOLHDL", "LDLDIRECT" in the last 72 hours. Thyroid Function Tests: No results for input(s): "TSH", "T4TOTAL",  "FREET4", "T3FREE", "THYROIDAB" in the last 72 hours. Anemia Panel: No results for input(s): "VITAMINB12", "FOLATE", "FERRITIN", "TIBC", "IRON", "RETICCTPCT" in the last 72 hours. Urine analysis: No results found for: "COLORURINE", "APPEARANCEUR", "LABSPEC", "PHURINE", "GLUCOSEU", "HGBUR", "BILIRUBINUR", "KETONESUR", "PROTEINUR", "UROBILINOGEN", "NITRITE", "LEUKOCYTESUR"  Radiological Exams on Admission: DG Chest 1 View  Result Date: 01/19/2023 CLINICAL DATA:  Chest pain EXAM: CHEST  1 VIEW COMPARISON:  01/18/2023 FINDINGS: Cardiomegaly. Atelectasis or scarring at the bases. No acute consolidation. Possible tiny left effusion. No visible pneumothorax. IMPRESSION: Cardiomegaly with suspected tiny left effusion. Linear atelectasis or scarring at the bases. Electronically Signed   By: Donavan Foil M.D.   On: 01/19/2023 16:27    EKG: Independently reviewed.  Sinus, chronic LVH, appears to have a new ST elevation on V3, computer reported as a nonspecific changes of early repolarization from LVH.  Flattening of ST-T in V5 and V6 appears to be chronic.  Assessment/Plan Active Problems:   NSTEMI (non-ST elevated myocardial infarction) (HCC)   Acute on chronic systolic CHF (congestive heart failure) (HCC)   HTN (hypertension)  (please populate well all problems here in Problem List. (For example, if patient is on BP meds at home and you resume or decide to hold them, it is a problem that needs to be her. Same for CAD, COPD, HLD and so on)   NSTEMI -Continue ACS medications including aspirin, heparin drip -Increase statin to 40 mg daily -Start Coreg 3.125 twice daily and lisinopril 5 mg daily -Cardiology to decide cath schedule.  Will keep patient n.p.o. after midnight -Warned patient regarding risk of leaving hospital AMA can result severe consequences such as cardiac arrhythmia, worsening of heart failure and even possible cardiogenic shock and sudden death.  Patient agreed to stay. -Other Ddx,  D-dimer negative, PE unlikely  Acute, likely on chronic CHF decompensation -Mild fluid overload, 20 mg IV Lasix x 1, reevaluate chest x-ray and volume status tomorrow -Echocardiogram -TSH, UA  Hypokalemia -P.o. replacement  HTN -As above  IIDM -Hold off metformin for incoming cath -Sliding scale -Patient was diagnosed with diabetes about 6 months ago and has been on metformin.  Patient also reported a net weight loss of 60+ pounds over last 6 months, as he has been having symptoms of easy fullness, now he has to change his diet habit to smaller frequent  meals, but symptoms persisted.  Clinically suspect gastroparesis, recommend patient follow-up with GI -Patient also reported frequent feeling of bilateral feet numbness, clinically suspect diabetic neuropathy.  Recommend he follows with neurology for fthat.  Hx of ED -D/W patient regarding safety of using Viagara, and avoid to use when on Nitro. Patient agreed.  Gout, anxiety/depression -Stable, continue allopurinol and SSRI  DVT prophylaxis: Heparin drip Code Status: Full code Family Communication: Significant other at bedside Disposition Plan: Patient is sick with NSTEMI and new onset of CHF, requiring inpatient ischemic/cardiology workup, expect more than 2 midnight hospital stay. Consults called: Cardiology Admission status: Tele admit   Lequita Halt MD [facility] Pager [phone] 01/19/2023, 7:10 PM

## 2023-01-20 ENCOUNTER — Inpatient Hospital Stay (HOSPITAL_COMMUNITY): Payer: Medicare HMO

## 2023-01-20 DIAGNOSIS — I5021 Acute systolic (congestive) heart failure: Secondary | ICD-10-CM | POA: Diagnosis not present

## 2023-01-20 DIAGNOSIS — I214 Non-ST elevation (NSTEMI) myocardial infarction: Secondary | ICD-10-CM | POA: Diagnosis not present

## 2023-01-20 LAB — ECHOCARDIOGRAM COMPLETE
AR max vel: 1.28 cm2
AV Area VTI: 1.43 cm2
AV Area mean vel: 1.21 cm2
AV Mean grad: 33.7 mmHg
AV Peak grad: 52.8 mmHg
Ao pk vel: 3.63 m/s
Area-P 1/2: 6.83 cm2
Calc EF: 36.1 %
Height: 72 in
MV M vel: 4.24 m/s
MV Peak grad: 71.9 mmHg
P 1/2 time: 220 msec
S' Lateral: 5 cm
Single Plane A2C EF: 32.1 %
Single Plane A4C EF: 36.9 %
Weight: 2955.93 oz

## 2023-01-20 LAB — BASIC METABOLIC PANEL
Anion gap: 8 (ref 5–15)
BUN: 9 mg/dL (ref 8–23)
CO2: 27 mmol/L (ref 22–32)
Calcium: 8.9 mg/dL (ref 8.9–10.3)
Chloride: 103 mmol/L (ref 98–111)
Creatinine, Ser: 0.94 mg/dL (ref 0.61–1.24)
GFR, Estimated: >60 mL/min (ref >60)
Glucose, Bld: 101 mg/dL — ABNORMAL HIGH (ref 70–99)
Potassium: 3.6 mmol/L (ref 3.5–5.1)
Sodium: 138 mmol/L (ref 135–145)

## 2023-01-20 LAB — CBC
HCT: 39 % (ref 39.0–52.0)
Hemoglobin: 12.4 g/dL — ABNORMAL LOW (ref 13.0–17.0)
MCH: 27.3 pg (ref 26.0–34.0)
MCHC: 31.8 g/dL (ref 30.0–36.0)
MCV: 85.7 fL (ref 80.0–100.0)
Platelets: 315 10*3/uL (ref 150–400)
RBC: 4.55 MIL/uL (ref 4.22–5.81)
RDW: 13 % (ref 11.5–15.5)
WBC: 8.1 10*3/uL (ref 4.0–10.5)
nRBC: 0 % (ref 0.0–0.2)

## 2023-01-20 LAB — HEPARIN LEVEL (UNFRACTIONATED)
Heparin Unfractionated: <0.1 IU/mL — ABNORMAL LOW (ref 0.30–0.70)
Heparin Unfractionated: 0.16 IU/mL — ABNORMAL LOW (ref 0.30–0.70)
Heparin Unfractionated: 0.41 IU/mL (ref 0.30–0.70)

## 2023-01-20 LAB — GLUCOSE, CAPILLARY
Glucose-Capillary: 104 mg/dL — ABNORMAL HIGH (ref 70–99)
Glucose-Capillary: 104 mg/dL — ABNORMAL HIGH (ref 70–99)
Glucose-Capillary: 95 mg/dL (ref 70–99)
Glucose-Capillary: 108 mg/dL — ABNORMAL HIGH (ref 70–99)

## 2023-01-20 MED ORDER — HEPARIN BOLUS VIA INFUSION
2000.0000 [IU] | Freq: Once | INTRAVENOUS | Status: AC
  Administered 2023-01-20: 2000 [IU] via INTRAVENOUS
  Filled 2023-01-20: qty 2000

## 2023-01-20 MED ORDER — HYDROCODONE-ACETAMINOPHEN 5-325 MG PO TABS
1.0000 | ORAL_TABLET | Freq: Four times a day (QID) | ORAL | Status: DC
  Administered 2023-01-20 – 2023-01-22 (×12): 1 via ORAL
  Filled 2023-01-20 (×12): qty 1

## 2023-01-20 MED ORDER — PERFLUTREN LIPID MICROSPHERE
1.0000 mL | INTRAVENOUS | Status: AC | PRN
  Administered 2023-01-20: 4 mL via INTRAVENOUS

## 2023-01-20 NOTE — Progress Notes (Addendum)
Rounding Note    Patient Name: Raymond Roberson Date of Encounter: 01/20/2023  [facility] Cardiologist: None   Subjective   Had some R upper chest pain and arm pain, but no further "elephant sitting on the chest" on the left he experienced 2 days ago. Has been having some dyspnea on exertion for years. Initially complained of not getting food or scheduled pain med and demanded cath today, when explained that we do not do nonurgent cath on the weekend, he wanted to leave the hospital then come back later for cath. After explaining the function of IV heparin, his elevated troponin and why we wanted to keep him in the hospital, he is apologized and willing to stay.   Inpatient Medications    Scheduled Meds:  allopurinol  300 mg Oral Daily   ALPRAZolam  0.5 mg Oral BID   aspirin EC  81 mg Oral Daily   atorvastatin  40 mg Oral Daily   carvedilol  3.125 mg Oral BID WC   citalopram  20 mg Oral Daily   cycloSPORINE  1 drop Both Eyes Daily   HYDROcodone-acetaminophen  1 tablet Oral TID   insulin aspart  0-15 Units Subcutaneous TID WC   lisinopril  5 mg Oral Daily   loratadine  10 mg Oral Daily   pantoprazole  40 mg Oral Daily   sodium chloride flush  3 mL Intravenous Q12H   Continuous Infusions:  sodium chloride     heparin 1,300 Units/hr (01/20/23 0436)   PRN Meds: sodium chloride, acetaminophen, hydrocortisone, nitroGLYCERIN, ondansetron (ZOFRAN) IV, sodium chloride flush   Vital Signs    Vitals:   01/19/23 2113 01/20/23 0017 01/20/23 0508 01/20/23 0746  BP: (!) 136/90 (!) 106/55 127/78 115/77  Pulse: 91 87 74 71  Resp: '18 18 18 18  '$ Temp: 98.8 F (37.1 C) 99 F (37.2 C) 98 F (36.7 C) 98.4 F (36.9 C)  TempSrc: Oral Oral Oral Oral  SpO2: 97% 92% 95% 98%  Weight: 83.8 kg  83.8 kg   Height: 6' (1.829 m)       Intake/Output Summary (Last 24 hours) at 01/20/2023 0757 Last data filed at 01/20/2023 0700 Gross per 24 hour  Intake 161.47 ml  Output 2100 ml   Net -1938.53 ml      01/20/2023    5:08 AM 01/19/2023    9:13 PM 01/19/2023    5:25 PM  Last 3 Weights  Weight (lbs) 184 lb 11.9 oz 184 lb 11.2 oz 180 lb  Weight (kg) 83.8 kg 83.779 kg 81.647 kg      Telemetry    NSR without significant ventricular ectopy - Personally Reviewed  ECG    NSR without TWI in the lateral leads - Personally Reviewed  Physical Exam   GEN: No acute distress.   Neck: No JVD Cardiac: RRR, no murmurs, rubs, or gallops.  Respiratory: Clear to auscultation bilaterally. GI: Soft, nontender, non-distended  MS: No edema; No deformity. Neuro:  Nonfocal  Psych: Normal affect   Labs    High Sensitivity Troponin:   Recent Labs  Lab 01/19/23 1543 01/19/23 1733  TROPONINIHS 1,269* 1,436*     Chemistry Recent Labs  Lab 01/19/23 1543 01/20/23 0114  NA 137 138  K 3.4* 3.6  CL 104 103  CO2 24 27  GLUCOSE 144* 101*  BUN 8 9  CREATININE 0.92 0.94  CALCIUM 9.1 8.9  PROT 6.4*  --   ALBUMIN 3.3*  --   AST  34  --   ALT 20  --   ALKPHOS 41  --   BILITOT 1.0  --   GFRNONAA >60 >60  ANIONGAP 9 8    Lipids No results for input(s): "CHOL", "TRIG", "HDL", "LABVLDL", "LDLCALC", "CHOLHDL" in the last 168 hours.  Hematology Recent Labs  Lab 01/19/23 1543 01/20/23 0114  WBC 8.5 8.1  RBC 4.51 4.55  HGB 12.5* 12.4*  HCT 38.2* 39.0  MCV 84.7 85.7  MCH 27.7 27.3  MCHC 32.7 31.8  RDW 13.1 13.0  PLT 310 315   Thyroid  Recent Labs  Lab 01/19/23 1733  TSH 1.447    BNP Recent Labs  Lab 01/19/23 1543  BNP 897.6*    DDimer  Recent Labs  Lab 01/19/23 1733  DDIMER 0.37     Radiology    DG Chest 1 View  Result Date: 01/19/2023 CLINICAL DATA:  Chest pain EXAM: CHEST  1 VIEW COMPARISON:  01/18/2023 FINDINGS: Cardiomegaly. Atelectasis or scarring at the bases. No acute consolidation. Possible tiny left effusion. No visible pneumothorax. IMPRESSION: Cardiomegaly with suspected tiny left effusion. Linear atelectasis or scarring at the bases.  Electronically Signed   By: Donavan Foil M.D.   On: 01/19/2023 16:27    Cardiac Studies   N/A  Patient Profile     67 y.o. male with PMH of HTN, DM II, prior TIA, prior EtOH abuse and tobacco abuse who presented to the ED with chest pain and dyspnea.   Assessment & Plan    NSTEMI  - Serial trop 1269 --> 1436, continue IV heparin. ASA and statin  - pending echocardiogram, tentatively planned to proceed with cardiac catheterization on Monday. Risk and benefit explained by Dr. Angelena Form on Friday  Murmur: previous echo in 2016 suggested bicuspid aortic valve, pending repeat echo.   HTN: added lisinopril and coreg  DM II: Hgb A1C 5.4      For questions or updates, please contact [place] Please consult [email or web address] for contact info under        Signed, Almyra Deforest, [place]  01/20/2023, 7:57 AM    I have seen and examined the patient along with Almyra Deforest, PA.  I have reviewed the chart, notes and new data.  I agree with PA's note.  Key new complaints: Currently angina free.  Denies dyspnea. Key examination changes: Early peaking aortic ejection murmur, otherwise normal cardiovascular exam Key new findings / data: Echocardiogram pending.  Outside facility echocardiogram in 2014 showed normal left ventricular systolic function and regional wall motion, mild LVH, no serious valvular abnormalities (suspicion of possible bicuspid valve but no evidence of aortic stenosis or aortic insufficiency at that time).  ECG is mostly consistent with LVH with secondary polarization changes, but with some changes when compared to a tracing from last July.  Biomarkers are unequivocally abnormal.  PLAN: 67 year old gentleman with multiple coronary risk factors presenting with non-STEMI, plan to keep on intravenous heparin, aspirin, statin, beta-blocker until cardiac catheterization on Monday morning. He insists on chewing tobacco.  He does not want a nicotine patch as a substitute.  Despite being  told that this will have adverse effects on his current cardiac problem and that use of any tobacco products is against hospital rules he refuses to relent.  He threatens to leave AMA unless he is allowed to chew tobacco and is actively getting dressed to prove his point.   Although I am not inclined to give into his unreasonable request and I realize that this  is bad for his health, I think that letting him leave [place],  without clarification and treatment of his acute coronary event would be more deleterious than allowing him to continue to chew tobacco.  Sanda Klein, MD, [place] [phone] 01/20/2023, 9:18 AM

## 2023-01-20 NOTE — Progress Notes (Signed)
ANTICOAGULATION CONSULT NOTE - Follow-up Note  Pharmacy Consult for Heparin Indication: chest pain/ACS  Allergies  Allergen Reactions   Naproxen Nausea Only   Sulfa Antibiotics Nausea Only    Patient Measurements: Height: 6' (182.9 cm) Weight: 83.8 kg (184 lb 11.9 oz) IBW/kg (Calculated) : 77.6 Heparin Dosing Weight: 81.6 kg  Vital Signs: Temp: 98.2 F (36.8 C) (01/27 1628) Temp Source: Oral (01/27 1628) BP: 111/75 (01/27 1628) Pulse Rate: 82 (01/27 1628)  Labs: Recent Labs    01/19/23 1543 01/19/23 1733 01/20/23 0114 01/20/23 1019 01/20/23 1813  HGB 12.5*  --  12.4*  --   --   HCT 38.2*  --  39.0  --   --   PLT 310  --  315  --   --   HEPARINUNFRC  --   --  <0.10* 0.16* 0.41  CREATININE 0.92  --  0.94  --   --   TROPONINIHS 1,269* 1,436*  --   --   --     Estimated Creatinine Clearance: 87.1 mL/min (by C-G formula based on SCr of 0.94 mg/dL).   Medical History: Past Medical History:  Diagnosis Date   Acute angina ([place])    Bicuspid aortic valve    Chronic alcohol abuse    Classic migraine 09/02/2013   CVA (cerebral vascular accident) ([place])    Diabetes mellitus without complication ([place])    Dizziness and giddiness 09/02/2013   GERD (gastroesophageal reflux disease)    Gouty arthritis    Hypertension    Major depression    Other and unspecified hyperlipidemia    Peptic ulcer disease    Sleep apnea     Medications:  Medications Prior to Admission  Medication Sig Dispense Refill Last Dose   allopurinol (ZYLOPRIM) 300 MG tablet Take 300 mg by mouth daily.      ALPRAZolam (XANAX) 0.5 MG tablet Take 0.5 mg by mouth 2 (two) times daily.      aspirin 81 MG tablet Take 81 mg by mouth daily.      atorvastatin (LIPITOR) 20 MG tablet Take 20 mg by mouth daily.      citalopram (CELEXA) 40 MG tablet Take 20 mg by mouth daily.      cycloSPORINE (RESTASIS) 0.05 % ophthalmic emulsion Place 1 drop into both eyes daily.      esomeprazole (NEXIUM) 40 MG capsule Take  40 mg by mouth daily.      HYDROcodone-acetaminophen (NORCO/VICODIN) 5-325 MG tablet Take 1 tablet by mouth 3 (three) times daily.      hydrocortisone (ANUSOL-HC) 2.5 % rectal cream Place 1 application  rectally daily as needed for hemorrhoids or anal itching.      levocetirizine (XYZAL) 5 MG tablet Take 5 mg by mouth daily.      metFORMIN (GLUCOPHAGE-XR) 500 MG 24 hr tablet Take 1,000 mg by mouth daily.      Multiple Vitamins-Minerals (MULTIVITAMIN WITH MINERALS) tablet Take 1 tablet by mouth daily.      ondansetron (ZOFRAN) 8 MG tablet Take 8 mg by mouth once a week.      Polyvinyl Alcohol-Povidone (REFRESH OP) Place 1 drop into both eyes daily as needed (Dry eyes).      Scheduled:   allopurinol  300 mg Oral Daily   ALPRAZolam  0.5 mg Oral BID   aspirin EC  81 mg Oral Daily   atorvastatin  40 mg Oral Daily   carvedilol  3.125 mg Oral BID WC   citalopram  20 mg Oral  Daily   cycloSPORINE  1 drop Both Eyes Daily   HYDROcodone-acetaminophen  1 tablet Oral QID   insulin aspart  0-15 Units Subcutaneous TID WC   lisinopril  5 mg Oral Daily   loratadine  10 mg Oral Daily   pantoprazole  40 mg Oral Daily   sodium chloride flush  3 mL Intravenous Q12H   Infusions:   sodium chloride     heparin 1,550 Units/hr (01/20/23 1140)   PRN: sodium chloride, acetaminophen, hydrocortisone, nitroGLYCERIN, ondansetron (ZOFRAN) IV, sodium chloride flush  Assessment: 67 y.o. male with PMH for HTN, T2DM, gout, anxiety/depression, prior TIA presented with chest pain. Pharmacy consulted to dose heparin.   Previously heparin level sub-therapeutic and patient received 2000 unit bolus with an increase to 1550 units/hr. Repeat level therapeutic at 0.41.  No issues with infusion or bleeding per RN.  CBC stable.  Goal of Therapy:  Heparin level 0.3-0.7 units/ml Monitor platelets by anticoagulation protocol: Yes   Plan:  Continue heparin infusion at 1550 units/hr Check anti-Xa level at 0100 and daily  while on heparin Continue to monitor H&H and platelets  Lorelei Pont, PharmD, BCPS 01/20/2023 6:59 PM ED Clinical Pharmacist -  [phone]

## 2023-01-20 NOTE — Progress Notes (Signed)
ANTICOAGULATION CONSULT NOTE  Pharmacy Consult for Heparin Indication: chest pain/ACS Brief A/P: Heparin level subtherapeutic Increase Heparin rate  Allergies  Allergen Reactions   Naproxen Nausea Only   Sulfa Antibiotics Nausea Only    Patient Measurements: Height: 6' (182.9 cm) Weight: 83.8 kg (184 lb 11.2 oz) IBW/kg (Calculated) : 77.6 Heparin Dosing Weight: 81.6 kg  Vital Signs: Temp: 99 F (37.2 C) (01/27 0017) Temp Source: Oral (01/27 0017) BP: 106/55 (01/27 0017) Pulse Rate: 87 (01/27 0017)  Labs: Recent Labs    01/19/23 1543 01/19/23 1733 01/20/23 0114  HGB 12.5*  --  12.4*  HCT 38.2*  --  39.0  PLT 310  --  315  HEPARINUNFRC  --   --  <0.10*  CREATININE 0.92  --   --   TROPONINIHS 1,269* 1,436*  --      Estimated Creatinine Clearance: 89 mL/min (by C-G formula based on SCr of 0.92 mg/dL).  Assessment: 67 y.o. male with chest pain for heparin   Goal of Therapy:  Heparin level 0.3-0.7 units/ml Monitor platelets by anticoagulation protocol: Yes   Plan:  Heparin 2000 units IV bolus, then increase heparin  1300 units/hr Check heparin level in 6 hours.  Phillis Knack, PharmD, BCPS

## 2023-01-20 NOTE — Progress Notes (Signed)
ANTICOAGULATION CONSULT NOTE-Follow Up Pharmacy Consult for Heparin Indication: chest pain/ACS Brief A/P: Heparin level subtherapeutic Increase Heparin rate  Allergies  Allergen Reactions   Naproxen Nausea Only   Sulfa Antibiotics Nausea Only    Patient Measurements: Height: 6' (182.9 cm) Weight: 83.8 kg (184 lb 11.9 oz) IBW/kg (Calculated) : 77.6 Heparin Dosing Weight: 81.6 kg  Vital Signs: Temp: 98 F (36.7 C) (01/27 0853) Temp Source: Oral (01/27 0853) BP: 118/74 (01/27 0853) Pulse Rate: 71 (01/27 0746)  Labs: Recent Labs    01/19/23 1543 01/19/23 1733 01/20/23 0114 01/20/23 1019  HGB 12.5*  --  12.4*  --   HCT 38.2*  --  39.0  --   PLT 310  --  315  --   HEPARINUNFRC  --   --  <0.10* 0.16*  CREATININE 0.92  --  0.94  --   TROPONINIHS 1,269* 1,436*  --   --      Estimated Creatinine Clearance: 87.1 mL/min (by C-G formula based on SCr of 0.94 mg/dL).  Assessment: 67 y.o. male with PMH for HTN, T2DM, gout, anxiety/depression, prior TIA presented with chest pain. Pharmacy consulted to dose heparin. Heparin level at 0.16 with Hgb in the 12s and plts WNL. Per RN no issues with drawing the level (drawn from opposite arm) and no signs/symptoms of bleeding.  Goal of Therapy:  Heparin level 0.3-0.7 units/ml Monitor platelets by anticoagulation protocol: Yes   Plan:  Re-bolus 2000 units  Increase heparin drip to 1550 units/hr (3units/kg*83) Check heparin level in 6 hours. Monitor for signs/symptoms of bleeding  Follow up cath lab on Monday 01/22/2023  Sandford Craze, PharmD. [facility] Acute Care PGY-1  01/20/2023 11:34 AM

## 2023-01-20 NOTE — Progress Notes (Signed)
PROGRESS NOTE    Raymond Roberson  QIO:[id number] DOB: Dec 07, 1956 DOA: 01/19/2023 PCP: Raymond Bis, MD  Chief Complaint  Patient presents with   Chest Pain    Brief Narrative:   Raymond Roberson is Raymond Roberson 67 y.o. male with medical history significant of HTN, IIDM, gout, anxiety/depression, presented with worsening of chest pain and shortness of breath.   Found to have an NSTEMI, systolic HF, aortic stenosis.  Assessment & Plan:   Principal Problem:   NSTEMI (non-ST elevated myocardial infarction) ([facility]) Active Problems:   Acute on chronic systolic CHF (congestive heart failure) (HCC)   HTN (hypertension)  NSTEMI -Continue ACS medications including aspirin, heparin drip -Increase statin to 40 mg daily -Start Coreg 3.125 twice daily and lisinopril 5 mg daily -Cardiology to decide cath schedule.  Will keep patient n.p.o. after midnight -Warned patient regarding risk of leaving hospital AMA can result severe consequences such as cardiac arrhythmia, worsening of heart failure and even possible cardiogenic shock and sudden death.  Patient agreed to stay. -Other Ddx, D-dimer negative, PE unlikely   Acute Combined Systolic and Diastolic Heart failure -echo with EF 30-35%, severe AS, grade III diastolic dysfunction -CXR with trace effusions, pulm venous congestion -s/p lasix 1/26, will continue to diurese as needed based on volume status -appreciate cards recs -strict I/O, daily weights  Severe Aortic Stenosis - per cards   Hypokalemia -P.o. replacement   HTN -As above   IIDM -Hold off metformin for incoming cath -Patient was diagnosed with diabetes about 6 months ago and has been on metformin.  A1c now 5.4.    -Patient also reported frequent feeling of bilateral feet numbness, clinically suspect diabetic neuropathy.     Weight Loss - 60 lbs over past 6 months, needs outpatient workup - ensure he's up to date with routine cancer screening, etc - maybe gastroparesis,  though would expect BG's to be more poorly controlled  Hx of ED -D/W patient regarding safety of using Viagara, and avoid to use when on Nitro. Patient agreed.   Gout, anxiety/depression -Stable, continue allopurinol and SSRI  Tobacco Abuse Encourage cessation     DVT prophylaxis: heparin Code Status: full Family Communication: none Disposition:   Status is: Inpatient Remains inpatient appropriate because: pendingcards procedure   Consultants:  cardiology  Procedures:  Echo IMPRESSIONS     1. Calcified aortic valve with inderminant number of cusps; probable  severe AS with mean gradient as high as 37 mmHg; mild AI.   2. Left ventricular ejection fraction, by estimation, is 30 to 35%. The  left ventricle has moderate to severely decreased function. The left  ventricle demonstrates global hypokinesis. The left ventricular internal  cavity size was moderately dilated.  Left ventricular diastolic parameters are consistent with Grade III  diastolic dysfunction (restrictive).   3. Right ventricular systolic function is mildly reduced. The right  ventricular size is normal.   4. The mitral valve is degenerative. Mild mitral valve regurgitation. No  evidence of mitral stenosis.   5. The aortic valve is calcified. Aortic valve regurgitation is mild.  Severe aortic valve stenosis. Aortic regurgitation PHT measures 220 msec.  Aortic valve area, by VTI measures 1.43 cm. Aortic valve mean gradient  measures 33.7 mmHg. Aortic valve Vmax   measures 3.63 m/s.   6. The inferior vena cava is dilated in size with >50% respiratory  variability, suggesting right atrial pressure of 8 mmHg.   Antimicrobials:  Anti-infectives (From admission, onward)    None  Subjective: C/o CP, improved after nitro  Objective: Vitals:   01/20/23 0746 01/20/23 0853 01/20/23 1252 01/20/23 1628  BP: 115/77 118/74 110/70 111/75  Pulse: 71 80 90 82  Resp: '18 20 18 19  '$ Temp: 98.4 F (36.9  C) 98 F (36.7 C) 98.1 F (36.7 C) 98.2 F (36.8 C)  TempSrc: Oral Oral Oral Oral  SpO2: 98% 97% 97% 97%  Weight:      Height:        Intake/Output Summary (Last 24 hours) at 01/20/2023 1717 Last data filed at 01/20/2023 0700 Gross per 24 hour  Intake 161.47 ml  Output 2100 ml  Net -1938.53 ml   Filed Weights   01/19/23 1725 01/19/23 2113 01/20/23 0508  Weight: 81.6 kg 83.8 kg 83.8 kg    Examination:  General exam: Appears calm and comfortable  Respiratory system: unlabored Cardiovascular system:RRR Gastrointestinal system: Abdomen is nondistended, soft and nontender Central nervous system: Alert and oriented. No focal neurological deficits. Extremities: no LEE    Data Reviewed: I have personally reviewed following labs and imaging studies  CBC: Recent Labs  Lab 01/19/23 1543 01/20/23 0114  WBC 8.5 8.1  NEUTROABS 6.8  --   HGB 12.5* 12.4*  HCT 38.2* 39.0  MCV 84.7 85.7  PLT 310 160    Basic Metabolic Panel: Recent Labs  Lab 01/19/23 1543 01/20/23 0114  NA 137 138  K 3.4* 3.6  CL 104 103  CO2 24 27  GLUCOSE 144* 101*  BUN 8 9  CREATININE 0.92 0.94  CALCIUM 9.1 8.9    GFR: Estimated Creatinine Clearance: 87.1 mL/min (by C-G formula based on SCr of 0.94 mg/dL).  Liver Function Tests: Recent Labs  Lab 01/19/23 1543  AST 34  ALT 20  ALKPHOS 41  BILITOT 1.0  PROT 6.4*  ALBUMIN 3.3*    CBG: Recent Labs  Lab 01/20/23 0847 01/20/23 1140 01/20/23 1633  GLUCAP 104* 104* 95     No results found for this or any previous visit (from the past 240 hour(s)).       Radiology Studies: ECHOCARDIOGRAM COMPLETE  Result Date: 01/20/2023    ECHOCARDIOGRAM REPORT   Patient Name:   Raymond Roberson Date of Exam: 01/20/2023 Medical Rec #:  [id number]        Height:       72.0 in Accession #:    [id number]       Weight:       184.7 lb Date of Birth:  December 30, 1955       BSA:          2.060 m Patient Age:    49 years         BP:           113/63 mmHg  Patient Gender: M                HR:           80 bpm. Exam Location:  Inpatient Procedure: 2D Echo, Cardiac Doppler, Color Doppler and Intracardiac            Opacification Agent Indications:    CHF-Acute Systolic K09.38  History:        Patient has prior history of Echocardiogram examinations, most                 recent 07/09/2013. Stroke; Risk Factors:Hypertension, Diabetes                 and Sleep Apnea. History of  bicuspid aortic valve.  Sonographer:    Darlina Sicilian RDCS Referring Phys: [id number] [place]  1. Calcified aortic valve with inderminant number of cusps; probable severe AS with mean gradient as high as 37 mmHg; mild AI.  2. Left ventricular ejection fraction, by estimation, is 30 to 35%. The left ventricle has moderate to severely decreased function. The left ventricle demonstrates global hypokinesis. The left ventricular internal cavity size was moderately dilated. Left ventricular diastolic parameters are consistent with Grade III diastolic dysfunction (restrictive).  3. Right ventricular systolic function is mildly reduced. The right ventricular size is normal.  4. The mitral valve is degenerative. Mild mitral valve regurgitation. No evidence of mitral stenosis.  5. The aortic valve is calcified. Aortic valve regurgitation is mild. Severe aortic valve stenosis. Aortic regurgitation PHT measures 220 msec. Aortic valve area, by VTI measures 1.43 cm. Aortic valve mean gradient measures 33.7 mmHg. Aortic valve Vmax  measures 3.63 m/s.  6. The inferior vena cava is dilated in size with >50% respiratory variability, suggesting right atrial pressure of 8 mmHg. FINDINGS  Left Ventricle: Left ventricular ejection fraction, by estimation, is 30 to 35%. The left ventricle has moderate to severely decreased function. The left ventricle demonstrates global hypokinesis. Definity contrast agent was given IV to delineate the left ventricular endocardial borders. The left ventricular internal  cavity size was moderately dilated. There is no left ventricular hypertrophy. Left ventricular diastolic parameters are consistent with Grade III diastolic dysfunction (restrictive). Right Ventricle: The right ventricular size is normal. Right ventricular systolic function is mildly reduced. Left Atrium: Left atrial size was normal in size. Right Atrium: Right atrial size was normal in size. Pericardium: There is no evidence of pericardial effusion. Mitral Valve: The mitral valve is degenerative in appearance. Mild mitral annular calcification. Mild mitral valve regurgitation. No evidence of mitral valve stenosis. Tricuspid Valve: The tricuspid valve is grossly normal. Tricuspid valve regurgitation is trivial. No evidence of tricuspid stenosis. Aortic Valve: The aortic valve is calcified. Aortic valve regurgitation is mild. Aortic regurgitation PHT measures 220 msec. Severe aortic stenosis is present. Aortic valve mean gradient measures 33.7 mmHg. Aortic valve peak gradient measures 52.8 mmHg. Aortic valve area, by VTI measures 1.43 cm. Pulmonic Valve: The pulmonic valve was grossly normal. Pulmonic valve regurgitation is trivial. No evidence of pulmonic stenosis. Aorta: The aortic root and ascending aorta are structurally normal, with no evidence of dilitation. Venous: The inferior vena cava is dilated in size with greater than 50% respiratory variability, suggesting right atrial pressure of 8 mmHg. IAS/Shunts: No atrial level shunt detected by color flow Doppler. Additional Comments: Calcified aortic valve with inderminant number of cusps; probable severe AS with mean gradient as high as 37 mmHg; mild AI.  LEFT VENTRICLE PLAX 2D LVIDd:         6.20 cm      Diastology LVIDs:         5.00 cm      LV e' medial:    4.60 cm/s LV PW:         0.90 cm      LV E/e' medial:  28.4 LV IVS:        0.90 cm      LV e' lateral:   5.36 cm/s LVOT diam:     2.50 cm      LV E/e' lateral: 24.3 LV SV:         104 LV SV Index:   51  LVOT Area:  4.91 cm                              3D Volume EF: LV Volumes (MOD)            3D EF:        29 % LV vol d, MOD A2C: 271.0 ml LV EDV:       241 ml LV vol d, MOD A4C: 217.0 ml LV ESV:       172 ml LV vol s, MOD A2C: 184.0 ml LV SV:        69 ml LV vol s, MOD A4C: 137.0 ml LV SV MOD A2C:     87.0 ml LV SV MOD A4C:     217.0 ml LV SV MOD BP:      92.8 ml RIGHT VENTRICLE RV S prime:     12.20 cm/s TAPSE (M-mode): 1.9 cm LEFT ATRIUM             Index        RIGHT ATRIUM           Index LA Vol (A2C):   60.7 ml 29.47 ml/m  RA Area:     18.80 cm LA Vol (A4C):   65.5 ml 31.80 ml/m  RA Volume:   52.50 ml  25.49 ml/m LA Biplane Vol: 63.4 ml 30.78 ml/m  AORTIC VALVE AV Area (Vmax):    1.28 cm AV Area (Vmean):   1.21 cm AV Area (VTI):     1.43 cm AV Vmax:           363.33 cm/s AV Vmean:          275.000 cm/s AV VTI:            0.726 m AV Peak Grad:      52.8 mmHg AV Mean Grad:      33.7 mmHg LVOT Vmax:         94.70 cm/s LVOT Vmean:        68.000 cm/s LVOT VTI:          0.212 m LVOT/AV VTI ratio: 0.29 AI PHT:            220 msec  AORTA Ao Root diam: 3.10 cm Ao Asc diam:  3.40 cm MITRAL VALVE MV Area (PHT): 6.83 cm     SHUNTS MV Decel Time: 111 msec     Systemic VTI:  0.21 m MR Peak grad: 71.9 mmHg     Systemic Diam: 2.50 cm MR Mean grad: 41.0 mmHg MR Vmax:      424.00 cm/s MR Vmean:     289.0 cm/s MV E velocity: 130.50 cm/s MV Lashundra Shiveley velocity: 45.60 cm/s MV E/Chadwin Fury ratio:  2.86 Kirk Ruths MD Electronically signed by Kirk Ruths MD Signature Date/Time: 01/20/2023/3:42:41 PM    Final    DG Chest 1 View  Result Date: 01/20/2023 CLINICAL DATA:  67 year old male with history of congestive heart failure. EXAM: CHEST  1 VIEW COMPARISON:  Chest x-ray 01/19/2023. FINDINGS: Lung volumes are normal. Trace bilateral pleural effusions. No confluent consolidative airspace disease. No pneumothorax. Cephalization of the pulmonary vasculature, without frank pulmonary edema. Heart size is mildly enlarged. Upper  mediastinal contours are within normal limits. Old healed fractures of the posterolateral right seventh and eighth ribs. IMPRESSION: 1. Trace bilateral pleural effusions. 2. Cardiomegaly with pulmonary venous congestion. Electronically Signed   By: Vinnie Langton M.D.   On: 01/20/2023 09:08  DG Chest 1 View  Result Date: 01/19/2023 CLINICAL DATA:  Chest pain EXAM: CHEST  1 VIEW COMPARISON:  01/18/2023 FINDINGS: Cardiomegaly. Atelectasis or scarring at the bases. No acute consolidation. Possible tiny left effusion. No visible pneumothorax. IMPRESSION: Cardiomegaly with suspected tiny left effusion. Linear atelectasis or scarring at the bases. Electronically Signed   By: Donavan Foil M.D.   On: 01/19/2023 16:27        Scheduled Meds:  allopurinol  300 mg Oral Daily   ALPRAZolam  0.5 mg Oral BID   aspirin EC  81 mg Oral Daily   atorvastatin  40 mg Oral Daily   carvedilol  3.125 mg Oral BID WC   citalopram  20 mg Oral Daily   cycloSPORINE  1 drop Both Eyes Daily   HYDROcodone-acetaminophen  1 tablet Oral QID   insulin aspart  0-15 Units Subcutaneous TID WC   lisinopril  5 mg Oral Daily   loratadine  10 mg Oral Daily   pantoprazole  40 mg Oral Daily   sodium chloride flush  3 mL Intravenous Q12H   Continuous Infusions:  sodium chloride     heparin 1,550 Units/hr (01/20/23 1140)     LOS: 1 day    Time spent: over 30 min    Fayrene Helper, MD [facility]   To contact the attending provider between 7A-7P or the covering provider during after hours 7P-7A, please log into the web site [email or web address] and access using universal [place] password for that web site. If you do not have the password, please call the hospital operator.  01/20/2023, 5:17 PM

## 2023-01-21 DIAGNOSIS — I5043 Acute on chronic combined systolic (congestive) and diastolic (congestive) heart failure: Secondary | ICD-10-CM | POA: Diagnosis not present

## 2023-01-21 DIAGNOSIS — I350 Nonrheumatic aortic (valve) stenosis: Secondary | ICD-10-CM | POA: Diagnosis not present

## 2023-01-21 DIAGNOSIS — I214 Non-ST elevation (NSTEMI) myocardial infarction: Secondary | ICD-10-CM | POA: Diagnosis not present

## 2023-01-21 LAB — CBC
HCT: 37.6 % — ABNORMAL LOW (ref 39.0–52.0)
Hemoglobin: 12.5 g/dL — ABNORMAL LOW (ref 13.0–17.0)
MCH: 28.3 pg (ref 26.0–34.0)
MCHC: 33.2 g/dL (ref 30.0–36.0)
MCV: 85.1 fL (ref 80.0–100.0)
Platelets: 308 10*3/uL (ref 150–400)
RBC: 4.42 MIL/uL (ref 4.22–5.81)
RDW: 13 % (ref 11.5–15.5)
WBC: 9.5 10*3/uL (ref 4.0–10.5)
nRBC: 0 % (ref 0.0–0.2)

## 2023-01-21 LAB — HEPARIN LEVEL (UNFRACTIONATED)
Heparin Unfractionated: 0.23 IU/mL — ABNORMAL LOW (ref 0.30–0.70)
Heparin Unfractionated: 0.56 IU/mL (ref 0.30–0.70)
Heparin Unfractionated: 0.48 IU/mL (ref 0.30–0.70)
Heparin Unfractionated: 0.46 IU/mL (ref 0.30–0.70)

## 2023-01-21 LAB — BASIC METABOLIC PANEL
Anion gap: 8 (ref 5–15)
BUN: 10 mg/dL (ref 8–23)
CO2: 27 mmol/L (ref 22–32)
Calcium: 8.9 mg/dL (ref 8.9–10.3)
Chloride: 102 mmol/L (ref 98–111)
Creatinine, Ser: 0.95 mg/dL (ref 0.61–1.24)
GFR, Estimated: >60 mL/min (ref >60)
Glucose, Bld: 122 mg/dL — ABNORMAL HIGH (ref 70–99)
Potassium: 3.6 mmol/L (ref 3.5–5.1)
Sodium: 137 mmol/L (ref 135–145)

## 2023-01-21 LAB — GLUCOSE, CAPILLARY
Glucose-Capillary: 143 mg/dL — ABNORMAL HIGH (ref 70–99)
Glucose-Capillary: 117 mg/dL — ABNORMAL HIGH (ref 70–99)
Glucose-Capillary: 96 mg/dL (ref 70–99)
Glucose-Capillary: 93 mg/dL (ref 70–99)

## 2023-01-21 MED ORDER — SODIUM CHLORIDE 0.9 % IV SOLN: INTRAVENOUS | Status: DC

## 2023-01-21 MED ORDER — ANGIOPLASTY BOOK
Freq: Once | Status: AC
  Filled 2023-01-21: qty 1

## 2023-01-21 MED ORDER — HEPARIN BOLUS VIA INFUSION
2000.0000 [IU] | Freq: Once | INTRAVENOUS | Status: AC
  Administered 2023-01-21: 2000 [IU] via INTRAVENOUS
  Filled 2023-01-21: qty 2000

## 2023-01-21 MED ORDER — ASPIRIN 81 MG PO CHEW: 81.0000 mg | CHEWABLE_TABLET | ORAL | Status: DC

## 2023-01-21 MED ORDER — FUROSEMIDE 10 MG/ML IJ SOLN
40.0000 mg | Freq: Once | INTRAMUSCULAR | Status: AC
  Administered 2023-01-21: 40 mg via INTRAVENOUS
  Filled 2023-01-21: qty 4

## 2023-01-21 MED ORDER — SODIUM CHLORIDE 0.9% FLUSH: 3.0000 mL | INTRAVENOUS | Status: DC | PRN

## 2023-01-21 MED ORDER — SODIUM CHLORIDE 0.9 % IV SOLN: 250.0000 mL | INTRAVENOUS | Status: DC | PRN

## 2023-01-21 MED ORDER — SODIUM CHLORIDE 0.9% FLUSH
3.0000 mL | Freq: Two times a day (BID) | INTRAVENOUS | Status: DC
  Administered 2023-01-22: 3 mL via INTRAVENOUS

## 2023-01-21 MED ORDER — POTASSIUM CHLORIDE CRYS ER 20 MEQ PO TBCR
20.0000 meq | EXTENDED_RELEASE_TABLET | Freq: Once | ORAL | Status: AC
  Administered 2023-01-21: 20 meq via ORAL
  Filled 2023-01-21: qty 1

## 2023-01-21 NOTE — Progress Notes (Signed)
ANTICOAGULATION CONSULT NOTE-Follow Up Pharmacy Consult for Heparin Indication: chest pain/ACS   Allergies  Allergen Reactions   Naproxen Nausea Only   Sulfa Antibiotics Nausea Only    Patient Measurements: Height: 6' (182.9 cm) Weight: 83.8 kg (184 lb 11.9 oz) IBW/kg (Calculated) : 77.6 Heparin Dosing Weight: 81.6 kg  Vital Signs: Temp: 98.5 F (36.9 C) (01/28 0034) Temp Source: Oral (01/28 0034) BP: 117/64 (01/28 0034) Pulse Rate: 76 (01/28 0034)  Labs: Recent Labs    01/19/23 1543 01/19/23 1543 01/19/23 1733 01/20/23 0114 01/20/23 1019 01/20/23 1813 01/21/23 0054  HGB 12.5*  --   --  12.4*  --   --  12.5*  HCT 38.2*  --   --  39.0  --   --  37.6*  PLT 310  --   --  315  --   --  308  HEPARINUNFRC  --    < >  --  <0.10* 0.16* 0.41 0.23*  CREATININE 0.92  --   --  0.94  --   --  0.95  TROPONINIHS 1,269*  --  1,436*  --   --   --   --    < > = values in this interval not displayed.     Estimated Creatinine Clearance: 86.2 mL/min (by C-G formula based on SCr of 0.95 mg/dL).  Assessment: 67 y.o. male with PMH for HTN, T2DM, gout, anxiety/depression, prior TIA presented with chest pain. Pharmacy consulted to dose heparin. Heparin level at 0.16 with Hgb in the 12s and plts WNL. Per RN no issues with drawing the level (drawn from opposite arm) and no signs/symptoms of bleeding.  1/28 AM update:  Heparin level sub-therapeutic   Goal of Therapy:  Heparin level 0.3-0.7 units/ml Monitor platelets by anticoagulation protocol: Yes   Plan:  Heparin 2000 units bolus Inc heparin to 1650 units/hr 1100 heparin level  Narda Bonds, PharmD, BCPS Clinical Pharmacist Phone: [phone]

## 2023-01-21 NOTE — H&P (View-Only) (Signed)
Rounding Note    Patient Name: Raymond Roberson Date of Encounter: 01/21/2023  [facility] Cardiologist: None   Subjective   Slight chest pain last night, did not pay attention if it is left sided chest pressure or the right sided chest pain. Some SOB at rest.   Inpatient Medications    Scheduled Meds:  allopurinol  300 mg Oral Daily   ALPRAZolam  0.5 mg Oral BID   aspirin EC  81 mg Oral Daily   atorvastatin  40 mg Oral Daily   carvedilol  3.125 mg Oral BID WC   citalopram  20 mg Oral Daily   cycloSPORINE  1 drop Both Eyes Daily   HYDROcodone-acetaminophen  1 tablet Oral QID   insulin aspart  0-15 Units Subcutaneous TID WC   lisinopril  5 mg Oral Daily   loratadine  10 mg Oral Daily   pantoprazole  40 mg Oral Daily   sodium chloride flush  3 mL Intravenous Q12H   Continuous Infusions:  sodium chloride     heparin 1,650 Units/hr (01/21/23 0721)   PRN Meds: sodium chloride, acetaminophen, hydrocortisone, nitroGLYCERIN, ondansetron (ZOFRAN) IV, sodium chloride flush   Vital Signs    Vitals:   01/20/23 1628 01/20/23 2128 01/21/23 0034 01/21/23 0651  BP: 111/75 103/61 117/64 (!) 99/55  Pulse: 82 81 76 62  Resp: '19 19 20 18  '$ Temp: 98.2 F (36.8 C) 98.8 F (37.1 C) 98.5 F (36.9 C) 98 F (36.7 C)  TempSrc: Oral Oral Oral Oral  SpO2: 97% 99% 96% 94%  Weight:    81.7 kg  Height:        Intake/Output Summary (Last 24 hours) at 01/21/2023 0749 Last data filed at 01/21/2023 3151 Gross per 24 hour  Intake 440.28 ml  Output --  Net 440.28 ml      01/21/2023    6:51 AM 01/20/2023    5:08 AM 01/19/2023    9:13 PM  Last 3 Weights  Weight (lbs) 180 lb 1.6 oz 184 lb 11.9 oz 184 lb 11.2 oz  Weight (kg) 81.693 kg 83.8 kg 83.779 kg      Telemetry    NSR without significant ventricular ectopy - Personally Reviewed  ECG    NSR with TWI in the lateral leads - Personally Reviewed  Physical Exam   GEN: No acute distress.   Neck: No JVD Cardiac: RRR, no  murmurs, rubs, or gallops.  Respiratory: Clear to auscultation bilaterally. GI: Soft, nontender, non-distended  MS: No edema; No deformity. Neuro:  Nonfocal  Psych: Normal affect   Labs    High Sensitivity Troponin:   Recent Labs  Lab 01/19/23 1543 01/19/23 1733  TROPONINIHS 1,269* 1,436*     Chemistry Recent Labs  Lab 01/19/23 1543 01/20/23 0114 01/21/23 0054  NA 137 138 137  K 3.4* 3.6 3.6  CL 104 103 102  CO2 '24 27 27  '$ GLUCOSE 144* 101* 122*  BUN '8 9 10  '$ CREATININE 0.92 0.94 0.95  CALCIUM 9.1 8.9 8.9  PROT 6.4*  --   --   ALBUMIN 3.3*  --   --   AST 34  --   --   ALT 20  --   --   ALKPHOS 41  --   --   BILITOT 1.0  --   --   GFRNONAA >60 >60 >60  ANIONGAP '9 8 8    '$ Lipids No results for input(s): "CHOL", "TRIG", "HDL", "LABVLDL", "LDLCALC", "CHOLHDL" in the  last 168 hours.  Hematology Recent Labs  Lab 01/19/23 1543 01/20/23 0114 01/21/23 0054  WBC 8.5 8.1 9.5  RBC 4.51 4.55 4.42  HGB 12.5* 12.4* 12.5*  HCT 38.2* 39.0 37.6*  MCV 84.7 85.7 85.1  MCH 27.7 27.3 28.3  MCHC 32.7 31.8 33.2  RDW 13.1 13.0 13.0  PLT 310 315 308   Thyroid  Recent Labs  Lab 01/19/23 1733  TSH 1.447    BNP Recent Labs  Lab 01/19/23 1543  BNP 897.6*    DDimer  Recent Labs  Lab 01/19/23 1733  DDIMER 0.37     Radiology    ECHOCARDIOGRAM COMPLETE  Result Date: 01/20/2023    ECHOCARDIOGRAM REPORT   Patient Name:   LIBORIO SACCENTE Date of Exam: 01/20/2023 Medical Rec #:  [id number]        Height:       72.0 in Accession #:    [id number]       Weight:       184.7 lb Date of Birth:  03-05-1956       BSA:          2.060 m Patient Age:    67 years         BP:           113/63 mmHg Patient Gender: M                HR:           80 bpm. Exam Location:  Inpatient Procedure: 2D Echo, Cardiac Doppler, Color Doppler and Intracardiac            Opacification Agent Indications:    CHF-Acute Systolic S01.09  History:        Patient has prior history of Echocardiogram examinations,  most                 recent 07/09/2013. Stroke; Risk Factors:Hypertension, Diabetes                 and Sleep Apnea. History of bicuspid aortic valve.  Sonographer:    Darlina Sicilian RDCS Referring Phys: [id number] [place]  1. Calcified aortic valve with inderminant number of cusps; probable severe AS with mean gradient as high as 37 mmHg; mild AI.  2. Left ventricular ejection fraction, by estimation, is 30 to 35%. The left ventricle has moderate to severely decreased function. The left ventricle demonstrates global hypokinesis. The left ventricular internal cavity size was moderately dilated. Left ventricular diastolic parameters are consistent with Grade III diastolic dysfunction (restrictive).  3. Right ventricular systolic function is mildly reduced. The right ventricular size is normal.  4. The mitral valve is degenerative. Mild mitral valve regurgitation. No evidence of mitral stenosis.  5. The aortic valve is calcified. Aortic valve regurgitation is mild. Severe aortic valve stenosis. Aortic regurgitation PHT measures 220 msec. Aortic valve area, by VTI measures 1.43 cm. Aortic valve mean gradient measures 33.7 mmHg. Aortic valve Vmax  measures 3.63 m/s.  6. The inferior vena cava is dilated in size with >50% respiratory variability, suggesting right atrial pressure of 8 mmHg. FINDINGS  Left Ventricle: Left ventricular ejection fraction, by estimation, is 30 to 35%. The left ventricle has moderate to severely decreased function. The left ventricle demonstrates global hypokinesis. Definity contrast agent was given IV to delineate the left ventricular endocardial borders. The left ventricular internal cavity size was moderately dilated. There is no left ventricular hypertrophy. Left ventricular diastolic parameters are consistent with Grade  III diastolic dysfunction (restrictive). Right Ventricle: The right ventricular size is normal. Right ventricular systolic function is mildly reduced. Left  Atrium: Left atrial size was normal in size. Right Atrium: Right atrial size was normal in size. Pericardium: There is no evidence of pericardial effusion. Mitral Valve: The mitral valve is degenerative in appearance. Mild mitral annular calcification. Mild mitral valve regurgitation. No evidence of mitral valve stenosis. Tricuspid Valve: The tricuspid valve is grossly normal. Tricuspid valve regurgitation is trivial. No evidence of tricuspid stenosis. Aortic Valve: The aortic valve is calcified. Aortic valve regurgitation is mild. Aortic regurgitation PHT measures 220 msec. Severe aortic stenosis is present. Aortic valve mean gradient measures 33.7 mmHg. Aortic valve peak gradient measures 52.8 mmHg. Aortic valve area, by VTI measures 1.43 cm. Pulmonic Valve: The pulmonic valve was grossly normal. Pulmonic valve regurgitation is trivial. No evidence of pulmonic stenosis. Aorta: The aortic root and ascending aorta are structurally normal, with no evidence of dilitation. Venous: The inferior vena cava is dilated in size with greater than 50% respiratory variability, suggesting right atrial pressure of 8 mmHg. IAS/Shunts: No atrial level shunt detected by color flow Doppler. Additional Comments: Calcified aortic valve with inderminant number of cusps; probable severe AS with mean gradient as high as 37 mmHg; mild AI.  LEFT VENTRICLE PLAX 2D LVIDd:         6.20 cm      Diastology LVIDs:         5.00 cm      LV e' medial:    4.60 cm/s LV PW:         0.90 cm      LV E/e' medial:  28.4 LV IVS:        0.90 cm      LV e' lateral:   5.36 cm/s LVOT diam:     2.50 cm      LV E/e' lateral: 24.3 LV SV:         104 LV SV Index:   51 LVOT Area:     4.91 cm                              3D Volume EF: LV Volumes (MOD)            3D EF:        29 % LV vol d, MOD A2C: 271.0 ml LV EDV:       241 ml LV vol d, MOD A4C: 217.0 ml LV ESV:       172 ml LV vol s, MOD A2C: 184.0 ml LV SV:        69 ml LV vol s, MOD A4C: 137.0 ml LV SV MOD  A2C:     87.0 ml LV SV MOD A4C:     217.0 ml LV SV MOD BP:      92.8 ml RIGHT VENTRICLE RV S prime:     12.20 cm/s TAPSE (M-mode): 1.9 cm LEFT ATRIUM             Index        RIGHT ATRIUM           Index LA Vol (A2C):   60.7 ml 29.47 ml/m  RA Area:     18.80 cm LA Vol (A4C):   65.5 ml 31.80 ml/m  RA Volume:   52.50 ml  25.49 ml/m LA Biplane Vol: 63.4 ml 30.78 ml/m  AORTIC VALVE AV Area (  Vmax):    1.28 cm AV Area (Vmean):   1.21 cm AV Area (VTI):     1.43 cm AV Vmax:           363.33 cm/s AV Vmean:          275.000 cm/s AV VTI:            0.726 m AV Peak Grad:      52.8 mmHg AV Mean Grad:      33.7 mmHg LVOT Vmax:         94.70 cm/s LVOT Vmean:        68.000 cm/s LVOT VTI:          0.212 m LVOT/AV VTI ratio: 0.29 AI PHT:            220 msec  AORTA Ao Root diam: 3.10 cm Ao Asc diam:  3.40 cm MITRAL VALVE MV Area (PHT): 6.83 cm     SHUNTS MV Decel Time: 111 msec     Systemic VTI:  0.21 m MR Peak grad: 71.9 mmHg     Systemic Diam: 2.50 cm MR Mean grad: 41.0 mmHg MR Vmax:      424.00 cm/s MR Vmean:     289.0 cm/s MV E velocity: 130.50 cm/s MV A velocity: 45.60 cm/s MV E/A ratio:  2.86 Kirk Ruths MD Electronically signed by Kirk Ruths MD Signature Date/Time: 01/20/2023/3:42:41 PM    Final    DG Chest 1 View  Result Date: 01/20/2023 CLINICAL DATA:  67 year old male with history of congestive heart failure. EXAM: CHEST  1 VIEW COMPARISON:  Chest x-ray 01/19/2023. FINDINGS: Lung volumes are normal. Trace bilateral pleural effusions. No confluent consolidative airspace disease. No pneumothorax. Cephalization of the pulmonary vasculature, without frank pulmonary edema. Heart size is mildly enlarged. Upper mediastinal contours are within normal limits. Old healed fractures of the posterolateral right seventh and eighth ribs. IMPRESSION: 1. Trace bilateral pleural effusions. 2. Cardiomegaly with pulmonary venous congestion. Electronically Signed   By: Vinnie Langton M.D.   On: 01/20/2023 09:08   DG Chest  1 View  Result Date: 01/19/2023 CLINICAL DATA:  Chest pain EXAM: CHEST  1 VIEW COMPARISON:  01/18/2023 FINDINGS: Cardiomegaly. Atelectasis or scarring at the bases. No acute consolidation. Possible tiny left effusion. No visible pneumothorax. IMPRESSION: Cardiomegaly with suspected tiny left effusion. Linear atelectasis or scarring at the bases. Electronically Signed   By: Donavan Foil M.D.   On: 01/19/2023 16:27    Cardiac Studies   Echo 01/20/2023  1. Calcified aortic valve with inderminant number of cusps; probable  severe AS with mean gradient as high as 37 mmHg; mild AI.   2. Left ventricular ejection fraction, by estimation, is 30 to 35%. The  left ventricle has moderate to severely decreased function. The left  ventricle demonstrates global hypokinesis. The left ventricular internal  cavity size was moderately dilated.  Left ventricular diastolic parameters are consistent with Grade III  diastolic dysfunction (restrictive).   3. Right ventricular systolic function is mildly reduced. The right  ventricular size is normal.   4. The mitral valve is degenerative. Mild mitral valve regurgitation. No  evidence of mitral stenosis.   5. The aortic valve is calcified. Aortic valve regurgitation is mild.  Severe aortic valve stenosis. Aortic regurgitation PHT measures 220 msec.  Aortic valve area, by VTI measures 1.43 cm. Aortic valve mean gradient  measures 33.7 mmHg. Aortic valve Vmax   measures 3.63 m/s.   6. The inferior vena cava is  dilated in size with >50% respiratory  variability, suggesting right atrial pressure of 8 mmHg.   Patient Profile     67 y.o. male with PMH of HTN, DM II, prior TIA, prior EtOH abuse and tobacco abuse who presented to the ED with chest pain and dyspnea.  He was ruled in for NSTEMI.   Assessment & Plan    NSTEMI             - Serial trop 1269 --> 1436, continue IV heparin. ASA and statin             - pending echocardiogram, tentatively planned to  proceed with cardiac catheterization on Monday. Risk and benefit explained by Dr. Angelena Form on Friday and again today including 1:200 chance of major bleeding, 1:500 chance of vascular injury, kidney injury or arrhythmia, 1:1000 chance of MI, stroke, loss of limb or life. Likely need to add RHC on top of Left heart cath for tomorrow.    Severe aortic stenosis: previous echo in 2016 suggested bicuspid aortic valve, pending repeat echo.    - Echo 01/20/2023 showed EF 30-35%, grade 3 DD, mildly reduced RV systolic function, mild MR, severe aortic stenosis with mild AI, mean aortic valve gradient 33.7 mmHg   LV dysfunction: EF 30-35%. Euvolemic on exam.   HTN: added lisinopril and coreg   DM II: Hgb A1C 5.4      For questions or updates, please contact [place] Please consult [email or web address] for contact info under        Signed, Almyra Deforest, [place]  01/21/2023, 7:49 AM    I have seen and examined the patient along with Almyra Deforest, PA.  I have reviewed the chart, notes and new data.  I agree with PA/NP's note.  Key new complaints: Developed shortness of breath and intrascapular discomfort when lying in bed, improved when sitting up. Key examination changes: No rales heard.  Regular rate and rhythm, normal S1, essentially absent S2, mid peaking aortic ejection murmur 2/6, no diastolic murmurs.  No edema or jugular venous distention. Key new findings / data: Echocardiogram shows moderate to severely reduced left ventricular systolic function with EF less than 35% and severe diastolic dysfunction with restrictive filling.  Aortic valve is probably bicuspid with fusion of the left and noncoronary cusps and is very heavily calcified, with restricted leaflet motion.  By color Doppler the aortic insufficiency appears mild, but the pressure half-time is very steep, probably due to high LVEDP.  Aortic valve gradients are consistent with severe aortic stenosis (gradients lower than expected due to poor LV  function).  Aortic valve area calculation is an overestimation due to over measurement of the left ventricular outflow tract diameter.  Although there is a global left ventricular hypokinesis there appears to be disproportionate hypokinesis of the inferior wall and of the left ventricular apex, suggesting multivessel coronary artery disease.  PLAN: Right and left heart catheterization tomorrow.  Suspect that he has multivessel CAD in addition to severe aortic stenosis due to bicuspid aortic valve.  He will probably best served by surgical aortic valve replacement and CABG. Shared Decision Making/Informed Consent The risks [stroke (1 in 1000), death (1 in 1000), kidney failure [usually temporary] (1 in 500), bleeding (1 in 200), allergic reaction [possibly serious] (1 in 200)], benefits (diagnostic support and management of coronary artery disease) and alternatives of a cardiac catheterization were discussed in detail with Mr. Lazaro and he is willing to proceed.   Sanda Klein, MD, [facility] [phone] 01/21/2023, 10:45 AM

## 2023-01-21 NOTE — Progress Notes (Signed)
ANTICOAGULATION CONSULT NOTE-Follow Up Pharmacy Consult for Heparin Indication: chest pain/ACS   Allergies  Allergen Reactions   Naproxen Nausea Only   Sulfa Antibiotics Nausea Only    Patient Measurements: Height: 6' (182.9 cm) Weight: 81.7 kg (180 lb 1.6 oz) IBW/kg (Calculated) : 77.6 Heparin Dosing Weight: 81.6 kg  Vital Signs: Temp: 98.2 F (36.8 C) (01/28 1704) Temp Source: Oral (01/28 1704) BP: 106/75 (01/28 1704) Pulse Rate: 71 (01/28 1704)  Labs: Recent Labs    01/19/23 1543 01/19/23 1733 01/20/23 0114 01/20/23 1019 01/21/23 0054 01/21/23 0606 01/21/23 1040 01/21/23 1656  HGB 12.5*  --  12.4*  --  12.5*  --   --   --   HCT 38.2*  --  39.0  --  37.6*  --   --   --   PLT 310  --  315  --  308  --   --   --   HEPARINUNFRC  --   --  <0.10*   < > 0.23* 0.56 0.48 0.46  CREATININE 0.92  --  0.94  --  0.95  --   --   --   TROPONINIHS 1,269* 1,436*  --   --   --   --   --   --    < > = values in this interval not displayed.     Estimated Creatinine Clearance: 86.2 mL/min (by C-G formula based on SCr of 0.95 mg/dL).  Assessment: 67 y.o. male with PMH for HTN, T2DM, gout, anxiety/depression, prior TIA presented with chest pain. Pharmacy consulted to dose heparin.  -heparin level 0.46 on 1650 units/hr  Goal of Therapy:  Heparin level 0.3-0.7 units/ml Monitor platelets by anticoagulation protocol: Yes   Plan:  -Continue heparin at 1650 units/hr -daily heparin level and CBC  Hildred Laser, PharmD Clinical Pharmacist **Pharmacist phone directory can now be found on [email or web address] ([email or web address]).  Listed under [place].

## 2023-01-21 NOTE — Progress Notes (Signed)
PROGRESS NOTE    Raymond Roberson  UEA:[id number] DOB: [birth year] DOA: 01/19/2023 PCP: Raymond Bis, MD  Chief Complaint  Patient presents with   Chest Pain    Brief Narrative:   Raymond Roberson is Raymond Roberson 67 y.o. male with medical history significant of HTN, IIDM, gout, anxiety/depression, presented with worsening of chest pain and shortness of breath.   Found to have an NSTEMI, systolic HF, aortic stenosis.  Assessment & Plan:   Principal Problem:   NSTEMI (non-ST elevated myocardial infarction) ([place]) Active Problems:   Acute on chronic systolic CHF (congestive heart failure) (HCC)   HTN (hypertension)  NSTEMI -Continue ACS medications including aspirin, heparin drip -Increase statin to 40 mg daily -Start Coreg 3.125 twice daily and lisinopril 5 mg daily -[facility] 1/29 -appreciate cards -Other Ddx, D-dimer negative, PE unlikely   Acute Combined Systolic and Diastolic Heart failure -echo with EF 30-35%, severe AS, grade III diastolic dysfunction -CXR with trace effusions, pulm venous congestion -s/p lasix 1/26, will continue to diurese as needed based on volume status -appreciate cards recs -strict I/O, daily weights  Severe Aortic Stenosis - per cards   Hypokalemia -P.o. replacement   HTN -As above   IIDM -Hold off metformin for incoming cath -Patient was diagnosed with diabetes about 6 months ago and has been on metformin.  A1c now 5.4.    -Patient also reported frequent feeling of bilateral feet numbness, clinically suspect diabetic neuropathy.     Weight Loss - 60 lbs over past 6 months, needs outpatient workup - ensure he's up to date with routine cancer screening, etc - maybe gastroparesis, though would expect BG's to be more poorly controlled  Hx of ED -D/W patient regarding safety of using Viagara, and avoid to use when on Nitro. Patient agreed.   Gout, anxiety/depression -Stable, continue allopurinol and SSRI  Tobacco Abuse Encourage  cessation     DVT prophylaxis: heparin Code Status: full Family Communication: none Disposition:   Status is: Inpatient Remains inpatient appropriate because: pendingcards procedure   Consultants:  cardiology  Procedures:  Echo IMPRESSIONS     1. Calcified aortic valve with inderminant number of cusps; probable  severe AS with mean gradient as high as 37 mmHg; mild AI.   2. Left ventricular ejection fraction, by estimation, is 30 to 35%. The  left ventricle has moderate to severely decreased function. The left  ventricle demonstrates global hypokinesis. The left ventricular internal  cavity size was moderately dilated.  Left ventricular diastolic parameters are consistent with Grade III  diastolic dysfunction (restrictive).   3. Right ventricular systolic function is mildly reduced. The right  ventricular size is normal.   4. The mitral valve is degenerative. Mild mitral valve regurgitation. No  evidence of mitral stenosis.   5. The aortic valve is calcified. Aortic valve regurgitation is mild.  Severe aortic valve stenosis. Aortic regurgitation PHT measures 220 msec.  Aortic valve area, by VTI measures 1.43 cm. Aortic valve mean gradient  measures 33.7 mmHg. Aortic valve Vmax   measures 3.63 m/s.   6. The inferior vena cava is dilated in size with >50% respiratory  variability, suggesting right atrial pressure of 8 mmHg.   Antimicrobials:  Anti-infectives (From admission, onward)    None       Subjective: Notes anxiety regarding pending procedures  Objective: Vitals:   01/21/23 0841 01/21/23 1010 01/21/23 1155 01/21/23 1704  BP: 125/71 (!) 112/96 108/74 106/75  Pulse: 81 88 71 71  Resp:  18  18   Temp: 97.8 F (36.6 C) 98.2 F (36.8 C) 98.6 F (37 C) 98.2 F (36.8 C)  TempSrc: Oral Oral Oral Oral  SpO2: 96% 97% 91%   Weight:      Height:        Intake/Output Summary (Last 24 hours) at 01/21/2023 1907 Last data filed at 01/21/2023 1600 Gross per 24  hour  Intake 581.56 ml  Output 725 ml  Net -143.44 ml   Filed Weights   01/19/23 2113 01/20/23 0508 01/21/23 0651  Weight: 83.8 kg 83.8 kg 81.7 kg    Examination:  General: No acute distress. Cardiovascular: Heart sounds show Raymond Roberson regular rate, and rhythm. Lungs: Clear to auscultation bilaterally Abdomen: Soft, nontender, nondistended Neurological: Alert and oriented 3. Moves all extremities 4 with equal strength. Cranial nerves II through XII grossly intact. Extremities: No clubbing or cyanosis. No edema.   Data Reviewed: I have personally reviewed following labs and imaging studies  CBC: Recent Labs  Lab 01/19/23 1543 01/20/23 0114 01/21/23 0054  WBC 8.5 8.1 9.5  NEUTROABS 6.8  --   --   HGB 12.5* 12.4* 12.5*  HCT 38.2* 39.0 37.6*  MCV 84.7 85.7 85.1  PLT 310 315 767    Basic Metabolic Panel: Recent Labs  Lab 01/19/23 1543 01/20/23 0114 01/21/23 0054  NA 137 138 137  K 3.4* 3.6 3.6  CL 104 103 102  CO2 '24 27 27  '$ GLUCOSE 144* 101* 122*  BUN '8 9 10  '$ CREATININE 0.92 0.94 0.95  CALCIUM 9.1 8.9 8.9    GFR: Estimated Creatinine Clearance: 86.2 mL/min (by C-G formula based on SCr of 0.95 mg/dL).  Liver Function Tests: Recent Labs  Lab 01/19/23 1543  AST 34  ALT 20  ALKPHOS 41  BILITOT 1.0  PROT 6.4*  ALBUMIN 3.3*    CBG: Recent Labs  Lab 01/20/23 1633 01/20/23 2122 01/21/23 0846 01/21/23 1152 01/21/23 1701  GLUCAP 95 108* 143* 117* 96     No results found for this or any previous visit (from the past 240 hour(s)).       Radiology Studies: ECHOCARDIOGRAM COMPLETE  Result Date: 01/20/2023    ECHOCARDIOGRAM REPORT   Patient Name:   Raymond Roberson Date of Exam: 01/20/2023 Medical Rec #:  [id number]        Height:       72.0 in Accession #:    [id number]       Weight:       184.7 lb Date of Birth:  1956/03/20       BSA:          2.060 m Patient Age:    27 years         BP:           113/63 mmHg Patient Gender: M                HR:            80 bpm. Exam Location:  Inpatient Procedure: 2D Echo, Cardiac Doppler, Color Doppler and Intracardiac            Opacification Agent Indications:    CHF-Acute Systolic M42.68  History:        Patient has prior history of Echocardiogram examinations, most                 recent 07/09/2013. Stroke; Risk Factors:Hypertension, Diabetes  and Sleep Apnea. History of bicuspid aortic valve.  Sonographer:    Darlina Sicilian RDCS Referring Phys: [id number] [place]  1. Calcified aortic valve with inderminant number of cusps; probable severe AS with mean gradient as high as 37 mmHg; mild AI.  2. Left ventricular ejection fraction, by estimation, is 30 to 35%. The left ventricle has moderate to severely decreased function. The left ventricle demonstrates global hypokinesis. The left ventricular internal cavity size was moderately dilated. Left ventricular diastolic parameters are consistent with Grade III diastolic dysfunction (restrictive).  3. Right ventricular systolic function is mildly reduced. The right ventricular size is normal.  4. The mitral valve is degenerative. Mild mitral valve regurgitation. No evidence of mitral stenosis.  5. The aortic valve is calcified. Aortic valve regurgitation is mild. Severe aortic valve stenosis. Aortic regurgitation PHT measures 220 msec. Aortic valve area, by VTI measures 1.43 cm. Aortic valve mean gradient measures 33.7 mmHg. Aortic valve Vmax  measures 3.63 m/s.  6. The inferior vena cava is dilated in size with >50% respiratory variability, suggesting right atrial pressure of 8 mmHg. FINDINGS  Left Ventricle: Left ventricular ejection fraction, by estimation, is 30 to 35%. The left ventricle has moderate to severely decreased function. The left ventricle demonstrates global hypokinesis. Definity contrast agent was given IV to delineate the left ventricular endocardial borders. The left ventricular internal cavity size was moderately dilated. There is no  left ventricular hypertrophy. Left ventricular diastolic parameters are consistent with Grade III diastolic dysfunction (restrictive). Right Ventricle: The right ventricular size is normal. Right ventricular systolic function is mildly reduced. Left Atrium: Left atrial size was normal in size. Right Atrium: Right atrial size was normal in size. Pericardium: There is no evidence of pericardial effusion. Mitral Valve: The mitral valve is degenerative in appearance. Mild mitral annular calcification. Mild mitral valve regurgitation. No evidence of mitral valve stenosis. Tricuspid Valve: The tricuspid valve is grossly normal. Tricuspid valve regurgitation is trivial. No evidence of tricuspid stenosis. Aortic Valve: The aortic valve is calcified. Aortic valve regurgitation is mild. Aortic regurgitation PHT measures 220 msec. Severe aortic stenosis is present. Aortic valve mean gradient measures 33.7 mmHg. Aortic valve peak gradient measures 52.8 mmHg. Aortic valve area, by VTI measures 1.43 cm. Pulmonic Valve: The pulmonic valve was grossly normal. Pulmonic valve regurgitation is trivial. No evidence of pulmonic stenosis. Aorta: The aortic root and ascending aorta are structurally normal, with no evidence of dilitation. Venous: The inferior vena cava is dilated in size with greater than 50% respiratory variability, suggesting right atrial pressure of 8 mmHg. IAS/Shunts: No atrial level shunt detected by color flow Doppler. Additional Comments: Calcified aortic valve with inderminant number of cusps; probable severe AS with mean gradient as high as 37 mmHg; mild AI.  LEFT VENTRICLE PLAX 2D LVIDd:         6.20 cm      Diastology LVIDs:         5.00 cm      LV e' medial:    4.60 cm/s LV PW:         0.90 cm      LV E/e' medial:  28.4 LV IVS:        0.90 cm      LV e' lateral:   5.36 cm/s LVOT diam:     2.50 cm      LV E/e' lateral: 24.3 LV SV:         104 LV SV Index:   51  LVOT Area:     4.91 cm                               3D Volume EF: LV Volumes (MOD)            3D EF:        29 % LV vol d, MOD A2C: 271.0 ml LV EDV:       241 ml LV vol d, MOD A4C: 217.0 ml LV ESV:       172 ml LV vol s, MOD A2C: 184.0 ml LV SV:        69 ml LV vol s, MOD A4C: 137.0 ml LV SV MOD A2C:     87.0 ml LV SV MOD A4C:     217.0 ml LV SV MOD BP:      92.8 ml RIGHT VENTRICLE RV S prime:     12.20 cm/s TAPSE (M-mode): 1.9 cm LEFT ATRIUM             Index        RIGHT ATRIUM           Index LA Vol (A2C):   60.7 ml 29.47 ml/m  RA Area:     18.80 cm LA Vol (A4C):   65.5 ml 31.80 ml/m  RA Volume:   52.50 ml  25.49 ml/m LA Biplane Vol: 63.4 ml 30.78 ml/m  AORTIC VALVE AV Area (Vmax):    1.28 cm AV Area (Vmean):   1.21 cm AV Area (VTI):     1.43 cm AV Vmax:           363.33 cm/s AV Vmean:          275.000 cm/s AV VTI:            0.726 m AV Peak Grad:      52.8 mmHg AV Mean Grad:      33.7 mmHg LVOT Vmax:         94.70 cm/s LVOT Vmean:        68.000 cm/s LVOT VTI:          0.212 m LVOT/AV VTI ratio: 0.29 AI PHT:            220 msec  AORTA Ao Root diam: 3.10 cm Ao Asc diam:  3.40 cm MITRAL VALVE MV Area (PHT): 6.83 cm     SHUNTS MV Decel Time: 111 msec     Systemic VTI:  0.21 m MR Peak grad: 71.9 mmHg     Systemic Diam: 2.50 cm MR Mean grad: 41.0 mmHg MR Vmax:      424.00 cm/s MR Vmean:     289.0 cm/s MV E velocity: 130.50 cm/s MV Korban Shearer velocity: 45.60 cm/s MV E/Iwalani Templeton ratio:  2.86 Kirk Ruths MD Electronically signed by Kirk Ruths MD Signature Date/Time: 01/20/2023/3:42:41 PM    Final    DG Chest 1 View  Result Date: 01/20/2023 CLINICAL DATA:  67 year old male with history of congestive heart failure. EXAM: CHEST  1 VIEW COMPARISON:  Chest x-ray 01/19/2023. FINDINGS: Lung volumes are normal. Trace bilateral pleural effusions. No confluent consolidative airspace disease. No pneumothorax. Cephalization of the pulmonary vasculature, without frank pulmonary edema. Heart size is mildly enlarged. Upper mediastinal contours are within normal limits. Old healed  fractures of the posterolateral right seventh and eighth ribs. IMPRESSION: 1. Trace bilateral pleural effusions. 2. Cardiomegaly with pulmonary venous congestion. Electronically Signed   By: Quillian Quince  Entrikin M.D.   On: 01/20/2023 09:08        Scheduled Meds:  allopurinol  300 mg Oral Daily   ALPRAZolam  0.5 mg Oral BID   angioplasty book   Does not apply Once   [START ON 01/22/2023] aspirin  81 mg Oral Pre-Cath   aspirin EC  81 mg Oral Daily   atorvastatin  40 mg Oral Daily   carvedilol  3.125 mg Oral BID WC   citalopram  20 mg Oral Daily   cycloSPORINE  1 drop Both Eyes Daily   HYDROcodone-acetaminophen  1 tablet Oral QID   insulin aspart  0-15 Units Subcutaneous TID WC   lisinopril  5 mg Oral Daily   loratadine  10 mg Oral Daily   pantoprazole  40 mg Oral Daily   sodium chloride flush  3 mL Intravenous Q12H   sodium chloride flush  3 mL Intravenous Q12H   Continuous Infusions:  sodium chloride     sodium chloride     [START ON 01/22/2023] sodium chloride     heparin 1,650 Units/hr (01/21/23 1800)     LOS: 2 days    Time spent: over 30 min    Fayrene Helper, MD [facility]   To contact the attending provider between 7A-7P or the covering provider during after hours 7P-7A, please log into the web site [email or web address] and access using universal [place] password for that web site. If you do not have the password, please call the hospital operator.  01/21/2023, 7:07 PM

## 2023-01-21 NOTE — Progress Notes (Addendum)
Rounding Note    Patient Name: Raymond Roberson Date of Encounter: 01/21/2023  [facility] Cardiologist: None   Subjective   Slight chest pain last night, did not pay attention if it is left sided chest pressure or the right sided chest pain. Some SOB at rest.   Inpatient Medications    Scheduled Meds:  allopurinol  300 mg Oral Daily   ALPRAZolam  0.5 mg Oral BID   aspirin EC  81 mg Oral Daily   atorvastatin  40 mg Oral Daily   carvedilol  3.125 mg Oral BID WC   citalopram  20 mg Oral Daily   cycloSPORINE  1 drop Both Eyes Daily   HYDROcodone-acetaminophen  1 tablet Oral QID   insulin aspart  0-15 Units Subcutaneous TID WC   lisinopril  5 mg Oral Daily   loratadine  10 mg Oral Daily   pantoprazole  40 mg Oral Daily   sodium chloride flush  3 mL Intravenous Q12H   Continuous Infusions:  sodium chloride     heparin 1,650 Units/hr (01/21/23 0721)   PRN Meds: sodium chloride, acetaminophen, hydrocortisone, nitroGLYCERIN, ondansetron (ZOFRAN) IV, sodium chloride flush   Vital Signs    Vitals:   01/20/23 1628 01/20/23 2128 01/21/23 0034 01/21/23 0651  BP: 111/75 103/61 117/64 (!) 99/55  Pulse: 82 81 76 62  Resp: '19 19 20 18  '$ Temp: 98.2 F (36.8 C) 98.8 F (37.1 C) 98.5 F (36.9 C) 98 F (36.7 C)  TempSrc: Oral Oral Oral Oral  SpO2: 97% 99% 96% 94%  Weight:    81.7 kg  Height:        Intake/Output Summary (Last 24 hours) at 01/21/2023 0749 Last data filed at 01/21/2023 0962 Gross per 24 hour  Intake 440.28 ml  Output --  Net 440.28 ml      01/21/2023    6:51 AM 01/20/2023    5:08 AM 01/19/2023    9:13 PM  Last 3 Weights  Weight (lbs) 180 lb 1.6 oz 184 lb 11.9 oz 184 lb 11.2 oz  Weight (kg) 81.693 kg 83.8 kg 83.779 kg      Telemetry    NSR without significant ventricular ectopy - Personally Reviewed  ECG    NSR with TWI in the lateral leads - Personally Reviewed  Physical Exam   GEN: No acute distress.   Neck: No JVD Cardiac: RRR, no  murmurs, rubs, or gallops.  Respiratory: Clear to auscultation bilaterally. GI: Soft, nontender, non-distended  MS: No edema; No deformity. Neuro:  Nonfocal  Psych: Normal affect   Labs    High Sensitivity Troponin:   Recent Labs  Lab 01/19/23 1543 01/19/23 1733  TROPONINIHS 1,269* 1,436*     Chemistry Recent Labs  Lab 01/19/23 1543 01/20/23 0114 01/21/23 0054  NA 137 138 137  K 3.4* 3.6 3.6  CL 104 103 102  CO2 '24 27 27  '$ GLUCOSE 144* 101* 122*  BUN '8 9 10  '$ CREATININE 0.92 0.94 0.95  CALCIUM 9.1 8.9 8.9  PROT 6.4*  --   --   ALBUMIN 3.3*  --   --   AST 34  --   --   ALT 20  --   --   ALKPHOS 41  --   --   BILITOT 1.0  --   --   GFRNONAA >60 >60 >60  ANIONGAP '9 8 8    '$ Lipids No results for input(s): "CHOL", "TRIG", "HDL", "LABVLDL", "LDLCALC", "CHOLHDL" in the  last 168 hours.  Hematology Recent Labs  Lab 01/19/23 1543 01/20/23 0114 01/21/23 0054  WBC 8.5 8.1 9.5  RBC 4.51 4.55 4.42  HGB 12.5* 12.4* 12.5*  HCT 38.2* 39.0 37.6*  MCV 84.7 85.7 85.1  MCH 27.7 27.3 28.3  MCHC 32.7 31.8 33.2  RDW 13.1 13.0 13.0  PLT 310 315 308   Thyroid  Recent Labs  Lab 01/19/23 1733  TSH 1.447    BNP Recent Labs  Lab 01/19/23 1543  BNP 897.6*    DDimer  Recent Labs  Lab 01/19/23 1733  DDIMER 0.37     Radiology    ECHOCARDIOGRAM COMPLETE  Result Date: 01/20/2023    ECHOCARDIOGRAM REPORT   Patient Name:   Raymond Roberson Date of Exam: 01/20/2023 Medical Rec #:  [id number]        Height:       72.0 in Accession #:    [id number]       Weight:       184.7 lb Date of Birth:  Oct 18, 1956       BSA:          2.060 m Patient Age:    67 years         BP:           113/63 mmHg Patient Gender: M                HR:           80 bpm. Exam Location:  Inpatient Procedure: 2D Echo, Cardiac Doppler, Color Doppler and Intracardiac            Opacification Agent Indications:    CHF-Acute Systolic Y17.49  History:        Patient has prior history of Echocardiogram examinations,  most                 recent 07/09/2013. Stroke; Risk Factors:Hypertension, Diabetes                 and Sleep Apnea. History of bicuspid aortic valve.  Sonographer:    Darlina Sicilian RDCS Referring Phys: [id number] [place]  1. Calcified aortic valve with inderminant number of cusps; probable severe AS with mean gradient as high as 37 mmHg; mild AI.  2. Left ventricular ejection fraction, by estimation, is 30 to 35%. The left ventricle has moderate to severely decreased function. The left ventricle demonstrates global hypokinesis. The left ventricular internal cavity size was moderately dilated. Left ventricular diastolic parameters are consistent with Grade III diastolic dysfunction (restrictive).  3. Right ventricular systolic function is mildly reduced. The right ventricular size is normal.  4. The mitral valve is degenerative. Mild mitral valve regurgitation. No evidence of mitral stenosis.  5. The aortic valve is calcified. Aortic valve regurgitation is mild. Severe aortic valve stenosis. Aortic regurgitation PHT measures 220 msec. Aortic valve area, by VTI measures 1.43 cm. Aortic valve mean gradient measures 33.7 mmHg. Aortic valve Vmax  measures 3.63 m/s.  6. The inferior vena cava is dilated in size with >50% respiratory variability, suggesting right atrial pressure of 8 mmHg. FINDINGS  Left Ventricle: Left ventricular ejection fraction, by estimation, is 30 to 35%. The left ventricle has moderate to severely decreased function. The left ventricle demonstrates global hypokinesis. Definity contrast agent was given IV to delineate the left ventricular endocardial borders. The left ventricular internal cavity size was moderately dilated. There is no left ventricular hypertrophy. Left ventricular diastolic parameters are consistent with Grade  III diastolic dysfunction (restrictive). Right Ventricle: The right ventricular size is normal. Right ventricular systolic function is mildly reduced. Left  Atrium: Left atrial size was normal in size. Right Atrium: Right atrial size was normal in size. Pericardium: There is no evidence of pericardial effusion. Mitral Valve: The mitral valve is degenerative in appearance. Mild mitral annular calcification. Mild mitral valve regurgitation. No evidence of mitral valve stenosis. Tricuspid Valve: The tricuspid valve is grossly normal. Tricuspid valve regurgitation is trivial. No evidence of tricuspid stenosis. Aortic Valve: The aortic valve is calcified. Aortic valve regurgitation is mild. Aortic regurgitation PHT measures 220 msec. Severe aortic stenosis is present. Aortic valve mean gradient measures 33.7 mmHg. Aortic valve peak gradient measures 52.8 mmHg. Aortic valve area, by VTI measures 1.43 cm. Pulmonic Valve: The pulmonic valve was grossly normal. Pulmonic valve regurgitation is trivial. No evidence of pulmonic stenosis. Aorta: The aortic root and ascending aorta are structurally normal, with no evidence of dilitation. Venous: The inferior vena cava is dilated in size with greater than 50% respiratory variability, suggesting right atrial pressure of 8 mmHg. IAS/Shunts: No atrial level shunt detected by color flow Doppler. Additional Comments: Calcified aortic valve with inderminant number of cusps; probable severe AS with mean gradient as high as 37 mmHg; mild AI.  LEFT VENTRICLE PLAX 2D LVIDd:         6.20 cm      Diastology LVIDs:         5.00 cm      LV e' medial:    4.60 cm/s LV PW:         0.90 cm      LV E/e' medial:  28.4 LV IVS:        0.90 cm      LV e' lateral:   5.36 cm/s LVOT diam:     2.50 cm      LV E/e' lateral: 24.3 LV SV:         104 LV SV Index:   51 LVOT Area:     4.91 cm                              3D Volume EF: LV Volumes (MOD)            3D EF:        29 % LV vol d, MOD A2C: 271.0 ml LV EDV:       241 ml LV vol d, MOD A4C: 217.0 ml LV ESV:       172 ml LV vol s, MOD A2C: 184.0 ml LV SV:        69 ml LV vol s, MOD A4C: 137.0 ml LV SV MOD  A2C:     87.0 ml LV SV MOD A4C:     217.0 ml LV SV MOD BP:      92.8 ml RIGHT VENTRICLE RV S prime:     12.20 cm/s TAPSE (M-mode): 1.9 cm LEFT ATRIUM             Index        RIGHT ATRIUM           Index LA Vol (A2C):   60.7 ml 29.47 ml/m  RA Area:     18.80 cm LA Vol (A4C):   65.5 ml 31.80 ml/m  RA Volume:   52.50 ml  25.49 ml/m LA Biplane Vol: 63.4 ml 30.78 ml/m  AORTIC VALVE AV Area (  Vmax):    1.28 cm AV Area (Vmean):   1.21 cm AV Area (VTI):     1.43 cm AV Vmax:           363.33 cm/s AV Vmean:          275.000 cm/s AV VTI:            0.726 m AV Peak Grad:      52.8 mmHg AV Mean Grad:      33.7 mmHg LVOT Vmax:         94.70 cm/s LVOT Vmean:        68.000 cm/s LVOT VTI:          0.212 m LVOT/AV VTI ratio: 0.29 AI PHT:            220 msec  AORTA Ao Root diam: 3.10 cm Ao Asc diam:  3.40 cm MITRAL VALVE MV Area (PHT): 6.83 cm     SHUNTS MV Decel Time: 111 msec     Systemic VTI:  0.21 m MR Peak grad: 71.9 mmHg     Systemic Diam: 2.50 cm MR Mean grad: 41.0 mmHg MR Vmax:      424.00 cm/s MR Vmean:     289.0 cm/s MV E velocity: 130.50 cm/s MV A velocity: 45.60 cm/s MV E/A ratio:  2.86 Kirk Ruths MD Electronically signed by Kirk Ruths MD Signature Date/Time: 01/20/2023/3:42:41 PM    Final    DG Chest 1 View  Result Date: 01/20/2023 CLINICAL DATA:  67 year old male with history of congestive heart failure. EXAM: CHEST  1 VIEW COMPARISON:  Chest x-ray 01/19/2023. FINDINGS: Lung volumes are normal. Trace bilateral pleural effusions. No confluent consolidative airspace disease. No pneumothorax. Cephalization of the pulmonary vasculature, without frank pulmonary edema. Heart size is mildly enlarged. Upper mediastinal contours are within normal limits. Old healed fractures of the posterolateral right seventh and eighth ribs. IMPRESSION: 1. Trace bilateral pleural effusions. 2. Cardiomegaly with pulmonary venous congestion. Electronically Signed   By: Vinnie Langton M.D.   On: 01/20/2023 09:08   DG Chest  1 View  Result Date: 01/19/2023 CLINICAL DATA:  Chest pain EXAM: CHEST  1 VIEW COMPARISON:  01/18/2023 FINDINGS: Cardiomegaly. Atelectasis or scarring at the bases. No acute consolidation. Possible tiny left effusion. No visible pneumothorax. IMPRESSION: Cardiomegaly with suspected tiny left effusion. Linear atelectasis or scarring at the bases. Electronically Signed   By: Donavan Foil M.D.   On: 01/19/2023 16:27    Cardiac Studies   Echo 01/20/2023  1. Calcified aortic valve with inderminant number of cusps; probable  severe AS with mean gradient as high as 37 mmHg; mild AI.   2. Left ventricular ejection fraction, by estimation, is 30 to 35%. The  left ventricle has moderate to severely decreased function. The left  ventricle demonstrates global hypokinesis. The left ventricular internal  cavity size was moderately dilated.  Left ventricular diastolic parameters are consistent with Grade III  diastolic dysfunction (restrictive).   3. Right ventricular systolic function is mildly reduced. The right  ventricular size is normal.   4. The mitral valve is degenerative. Mild mitral valve regurgitation. No  evidence of mitral stenosis.   5. The aortic valve is calcified. Aortic valve regurgitation is mild.  Severe aortic valve stenosis. Aortic regurgitation PHT measures 220 msec.  Aortic valve area, by VTI measures 1.43 cm. Aortic valve mean gradient  measures 33.7 mmHg. Aortic valve Vmax   measures 3.63 m/s.   6. The inferior vena cava is  dilated in size with >50% respiratory  variability, suggesting right atrial pressure of 8 mmHg.   Patient Profile     67 y.o. male with PMH of HTN, DM II, prior TIA, prior EtOH abuse and tobacco abuse who presented to the ED with chest pain and dyspnea.  He was ruled in for NSTEMI.   Assessment & Plan    NSTEMI             - Serial trop 1269 --> 1436, continue IV heparin. ASA and statin             - pending echocardiogram, tentatively planned to  proceed with cardiac catheterization on Monday. Risk and benefit explained by Dr. Angelena Form on Friday and again today including 1:200 chance of major bleeding, 1:500 chance of vascular injury, kidney injury or arrhythmia, 1:1000 chance of MI, stroke, loss of limb or life. Likely need to add RHC on top of Left heart cath for tomorrow.    Severe aortic stenosis: previous echo in 2016 suggested bicuspid aortic valve, pending repeat echo.    - Echo 01/20/2023 showed EF 30-35%, grade 3 DD, mildly reduced RV systolic function, mild MR, severe aortic stenosis with mild AI, mean aortic valve gradient 33.7 mmHg   LV dysfunction: EF 30-35%. Euvolemic on exam.   HTN: added lisinopril and coreg   DM II: Hgb A1C 5.4      For questions or updates, please contact [place] Please consult [email or web address] for contact info under        Signed, Almyra Deforest, [place]  01/21/2023, 7:49 AM    I have seen and examined the patient along with Almyra Deforest, PA.  I have reviewed the chart, notes and new data.  I agree with PA/NP's note.  Key new complaints: Developed shortness of breath and intrascapular discomfort when lying in bed, improved when sitting up. Key examination changes: No rales heard.  Regular rate and rhythm, normal S1, essentially absent S2, mid peaking aortic ejection murmur 2/6, no diastolic murmurs.  No edema or jugular venous distention. Key new findings / data: Echocardiogram shows moderate to severely reduced left ventricular systolic function with EF less than 35% and severe diastolic dysfunction with restrictive filling.  Aortic valve is probably bicuspid with fusion of the left and noncoronary cusps and is very heavily calcified, with restricted leaflet motion.  By color Doppler the aortic insufficiency appears mild, but the pressure half-time is very steep, probably due to high LVEDP.  Aortic valve gradients are consistent with severe aortic stenosis (gradients lower than expected due to poor LV  function).  Aortic valve area calculation is an overestimation due to over measurement of the left ventricular outflow tract diameter.  Although there is a global left ventricular hypokinesis there appears to be disproportionate hypokinesis of the inferior wall and of the left ventricular apex, suggesting multivessel coronary artery disease.  PLAN: Right and left heart catheterization tomorrow.  Suspect that he has multivessel CAD in addition to severe aortic stenosis due to bicuspid aortic valve.  He will probably best served by surgical aortic valve replacement and CABG. Shared Decision Making/Informed Consent The risks [stroke (1 in 1000), death (1 in 1000), kidney failure [usually temporary] (1 in 500), bleeding (1 in 200), allergic reaction [possibly serious] (1 in 200)], benefits (diagnostic support and management of coronary artery disease) and alternatives of a cardiac catheterization were discussed in detail with Mr. Mcbreen and he is willing to proceed.   Sanda Klein, MD, [facility] [phone] 01/21/2023, 10:45 AM

## 2023-01-21 NOTE — Progress Notes (Signed)
ANTICOAGULATION CONSULT NOTE-Follow Up Pharmacy Consult for Heparin Indication: chest pain/ACS   Allergies  Allergen Reactions   Naproxen Nausea Only   Sulfa Antibiotics Nausea Only    Patient Measurements: Height: 6' (182.9 cm) Weight: 81.7 kg (180 lb 1.6 oz) IBW/kg (Calculated) : 77.6 Heparin Dosing Weight: 81.6 kg  Vital Signs: Temp: 98.6 F (37 C) (01/28 1155) Temp Source: Oral (01/28 1155) BP: 108/74 (01/28 1155) Pulse Rate: 71 (01/28 1155)  Labs: Recent Labs    01/19/23 1543 01/19/23 1733 01/20/23 0114 01/20/23 1019 01/21/23 0054 01/21/23 0606 01/21/23 1040  HGB 12.5*  --  12.4*  --  12.5*  --   --   HCT 38.2*  --  39.0  --  37.6*  --   --   PLT 310  --  315  --  308  --   --   HEPARINUNFRC  --   --  <0.10*   < > 0.23* 0.56 0.48  CREATININE 0.92  --  0.94  --  0.95  --   --   TROPONINIHS 1,269* 1,436*  --   --   --   --   --    < > = values in this interval not displayed.     Estimated Creatinine Clearance: 86.2 mL/min (by C-G formula based on SCr of 0.95 mg/dL).  Assessment: 67 y.o. male with PMH for HTN, T2DM, gout, anxiety/depression, prior TIA presented with chest pain. Pharmacy consulted to dose heparin. Heparin level was 0.23 with Hgb in the 12s and plts WNL before rate change. Per RN no issues with drawing the level (drawn from opposite arm) and no signs/symptoms of bleeding. Daily heparin level lab was drawn and came back at 0.56, will ignore this due to timing of dose increase and level being drawn  1/28 PM update:  Heparin level therapeutic at 0.43  Goal of Therapy:  Heparin level 0.3-0.7 units/ml Monitor platelets by anticoagulation protocol: Yes   Plan:  Continue heparin at 1650 units/hr Re-check heparin level at 1700 Monitor CBC and for signs/symptoms of bleeding daily Follow up cath lab on Monday 01/22/2023   Sandford Craze, PharmD. [facility] PGY-1 01/21/2023 12:09 PM

## 2023-01-22 ENCOUNTER — Inpatient Hospital Stay (HOSPITAL_COMMUNITY): Payer: Medicare HMO

## 2023-01-22 ENCOUNTER — Encounter (HOSPITAL_COMMUNITY): Payer: Self-pay | Admitting: Cardiovascular Disease

## 2023-01-22 ENCOUNTER — Other Ambulatory Visit (HOSPITAL_COMMUNITY): Payer: Self-pay

## 2023-01-22 ENCOUNTER — Encounter (HOSPITAL_COMMUNITY)
Admission: EM | Disposition: A | Discharge status: Home / Self Care | Payer: Self-pay | Source: Home / Self Care | Attending: Surgery

## 2023-01-22 DIAGNOSIS — Z01810 Encounter for preprocedural cardiovascular examination: Secondary | ICD-10-CM

## 2023-01-22 DIAGNOSIS — I25110 Atherosclerotic heart disease of native coronary artery with unstable angina pectoris: Secondary | ICD-10-CM | POA: Diagnosis not present

## 2023-01-22 DIAGNOSIS — I5041 Acute combined systolic (congestive) and diastolic (congestive) heart failure: Secondary | ICD-10-CM | POA: Diagnosis not present

## 2023-01-22 DIAGNOSIS — I2510 Atherosclerotic heart disease of native coronary artery without angina pectoris: Secondary | ICD-10-CM | POA: Diagnosis not present

## 2023-01-22 DIAGNOSIS — I350 Nonrheumatic aortic (valve) stenosis: Secondary | ICD-10-CM | POA: Diagnosis not present

## 2023-01-22 DIAGNOSIS — I214 Non-ST elevation (NSTEMI) myocardial infarction: Secondary | ICD-10-CM | POA: Diagnosis not present

## 2023-01-22 HISTORY — PX: RIGHT HEART CATH AND CORONARY ANGIOGRAPHY: CATH118264

## 2023-01-22 LAB — CBC
HCT: 37.8 % — ABNORMAL LOW (ref 39.0–52.0)
Hemoglobin: 12.1 g/dL — ABNORMAL LOW (ref 13.0–17.0)
MCH: 27.4 pg (ref 26.0–34.0)
MCHC: 32 g/dL (ref 30.0–36.0)
MCV: 85.5 fL (ref 80.0–100.0)
Platelets: 303 10*3/uL (ref 150–400)
RBC: 4.42 MIL/uL (ref 4.22–5.81)
RDW: 12.8 % (ref 11.5–15.5)
WBC: 9.1 10*3/uL (ref 4.0–10.5)
nRBC: 0 % (ref 0.0–0.2)

## 2023-01-22 LAB — POCT I-STAT 7, (LYTES, BLD GAS, ICA,H+H)
Acid-Base Excess: 3 mmol/L — ABNORMAL HIGH (ref 0.0–2.0)
Bicarbonate: 26.8 mmol/L (ref 20.0–28.0)
Calcium, Ion: 1.21 mmol/L (ref 1.15–1.40)
HCT: 35 % — ABNORMAL LOW (ref 39.0–52.0)
Hemoglobin: 11.9 g/dL — ABNORMAL LOW (ref 13.0–17.0)
O2 Saturation: 92 %
Potassium: 3.6 mmol/L (ref 3.5–5.1)
Sodium: 139 mmol/L (ref 135–145)
TCO2: 28 mmol/L (ref 22–32)
pCO2 arterial: 38.2 mmHg (ref 32–48)
pH, Arterial: 7.453 — ABNORMAL HIGH (ref 7.35–7.45)
pO2, Arterial: 62 mmHg — ABNORMAL LOW (ref 83–108)

## 2023-01-22 LAB — TYPE AND SCREEN
ABO/RH(D): AB NEG
Antibody Screen: NEGATIVE

## 2023-01-22 LAB — GLUCOSE, CAPILLARY
Glucose-Capillary: 99 mg/dL (ref 70–99)
Glucose-Capillary: 80 mg/dL (ref 70–99)
Glucose-Capillary: 90 mg/dL (ref 70–99)
Glucose-Capillary: 85 mg/dL (ref 70–99)

## 2023-01-22 LAB — POCT I-STAT EG7
Acid-Base Excess: 4 mmol/L — ABNORMAL HIGH (ref 0.0–2.0)
Acid-Base Excess: 5 mmol/L — ABNORMAL HIGH (ref 0.0–2.0)
Bicarbonate: 29.9 mmol/L — ABNORMAL HIGH (ref 20.0–28.0)
Bicarbonate: 30.2 mmol/L — ABNORMAL HIGH (ref 20.0–28.0)
Calcium, Ion: 1.23 mmol/L (ref 1.15–1.40)
Calcium, Ion: 1.24 mmol/L (ref 1.15–1.40)
HCT: 35 % — ABNORMAL LOW (ref 39.0–52.0)
HCT: 35 % — ABNORMAL LOW (ref 39.0–52.0)
Hemoglobin: 11.9 g/dL — ABNORMAL LOW (ref 13.0–17.0)
Hemoglobin: 11.9 g/dL — ABNORMAL LOW (ref 13.0–17.0)
O2 Saturation: 59 %
O2 Saturation: 61 %
Potassium: 3.6 mmol/L (ref 3.5–5.1)
Potassium: 3.7 mmol/L (ref 3.5–5.1)
Sodium: 139 mmol/L (ref 135–145)
Sodium: 140 mmol/L (ref 135–145)
TCO2: 31 mmol/L (ref 22–32)
TCO2: 32 mmol/L (ref 22–32)
pCO2, Ven: 47.9 mmHg (ref 44–60)
pCO2, Ven: 47.9 mmHg (ref 44–60)
pH, Ven: 7.403 (ref 7.25–7.43)
pH, Ven: 7.407 (ref 7.25–7.43)
pO2, Ven: 31 mmHg — CL (ref 32–45)
pO2, Ven: 32 mmHg (ref 32–45)

## 2023-01-22 LAB — ABO/RH: ABO/RH(D): AB NEG

## 2023-01-22 LAB — BASIC METABOLIC PANEL
Anion gap: 9 (ref 5–15)
BUN: 11 mg/dL (ref 8–23)
CO2: 25 mmol/L (ref 22–32)
Calcium: 8.6 mg/dL — ABNORMAL LOW (ref 8.9–10.3)
Chloride: 99 mmol/L (ref 98–111)
Creatinine, Ser: 0.98 mg/dL (ref 0.61–1.24)
GFR, Estimated: >60 mL/min (ref >60)
Glucose, Bld: 112 mg/dL — ABNORMAL HIGH (ref 70–99)
Potassium: 4.1 mmol/L (ref 3.5–5.1)
Sodium: 133 mmol/L — ABNORMAL LOW (ref 135–145)

## 2023-01-22 LAB — VAS US DOPPLER PRE CABG

## 2023-01-22 LAB — MAGNESIUM: Magnesium: 2.4 mg/dL (ref 1.7–2.4)

## 2023-01-22 LAB — PHOSPHORUS: Phosphorus: 4.1 mg/dL (ref 2.5–4.6)

## 2023-01-22 LAB — HEPARIN LEVEL (UNFRACTIONATED): Heparin Unfractionated: 0.3 IU/mL (ref 0.30–0.70)

## 2023-01-22 LAB — SARS CORONAVIRUS 2 BY RT PCR: SARS Coronavirus 2 by RT PCR: NEGATIVE

## 2023-01-22 SURGERY — RIGHT HEART CATH AND CORONARY ANGIOGRAPHY
Anesthesia: LOCAL

## 2023-01-22 MED ORDER — ACETAMINOPHEN 325 MG PO TABS: 650.0000 mg | ORAL_TABLET | ORAL | Status: DC | PRN

## 2023-01-22 MED ORDER — NITROGLYCERIN 1 MG/10 ML FOR IR/CATH LAB
INTRA_ARTERIAL | Status: AC
  Filled 2023-01-22: qty 10

## 2023-01-22 MED ORDER — NOREPINEPHRINE 4 MG/250ML-% IV SOLN
0.0000 ug/min | INTRAVENOUS | Status: AC
  Administered 2023-01-23: 2 ug/min via INTRAVENOUS
  Administered 2023-01-23: 7 ug/min via INTRAVENOUS
  Filled 2023-01-22: qty 250

## 2023-01-22 MED ORDER — ASPIRIN 81 MG PO CHEW
CHEWABLE_TABLET | ORAL | Status: DC | PRN
  Administered 2023-01-22: 81 mg via ORAL

## 2023-01-22 MED ORDER — DEXMEDETOMIDINE HCL IN NACL 400 MCG/100ML IV SOLN
0.1000 ug/kg/h | INTRAVENOUS | Status: AC
  Administered 2023-01-23: .7 ug/kg/h via INTRAVENOUS
  Filled 2023-01-22: qty 100

## 2023-01-22 MED ORDER — BISACODYL 5 MG PO TBEC
5.0000 mg | DELAYED_RELEASE_TABLET | Freq: Once | ORAL | Status: AC
  Administered 2023-01-22: 5 mg via ORAL
  Filled 2023-01-22: qty 1

## 2023-01-22 MED ORDER — HEPARIN SODIUM (PORCINE) 1000 UNIT/ML IJ SOLN
INTRAMUSCULAR | Status: DC | PRN
  Administered 2023-01-22: 4000 [IU] via INTRAVENOUS

## 2023-01-22 MED ORDER — IOHEXOL 350 MG/ML SOLN
INTRAVENOUS | Status: DC | PRN
  Administered 2023-01-22: 65 mL

## 2023-01-22 MED ORDER — VERAPAMIL HCL 2.5 MG/ML IV SOLN
INTRA_ARTERIAL | Status: DC | PRN
  Administered 2023-01-22: 10 mL via INTRA_ARTERIAL

## 2023-01-22 MED ORDER — LIDOCAINE HCL (PF) 1 % IJ SOLN
INTRAMUSCULAR | Status: AC
  Filled 2023-01-22: qty 30

## 2023-01-22 MED ORDER — METOPROLOL TARTRATE 12.5 MG HALF TABLET
12.5000 mg | ORAL_TABLET | Freq: Once | ORAL | Status: AC
  Administered 2023-01-23: 12.5 mg via ORAL
  Filled 2023-01-22: qty 1

## 2023-01-22 MED ORDER — SODIUM CHLORIDE 0.9% FLUSH
3.0000 mL | Freq: Two times a day (BID) | INTRAVENOUS | Status: DC
  Administered 2023-01-22 (×2): 3 mL via INTRAVENOUS

## 2023-01-22 MED ORDER — CHLORHEXIDINE GLUCONATE CLOTH 2 % EX PADS
6.0000 | MEDICATED_PAD | Freq: Once | CUTANEOUS | Status: AC
  Administered 2023-01-22: 6 via TOPICAL

## 2023-01-22 MED ORDER — TRANEXAMIC ACID (OHS) BOLUS VIA INFUSION
15.0000 mg/kg | INTRAVENOUS | Status: AC
  Administered 2023-01-23: 1198.5 mg via INTRAVENOUS
  Filled 2023-01-22: qty 1199

## 2023-01-22 MED ORDER — EPINEPHRINE HCL 5 MG/250ML IV SOLN IN NS
0.0000 ug/min | INTRAVENOUS | Status: AC
  Administered 2023-01-23: 1 ug/min via INTRAVENOUS
  Filled 2023-01-22: qty 250

## 2023-01-22 MED ORDER — VANCOMYCIN HCL 1250 MG/250ML IV SOLN
1250.0000 mg | INTRAVENOUS | Status: AC
  Administered 2023-01-23: 1250 mg via INTRAVENOUS
  Filled 2023-01-22: qty 250

## 2023-01-22 MED ORDER — HYDRALAZINE HCL 20 MG/ML IJ SOLN: 10.0000 mg | INTRAMUSCULAR | Status: AC | PRN

## 2023-01-22 MED ORDER — ATORVASTATIN CALCIUM 80 MG PO TABS
80.0000 mg | ORAL_TABLET | Freq: Every day | ORAL | Status: DC
  Administered 2023-01-22: 80 mg via ORAL
  Filled 2023-01-22: qty 1

## 2023-01-22 MED ORDER — SODIUM CHLORIDE 0.9 % IV SOLN: 250.0000 mL | INTRAVENOUS | Status: DC | PRN

## 2023-01-22 MED ORDER — MANNITOL 20 % IV SOLN
INTRAVENOUS | Status: DC
  Filled 2023-01-22: qty 13

## 2023-01-22 MED ORDER — MORPHINE SULFATE (PF) 2 MG/ML IV SOLN: 2.0000 mg | INTRAVENOUS | Status: DC | PRN

## 2023-01-22 MED ORDER — LABETALOL HCL 5 MG/ML IV SOLN: 10.0000 mg | INTRAVENOUS | Status: AC | PRN

## 2023-01-22 MED ORDER — TEMAZEPAM 15 MG PO CAPS: 15.0000 mg | ORAL_CAPSULE | Freq: Once | ORAL | Status: DC | PRN

## 2023-01-22 MED ORDER — NITROGLYCERIN IN D5W 200-5 MCG/ML-% IV SOLN
2.0000 ug/min | INTRAVENOUS | Status: DC
  Filled 2023-01-22: qty 250

## 2023-01-22 MED ORDER — IOHEXOL 350 MG/ML SOLN
100.0000 mL | Freq: Once | INTRAVENOUS | Status: AC | PRN
  Administered 2023-01-22: 100 mL via INTRAVENOUS

## 2023-01-22 MED ORDER — POTASSIUM CHLORIDE 2 MEQ/ML IV SOLN
80.0000 meq | INTRAVENOUS | Status: DC
  Filled 2023-01-22: qty 40

## 2023-01-22 MED ORDER — CEFAZOLIN SODIUM-DEXTROSE 2-4 GM/100ML-% IV SOLN
2.0000 g | INTRAVENOUS | Status: AC
  Administered 2023-01-23: 2 g via INTRAVENOUS
  Filled 2023-01-22: qty 100

## 2023-01-22 MED ORDER — HEPARIN 30,000 UNITS/1000 ML (OHS) CELLSAVER SOLUTION
Status: DC
  Filled 2023-01-22: qty 1000

## 2023-01-22 MED ORDER — TRANEXAMIC ACID (OHS) PUMP PRIME SOLUTION
2.0000 mg/kg | INTRAVENOUS | Status: DC
  Filled 2023-01-22: qty 1.6

## 2023-01-22 MED ORDER — PLASMA-LYTE A IV SOLN
INTRAVENOUS | Status: DC
  Filled 2023-01-22: qty 2.5

## 2023-01-22 MED ORDER — FUROSEMIDE 10 MG/ML IJ SOLN
40.0000 mg | Freq: Once | INTRAMUSCULAR | Status: AC
  Administered 2023-01-22: 40 mg via INTRAVENOUS
  Filled 2023-01-22: qty 4

## 2023-01-22 MED ORDER — SODIUM CHLORIDE 0.9 % IV SOLN: INTRAVENOUS | Status: AC

## 2023-01-22 MED ORDER — HEPARIN SODIUM (PORCINE) 1000 UNIT/ML IJ SOLN
INTRAMUSCULAR | Status: AC
  Filled 2023-01-22: qty 10

## 2023-01-22 MED ORDER — VERAPAMIL HCL 2.5 MG/ML IV SOLN
INTRAVENOUS | Status: AC
  Filled 2023-01-22: qty 2

## 2023-01-22 MED ORDER — TRANEXAMIC ACID 1000 MG/10ML IV SOLN
1.5000 mg/kg/h | INTRAVENOUS | Status: AC
  Administered 2023-01-23: 1.5 mg/kg/h via INTRAVENOUS
  Filled 2023-01-22: qty 25

## 2023-01-22 MED ORDER — INSULIN REGULAR(HUMAN) IN NACL 100-0.9 UT/100ML-% IV SOLN
INTRAVENOUS | Status: AC
  Administered 2023-01-23: 1.1 [IU]/h via INTRAVENOUS
  Filled 2023-01-22: qty 100

## 2023-01-22 MED ORDER — HEPARIN (PORCINE) IN NACL 1000-0.9 UT/500ML-% IV SOLN
INTRAVENOUS | Status: DC | PRN
  Administered 2023-01-22 (×2): 500 mL

## 2023-01-22 MED ORDER — ASPIRIN 81 MG PO CHEW
CHEWABLE_TABLET | ORAL | Status: AC
  Filled 2023-01-22: qty 1

## 2023-01-22 MED ORDER — CHLORHEXIDINE GLUCONATE CLOTH 2 % EX PADS
6.0000 | MEDICATED_PAD | Freq: Once | CUTANEOUS | Status: AC
  Administered 2023-01-23: 6 via TOPICAL

## 2023-01-22 MED ORDER — CHLORHEXIDINE GLUCONATE 0.12 % MT SOLN
15.0000 mL | Freq: Once | OROMUCOSAL | Status: AC
  Administered 2023-01-23: 15 mL via OROMUCOSAL
  Filled 2023-01-22: qty 15

## 2023-01-22 MED ORDER — SODIUM CHLORIDE 0.9% FLUSH
3.0000 mL | INTRAVENOUS | Status: DC | PRN
  Administered 2023-01-22: 3 mL via INTRAVENOUS

## 2023-01-22 MED ORDER — PHENYLEPHRINE HCL-NACL 20-0.9 MG/250ML-% IV SOLN
30.0000 ug/min | INTRAVENOUS | Status: AC
  Administered 2023-01-23: 20 ug/min via INTRAVENOUS
  Filled 2023-01-22: qty 250

## 2023-01-22 MED ORDER — LIDOCAINE HCL (PF) 1 % IJ SOLN
INTRAMUSCULAR | Status: DC | PRN
  Administered 2023-01-22 (×2): 2 mL

## 2023-01-22 MED ORDER — HEPARIN (PORCINE) IN NACL 1000-0.9 UT/500ML-% IV SOLN
INTRAVENOUS | Status: AC
  Filled 2023-01-22: qty 1000

## 2023-01-22 MED ORDER — DIAZEPAM 5 MG PO TABS
5.0000 mg | ORAL_TABLET | Freq: Once | ORAL | Status: AC
  Administered 2023-01-23: 5 mg via ORAL
  Filled 2023-01-22: qty 1

## 2023-01-22 MED ORDER — MILRINONE LACTATE IN DEXTROSE 20-5 MG/100ML-% IV SOLN
0.3000 ug/kg/min | INTRAVENOUS | Status: AC
  Administered 2023-01-23: .25 ug/kg/min via INTRAVENOUS
  Filled 2023-01-22: qty 100

## 2023-01-22 MED ORDER — ASPIRIN 81 MG PO CHEW: 81.0000 mg | CHEWABLE_TABLET | Freq: Every day | ORAL | Status: DC

## 2023-01-22 MED ORDER — ONDANSETRON HCL 4 MG/2ML IJ SOLN: 4.0000 mg | Freq: Four times a day (QID) | INTRAMUSCULAR | Status: DC | PRN

## 2023-01-22 SURGICAL SUPPLY — 14 items
CATH 5FR JL3.5 JR4 ANG PIG MP (CATHETERS) IMPLANT
CATH BALLN WEDGE 5F 110CM (CATHETERS) IMPLANT
DEVICE RAD COMP TR BAND LRG (VASCULAR PRODUCTS) IMPLANT
GLIDESHEATH SLEND A-KIT 6F 22G (SHEATH) IMPLANT
GLIDESHEATH SLEND SS 6F .021 (SHEATH) IMPLANT
GUIDEWIRE INQWIRE 1.5J.035X260 (WIRE) IMPLANT
INQWIRE 1.5J .035X260CM (WIRE) ×1
KIT HEART LEFT (KITS) ×1 IMPLANT
PACK CARDIAC CATHETERIZATION (CUSTOM PROCEDURE TRAY) ×1 IMPLANT
SHEATH GLIDE SLENDER 4/5FR (SHEATH) IMPLANT
TRANSDUCER W/STOPCOCK (MISCELLANEOUS) ×1 IMPLANT
TUBING CIL FLEX 10 FLL-RA (TUBING) ×1 IMPLANT
WIRE EMERALD ST .035X260CM (WIRE) IMPLANT
WIRE HI TORQ VERSACORE-J 145CM (WIRE) IMPLANT

## 2023-01-22 NOTE — Progress Notes (Signed)
Rounding Note    Patient Name: Raymond Roberson Date of Encounter: 01/22/2023  [place] Cardiologist: None   Subjective   Seen immediately after cardiac catheterization.  Angiographic images reviewed with Dr. Gwenlyn Found and Dr. Burt Knack. Still having problems with chest discomfort or shortness of breath when supine.  PAWP at cath was 26 mmHg.  Unable to cross the aortic valve.  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  Inpatient Medications    Scheduled Meds:  allopurinol  300 mg Oral Daily   ALPRAZolam  0.5 mg Oral BID   aspirin EC  81 mg Oral Daily   atorvastatin  80 mg Oral Daily   carvedilol  3.125 mg Oral BID WC   citalopram  20 mg Oral Daily   cycloSPORINE  1 drop Both Eyes Daily   HYDROcodone-acetaminophen  1 tablet Oral QID   insulin aspart  0-15 Units Subcutaneous TID WC   lisinopril  5 mg Oral Daily   loratadine  10 mg Oral Daily   pantoprazole  40 mg Oral Daily   sodium chloride flush  3 mL Intravenous Q12H   sodium chloride flush  3 mL Intravenous Q12H   sodium chloride flush  3 mL Intravenous Q12H   Continuous Infusions:  sodium chloride     sodium chloride     sodium chloride     heparin 1,650 Units/hr (01/21/23 1800)   PRN Meds: sodium chloride, sodium chloride, acetaminophen, acetaminophen, hydrALAZINE, hydrocortisone, labetalol, morphine injection, nitroGLYCERIN, ondansetron (ZOFRAN) IV, ondansetron (ZOFRAN) IV, sodium chloride flush, sodium chloride flush   Vital Signs    Vitals:   01/22/23 0821 01/22/23 0826 01/22/23 0831 01/22/23 0900  BP: 100/67 104/67 107/76 107/76  Pulse: 69 67 66 69  Resp: 17 (!) 21 15 (!) 21  Temp:    97.7 F (36.5 C)  TempSrc:    Oral  SpO2: 92% 93% 96% 100%  Weight:      Height:        Intake/Output Summary (Last 24 hours) at 01/22/2023 0923 Last data filed at 01/22/2023 0400 Gross per 24 hour  Intake 141.2 ml  Output  600 ml  Net -458.8 ml      01/22/2023    5:49 AM 01/21/2023    6:51 AM 01/20/2023    5:08 AM  Last 3 Weights  Weight (lbs) 176 lb 1.6 oz 180 lb 1.6 oz 184 lb 11.9 oz  Weight (kg) 79.878 kg 81.693 kg 83.8 kg      Telemetry    Sinus rhythm- Personally Reviewed  ECG    No new tracing- Personally Reviewed  Physical Exam   GEN: No acute distress.   Neck: No JVD Cardiac: RRR, no murmurs, rubs, or gallops.  Respiratory: Clear to auscultation bilaterally. GI: Soft, nontender, non-distended  MS: No edema; No deformity. Neuro:  Nonfocal  Psych: Normal affect   Labs    High Sensitivity Troponin:   Recent Labs  Lab 01/19/23 1543 01/19/23 1733  TROPONINIHS 1,269* 1,436*     Chemistry Recent Labs  Lab 01/19/23 1543 01/20/23 0114 01/21/23 0054 01/22/23 0154  NA 137 138 137 133*  K 3.4* 3.6 3.6 4.1  CL 104 103 102 99  CO2 '24 27 27 25  '$ GLUCOSE 144* 101* 122* 112*  BUN '8 9 10 11  '$ CREATININE 0.92 0.94 0.95 0.98  CALCIUM 9.1 8.9 8.9 8.6*  MG  --   --   --  2.4  PROT 6.4*  --   --   --   ALBUMIN 3.3*  --   --   --   AST 34  --   --   --   ALT 20  --   --   --   ALKPHOS 41  --   --   --   BILITOT 1.0  --   --   --   GFRNONAA >60 >60 >60 >60  ANIONGAP '9 8 8 9    '$ Lipids No results for input(s): "CHOL", "TRIG", "HDL", "LABVLDL", "LDLCALC", "CHOLHDL" in the last 168 hours.  Hematology Recent Labs  Lab 01/20/23 0114 01/21/23 0054 01/22/23 0154  WBC 8.1 9.5 9.1  RBC 4.55 4.42 4.42  HGB 12.4* 12.5* 12.1*  HCT 39.0 37.6* 37.8*  MCV 85.7 85.1 85.5  MCH 27.3 28.3 27.4  MCHC 31.8 33.2 32.0  RDW 13.0 13.0 12.8  PLT 315 308 303   Thyroid  Recent Labs  Lab 01/19/23 1733  TSH 1.447    BNP Recent Labs  Lab 01/19/23 1543  BNP 897.6*    DDimer  Recent Labs  Lab 01/19/23 1733  DDIMER 0.37     Radiology    CARDIAC CATHETERIZATION  Result Date: 01/22/2023 Images from the original result were not included.   Ost RCA lesion is 100% stenosed.   Prox LAD to Mid  LAD lesion is 90% stenosed. Raymond Roberson is a 67 y.o. male  [id number] LOCATION:  FACILITY: [facility] PHYSICIAN: Quay Burow, M.D. [birth year] DATE OF PROCEDURE:  01/22/2023 DATE OF DISCHARGE: CARDIAC CATHETERIZATION / RHC History obtained from chart review.67 y.o. male with PMH of HTN, DM II, prior TIA, prior EtOH abuse and tobacco abuse who presented to the ED with chest pain and dyspnea.  He was ruled in for NSTEMI his troponins were mildly elevated.  His echo revealed moderate LV dysfunction with an EF in the 35% range with an inferior wall motion abnormality out of proportion to the global hypokinesia.  He also has severe aortic stenosis and a bicuspid aortic valve.  He presents now for right left heart cath to define his anatomy and physiology. HEMODYNAMICS:  1: Right atrial pressure-9/8 2: Right ventricular pressure-40/0 3: Pulmonary artery pressure-38/10, mean 24 4: Pulmonary wedge pressure-A-wave 26, V wave 26, mean 17 5: Cardiac output-5.15 L/min with an index of 2.53 L/min/m   Raymond Roberson has severe aortic stenosis (bicuspid aortic valve) with moderately severe LV dysfunction and two-vessel disease.  He has a large dominant RCA that is occluded at the ostium with grade 3 left-to-right collaterals and a high-grade calcified proximal LAD stenosis.  The case has been reviewed with Dr. Sallyanne Kuster, the patient's attending cardiologist, and Dr. Burt Knack, structural interventionalists who feel best option is CABG/SAVR.  The radial sheath was removed and a TR band was placed on the right wrist to achieve patent hemostasis.  Patient left lab in stable condition.  Patient will return to his room and TCTS will be obtained. Quay Burow. MD, [facility] 01/22/2023 8:39 AM    ECHOCARDIOGRAM COMPLETE  Result Date: 01/20/2023    ECHOCARDIOGRAM REPORT   Patient Name:   Raymond Roberson Date of Exam: 01/20/2023 Medical Rec #:  [id number]        Height:       72.0 in Accession #:    [id number]       Weight:       184.7 lb Date of  Birth:  [birth year]  BSA:          2.060 m Patient Age:    67 years         BP:           113/63 mmHg Patient Gender: M                HR:           80 bpm. Exam Location:  Inpatient Procedure: 2D Echo, Cardiac Doppler, Color Doppler and Intracardiac            Opacification Agent Indications:    CHF-Acute Systolic M84.13  History:        Patient has prior history of Echocardiogram examinations, most                 recent 07/09/2013. Stroke; Risk Factors:Hypertension, Diabetes                 and Sleep Apnea. History of bicuspid aortic valve.  Sonographer:    Darlina Sicilian RDCS Referring Phys: [id number] [place]  1. Calcified aortic valve with inderminant number of cusps; probable severe AS with mean gradient as high as 37 mmHg; mild AI.  2. Left ventricular ejection fraction, by estimation, is 30 to 35%. The left ventricle has moderate to severely decreased function. The left ventricle demonstrates global hypokinesis. The left ventricular internal cavity size was moderately dilated. Left ventricular diastolic parameters are consistent with Grade III diastolic dysfunction (restrictive).  3. Right ventricular systolic function is mildly reduced. The right ventricular size is normal.  4. The mitral valve is degenerative. Mild mitral valve regurgitation. No evidence of mitral stenosis.  5. The aortic valve is calcified. Aortic valve regurgitation is mild. Severe aortic valve stenosis. Aortic regurgitation PHT measures 220 msec. Aortic valve area, by VTI measures 1.43 cm. Aortic valve mean gradient measures 33.7 mmHg. Aortic valve Vmax  measures 3.63 m/s.  6. The inferior vena cava is dilated in size with >50% respiratory variability, suggesting right atrial pressure of 8 mmHg. FINDINGS  Left Ventricle: Left ventricular ejection fraction, by estimation, is 30 to 35%. The left ventricle has moderate to severely decreased function. The left ventricle demonstrates global hypokinesis. Definity contrast  agent was given IV to delineate the left ventricular endocardial borders. The left ventricular internal cavity size was moderately dilated. There is no left ventricular hypertrophy. Left ventricular diastolic parameters are consistent with Grade III diastolic dysfunction (restrictive). Right Ventricle: The right ventricular size is normal. Right ventricular systolic function is mildly reduced. Left Atrium: Left atrial size was normal in size. Right Atrium: Right atrial size was normal in size. Pericardium: There is no evidence of pericardial effusion. Mitral Valve: The mitral valve is degenerative in appearance. Mild mitral annular calcification. Mild mitral valve regurgitation. No evidence of mitral valve stenosis. Tricuspid Valve: The tricuspid valve is grossly normal. Tricuspid valve regurgitation is trivial. No evidence of tricuspid stenosis. Aortic Valve: The aortic valve is calcified. Aortic valve regurgitation is mild. Aortic regurgitation PHT measures 220 msec. Severe aortic stenosis is present. Aortic valve mean gradient measures 33.7 mmHg. Aortic valve peak gradient measures 52.8 mmHg. Aortic valve area, by VTI measures 1.43 cm. Pulmonic Valve: The pulmonic valve was grossly normal. Pulmonic valve regurgitation is trivial. No evidence of pulmonic stenosis. Aorta: The aortic root and ascending aorta are structurally normal, with no evidence of dilitation. Venous: The inferior vena cava is dilated in size with greater than 50% respiratory variability, suggesting right atrial pressure of 8  mmHg. IAS/Shunts: No atrial level shunt detected by color flow Doppler. Additional Comments: Calcified aortic valve with inderminant number of cusps; probable severe AS with mean gradient as high as 37 mmHg; mild AI.  LEFT VENTRICLE PLAX 2D LVIDd:         6.20 cm      Diastology LVIDs:         5.00 cm      LV e' medial:    4.60 cm/s LV PW:         0.90 cm      LV E/e' medial:  28.4 LV IVS:        0.90 cm      LV e'  lateral:   5.36 cm/s LVOT diam:     2.50 cm      LV E/e' lateral: 24.3 LV SV:         104 LV SV Index:   51 LVOT Area:     4.91 cm                              3D Volume EF: LV Volumes (MOD)            3D EF:        29 % LV vol d, MOD A2C: 271.0 ml LV EDV:       241 ml LV vol d, MOD A4C: 217.0 ml LV ESV:       172 ml LV vol s, MOD A2C: 184.0 ml LV SV:        69 ml LV vol s, MOD A4C: 137.0 ml LV SV MOD A2C:     87.0 ml LV SV MOD A4C:     217.0 ml LV SV MOD BP:      92.8 ml RIGHT VENTRICLE RV S prime:     12.20 cm/s TAPSE (M-mode): 1.9 cm LEFT ATRIUM             Index        RIGHT ATRIUM           Index LA Vol (A2C):   60.7 ml 29.47 ml/m  RA Area:     18.80 cm LA Vol (A4C):   65.5 ml 31.80 ml/m  RA Volume:   52.50 ml  25.49 ml/m LA Biplane Vol: 63.4 ml 30.78 ml/m  AORTIC VALVE AV Area (Vmax):    1.28 cm AV Area (Vmean):   1.21 cm AV Area (VTI):     1.43 cm AV Vmax:           363.33 cm/s AV Vmean:          275.000 cm/s AV VTI:            0.726 m AV Peak Grad:      52.8 mmHg AV Mean Grad:      33.7 mmHg LVOT Vmax:         94.70 cm/s LVOT Vmean:        68.000 cm/s LVOT VTI:          0.212 m LVOT/AV VTI ratio: 0.29 AI PHT:            220 msec  AORTA Ao Root diam: 3.10 cm Ao Asc diam:  3.40 cm MITRAL VALVE MV Area (PHT): 6.83 cm     SHUNTS MV Decel Time: 111 msec     Systemic VTI:  0.21 m MR Peak grad: 71.9 mmHg  Systemic Diam: 2.50 cm MR Mean grad: 41.0 mmHg MR Vmax:      424.00 cm/s MR Vmean:     289.0 cm/s MV E velocity: 130.50 cm/s MV A velocity: 45.60 cm/s MV E/A ratio:  2.86 Kirk Ruths MD Electronically signed by Kirk Ruths MD Signature Date/Time: 01/20/2023/3:42:41 PM    Final     Cardiac Studies   Echo 01/20/2023  1. Calcified aortic valve with inderminant number of cusps; probable  severe AS with mean gradient as high as 37 mmHg; mild AI.   2. Left ventricular ejection fraction, by estimation, is 30 to 35%. The  left ventricle has moderate to severely decreased function. The left   ventricle demonstrates global hypokinesis. The left ventricular internal  cavity size was moderately dilated.  Left ventricular diastolic parameters are consistent with Grade III  diastolic dysfunction (restrictive).   3. Right ventricular systolic function is mildly reduced. The right  ventricular size is normal.   4. The mitral valve is degenerative. Mild mitral valve regurgitation. No  evidence of mitral stenosis.   5. The aortic valve is calcified. Aortic valve regurgitation is mild.  Severe aortic valve stenosis. Aortic regurgitation PHT measures 220 msec.  Aortic valve area, by VTI measures 1.43 cm. Aortic valve mean gradient  measures 33.7 mmHg. Aortic valve Vmax   measures 3.63 m/s.   6. The inferior vena cava is dilated in size with >50% respiratory  variability, suggesting right atrial pressure of 8 mmHg.   Patient Profile     67 y.o. male with PMH of severe aortic stenosis due to bicuspid aortic valve, HTN, DM II, prior TIA, prior EtOH abuse and tobacco abuse who presented to the ED with chest pain and dyspnea, found to have NSTEMI and moderate to severely reduced left ventricular systolic function with regional wall motion abnormalities.  Assessment & Plan    Severe AS: Very high gradients despite depressed left ventricular systolic function.  (Calculated aortic valve area on echo report is erroneous due to overestimation of LVOT diameter).  Will require AVR.  Due to the presence of very heavily calcified asymmetric bicuspid aortic valve as well as the presence of significant CAD he will be better served by CABG plus surgical AVR rather than TAVR.  No evidence of aortic root dilation on echo, but this would be better evaluated by CT before he undergoes surgery.  Recommend dental evaluation as well. CAD: Culprit vessel of NSTEMI may be the right coronary artery, although there are features of chronic total occlusion with good left-to-right collateral formation.  Unable to  revascularize the RCA with percutaneous means.  Also has approximately stenosis.  Better served by bypass surgery.  CTS consultation called. CHF: still hypervolemic. Will give another dose of diuretic. DM2: Remarkably good control, probably due to 60 pound unexplained weight loss over the last year.     For questions or updates, please contact [place] Please consult [email or web address] for contact info under        Signed, Sanda Klein, MD  01/22/2023, 9:23 AM

## 2023-01-22 NOTE — H&P (View-Only) (Signed)
[address]       [place] [address]             [phone]        Brylin A Youngman [place] Medical Record #[id number] Date of Birth: May 05, 1956  Referring: Dr. Daiva Nakayama Primary Care: Caryl Bis, MD Primary Cardiologist:None  Chief Complaint:    Chief Complaint  Patient presents with   Chest Pain    History of Present Illness:      Raymond Roberson is a 67 yo male with history of CVA, HTN, HLD,  Gout, GERD, Type 2 DM, Anxiety/Depression, Chronic Pain, Chewing Tobacco, and Hisotry of Alcohol abuse.  The patient presented to an OSH ED in [place] with complaints of chest pain and shortness of breath.  Workup revealed elevated Troponin level.  Admission was recommended but patient signed out AMA.  He presented to [facility] ED on 01/19/2023 with a 3-4 day history of shortness of breath and chest pain.  The symptoms were mostly described as being exertional.  The patient is disabled due to his back.  He also states he needs both knees replaced.  The patient has had some weight loss of about 40 lbs in the past 6 months.  The patient has his own teeth.  He was last evaluated by a dentist 6-12 months ago and had a filling.  He states he no longer drinks regularly, but he may occasionally have a beer.  He uses chewing tobacco but has never smoked.  He is able to walk to his mailbox but does get short of breath.  He is unable to walk up stairs due to his knees.  He has to take a break to get his house work done.  He has a family history of CAD with his father having died in his 52s.  He is currently experiencing occasional chest pain.   He understands he needs surgery as there are not other options to fix his problems.  Current Activity/ Functional Status: Patient is independent with mobility/ambulation, transfers, ADL's, IADL's.   Zubrod Score: At the time of surgery this patient's most appropriate activity status/level should be described as: '[]'$     0    Normal  activity, no symptoms '[x]'$     1    Restricted in physical strenuous activity but ambulatory, able to do out light work '[]'$     2    Ambulatory and capable of self care, unable to do work activities, up and about                 more than 50%  Of the time                            '[]'$     3    Only limited self care, in bed greater than 50% of waking hours '[]'$     4    Completely disabled, no self care, confined to bed or chair '[]'$     5    Moribund  Past Medical History:  Diagnosis Date   Acute angina ([place])    Bicuspid aortic valve    Chronic alcohol abuse    Classic migraine 09/02/2013   CVA (cerebral vascular accident) ([place])    Diabetes mellitus without complication ([place])    Dizziness and giddiness 09/02/2013   GERD (gastroesophageal reflux disease)    Gouty arthritis    Hypertension    Major  depression    Other and unspecified hyperlipidemia    Peptic ulcer disease    Sleep apnea     Past Surgical History:  Procedure Laterality Date   BIOPSY  07/21/2022   Procedure: BIOPSY;  Surgeon: Harvel Quale, MD;  Location: [facility];  Service: Gastroenterology;;   CIRCUMCISION     COLONOSCOPY WITH PROPOFOL N/A 07/21/2022   Procedure: COLONOSCOPY WITH PROPOFOL;  Surgeon: Harvel Quale, MD;  Location: [facility];  Service: Gastroenterology;  Laterality: N/A;  11:15 ASA 2   CYST REMOVAL TRUNK  11/24/2012   back   INGUINAL HERNIA REPAIR Bilateral    POLYPECTOMY  07/21/2022   Procedure: POLYPECTOMY;  Surgeon: Montez Morita, Quillian Quince, MD;  Location: [facility];  Service: Gastroenterology;;   RHINOPLASTY      Social History   Tobacco Use  Smoking Status Never  Smokeless Tobacco Current   Types: Snuff    Social History   Substance and Sexual Activity  Alcohol Use Not Currently     Allergies  Allergen Reactions   Naproxen Nausea Only   Sulfa Antibiotics Nausea Only    Current Facility-Administered Medications  Medication Dose Route Frequency  Provider Last Rate Last Admin   0.9 %  sodium chloride infusion  250 mL Intravenous PRN Wynetta Fines T, MD       0.9 %  sodium chloride infusion   Intravenous Continuous Lorretta Harp, MD       0.9 %  sodium chloride infusion  250 mL Intravenous PRN Lorretta Harp, MD       acetaminophen (TYLENOL) tablet 650 mg  650 mg Oral Q4H PRN Wynetta Fines T, MD       acetaminophen (TYLENOL) tablet 650 mg  650 mg Oral Q4H PRN Lorretta Harp, MD       allopurinol (ZYLOPRIM) tablet 300 mg  300 mg Oral Daily Wynetta Fines T, MD   300 mg at 01/21/23 1015   ALPRAZolam Duanne Moron) tablet 0.5 mg  0.5 mg Oral BID Wynetta Fines T, MD   0.5 mg at 01/22/23 1011   aspirin EC tablet 81 mg  81 mg Oral Daily Wynetta Fines T, MD   81 mg at 01/21/23 1015   atorvastatin (LIPITOR) tablet 80 mg  80 mg Oral Daily Lorretta Harp, MD   80 mg at 01/22/23 1011   carvedilol (COREG) tablet 3.125 mg  3.125 mg Oral BID WC Wynetta Fines T, MD   3.125 mg at 01/22/23 1010   citalopram (CELEXA) tablet 20 mg  20 mg Oral Daily Wynetta Fines T, MD   20 mg at 01/22/23 1011   cycloSPORINE (RESTASIS) 0.05 % ophthalmic emulsion 1 drop  1 drop Both Eyes Daily Wynetta Fines T, MD   1 drop at 01/21/23 1016   furosemide (LASIX) injection 40 mg  40 mg Intravenous Once Croitoru, Mihai, MD       heparin ADULT infusion 100 units/mL (25000 units/247m)  1,650 Units/hr Intravenous Continuous LErenest Blank RPH 16.5 mL/hr at 01/21/23 1800 1,650 Units/hr at 01/21/23 1800   hydrALAZINE (APRESOLINE) injection 10 mg  10 mg Intravenous Q20 Min PRN BLorretta Harp MD       HYDROcodone-acetaminophen (NORCO/VICODIN) 5-325 MG per tablet 1 tablet  1 tablet Oral QID PElodia Florence, MD   1 tablet at 01/22/23 1011   hydrocortisone (ANUSOL-HC) 2.5 % rectal cream 1 Application  1 Application Rectal Daily PRN ZLequita Halt MD  insulin aspart (novoLOG) injection 0-15 Units  0-15 Units Subcutaneous TID WC Wynetta Fines T, MD       labetalol (NORMODYNE) injection  10 mg  10 mg Intravenous Q10 min PRN Lorretta Harp, MD       lisinopril (ZESTRIL) tablet 5 mg  5 mg Oral Daily Wynetta Fines T, MD   5 mg at 01/22/23 1011   loratadine (CLARITIN) tablet 10 mg  10 mg Oral Daily Wynetta Fines T, MD   10 mg at 01/22/23 1011   morphine (PF) 2 MG/ML injection 2 mg  2 mg Intravenous Q1H PRN Lorretta Harp, MD       nitroGLYCERIN (NITROSTAT) SL tablet 0.4 mg  0.4 mg Sublingual Q5 min PRN Fransico Meadow, MD   0.4 mg at 01/20/23 1200   ondansetron (ZOFRAN) injection 4 mg  4 mg Intravenous Q6H PRN Wynetta Fines T, MD       ondansetron [facility]) injection 4 mg  4 mg Intravenous Q6H PRN Lorretta Harp, MD       pantoprazole (PROTONIX) EC tablet 40 mg  40 mg Oral Daily Wynetta Fines T, MD   40 mg at 01/21/23 1015   sodium chloride flush (NS) 0.9 % injection 3 mL  3 mL Intravenous Q12H Wynetta Fines T, MD   3 mL at 01/20/23 2137   sodium chloride flush (NS) 0.9 % injection 3 mL  3 mL Intravenous PRN Wynetta Fines T, MD       sodium chloride flush (NS) 0.9 % injection 3 mL  3 mL Intravenous Q12H Almyra Deforest, PA       sodium chloride flush (NS) 0.9 % injection 3 mL  3 mL Intravenous Q12H Lorretta Harp, MD       sodium chloride flush (NS) 0.9 % injection 3 mL  3 mL Intravenous PRN Lorretta Harp, MD        Medications Prior to Admission  Medication Sig Dispense Refill Last Dose   ALPRAZolam (XANAX) 0.5 MG tablet Take 0.5 mg by mouth 2 (two) times daily.   Past Week   aspirin 81 MG tablet Take 81 mg by mouth daily.   Past Week   Aspirin-Acetaminophen (GOODYS BODY PAIN PO) Take 1 Package by mouth daily as needed (pain/out of aspirin).   Past Week   atorvastatin (LIPITOR) 20 MG tablet Take 20 mg by mouth daily.   Past Week   citalopram (CELEXA) 40 MG tablet Take 20 mg by mouth daily.   Past Week   esomeprazole (NEXIUM) 40 MG capsule Take 40 mg by mouth daily.   Past Week   HYDROcodone-acetaminophen (NORCO/VICODIN) 5-325 MG tablet Take 1 tablet by mouth 4 (four) times daily.    Past Week   hydrocortisone (ANUSOL-HC) 2.5 % rectal cream Place 1 application  rectally daily as needed for hemorrhoids or anal itching.   Unk   metFORMIN (GLUCOPHAGE-XR) 500 MG 24 hr tablet Take 500 mg by mouth daily.   Past Week   Multiple Vitamins-Minerals (MULTIVITAMIN WITH MINERALS) tablet Take 1 tablet by mouth daily.   Past Week   ondansetron (ZOFRAN) 8 MG tablet Take 8 mg by mouth once a week.   Unk   sildenafil (VIAGRA) 100 MG tablet Take 100 mg by mouth daily as needed for erectile dysfunction.   Past Week   allopurinol (ZYLOPRIM) 300 MG tablet Take 300 mg by mouth daily. (Patient not taking: Reported on 01/21/2023)   Not Taking   cycloSPORINE (RESTASIS) 0.05 %  ophthalmic emulsion Place 1 drop into both eyes daily. (Patient not taking: Reported on 01/21/2023)   Not Taking   levocetirizine (XYZAL) 5 MG tablet Take 5 mg by mouth daily. (Patient not taking: Reported on 01/21/2023)   Not Taking    Family History  Problem Relation Age of Onset   Allergies Mother    Anxiety disorder Mother    CAD Father    Heart failure Father      Review of Systems:   ROS    Cardiac Review of Systems: Y or  [    ]= no  Chest Pain [ Y   ]  Resting SOB [ Y  ] Exertional SOB  [ Y ]  Orthopnea [  ]   Pedal Edema [   ]    Palpitations [ Y, occasionally ] Syncope  [ N ]   Presyncope [   ]  General Review of Systems: [Y] = yes [  ]=no Constitional: recent weight change [ Y, weight loss ]; anorexia [  ]; fatigue Aqua.Slicker  ]; nausea [ N ]; night sweats [  ]; fever [  ]; or chills [  ]                                                               Dental: Last Dentist visit: 6-12 months ago  Eye : blurred vision [  ]; diplopia [   ]; vision changes [  ];  Amaurosis fugax[  ]; Resp: cough Aqua.Slicker  ];  wheezing[  ];  hemoptysis[  ]; shortness of breath[Y  ]; paroxysmal nocturnal dyspnea[  ]; dyspnea on exertion[Y  ]; or orthopnea[  ];  GI:  gallstones[  ], vomiting[N  ];  dysphagia[  ]; melena[  ];  hematochezia [  ];  heartburn[  ];   Hx of  Colonoscopy[  ]; GU: kidney stones [  ]; hematuria[  ];   dysuria [  ];  nocturia[  ];  history of     obstruction [  ]; urinary frequency [  ]             Skin: rash, swelling[N];, hair loss[  ];  peripheral edema[Y  ];  or itching[  ]; Musculosketetal: myalgias[  ];  joint swelling[  ];  joint erythema[  ];  joint pain[  ];  back pain[  ];  Heme/Lymph: bruising[  ];  bleeding[  ];  anemia[  ];  Neuro: TIA[  ];  headaches[  ];  stroke[ Y ];  vertigo[  ];  seizures[  ];   paresthesias[  ];  difficulty walking[  ];  Psych:depression[ Y ]; anxiety[ Y ];  Endocrine: diabetes[Y  ];  thyroid dysfunction[  ];            Physical Exam: BP 107/76 (BP Location: Left Arm)   Pulse 69   Temp 97.7 F (36.5 C) (Oral)   Resp (!) 21   Ht 6' (1.829 m)   Wt 79.9 kg   SpO2 100%   BMI 23.88 kg/m   General appearance: alert, cooperative, and no distress Head: Normocephalic, without obvious abnormality, atraumatic Neck: no adenopathy, no carotid bruit, no JVD, supple, symmetrical, trachea midline, and thyroid not enlarged, symmetric, no tenderness/mass/nodules Resp: clear to  auscultation bilaterally Cardio: regular rate and rhythm and systolic murmur: GI: soft, non-tender; bowel sounds normal; no masses,  no organomegaly Extremities: extremities normal, atraumatic, no cyanosis or edema Neurologic: Grossly normal  Diagnostic Studies & Laboratory data:     Recent Radiology Findings:   CARDIAC CATHETERIZATION  Result Date: 01/22/2023 Images from the original result were not included.   Ost RCA lesion is 100% stenosed.   Prox LAD to Mid LAD lesion is 90% stenosed. Raymond Roberson is a 67 y.o. male  [id number] LOCATION:  FACILITY: [facility] PHYSICIAN: Quay Burow, M.D. [birth year] DATE OF PROCEDURE:  01/22/2023 DATE OF DISCHARGE: CARDIAC CATHETERIZATION / RHC History obtained from chart review.67 y.o. male with PMH of HTN, DM II, prior TIA, prior EtOH abuse and tobacco abuse who  presented to the ED with chest pain and dyspnea.  He was ruled in for NSTEMI his troponins were mildly elevated.  His echo revealed moderate LV dysfunction with an EF in the 35% range with an inferior wall motion abnormality out of proportion to the global hypokinesia.  He also has severe aortic stenosis and a bicuspid aortic valve.  He presents now for right left heart cath to define his anatomy and physiology. HEMODYNAMICS:  1: Right atrial pressure-9/8 2: Right ventricular pressure-40/0 3: Pulmonary artery pressure-38/10, mean 24 4: Pulmonary wedge pressure-A-wave 26, V wave 26, mean 17 5: Cardiac output-5.15 L/min with an index of 2.53 L/min/m   Mr. Glazebrook has severe aortic stenosis (bicuspid aortic valve) with moderately severe LV dysfunction and two-vessel disease.  He has a large dominant RCA that is occluded at the ostium with grade 3 left-to-right collaterals and a high-grade calcified proximal LAD stenosis.  The case has been reviewed with Dr. Sallyanne Kuster, the patient's attending cardiologist, and Dr. Burt Knack, structural interventionalists who feel best option is CABG/SAVR.  The radial sheath was removed and a TR band was placed on the right wrist to achieve patent hemostasis.  Patient left lab in stable condition.  Patient will return to his room and TCTS will be obtained. Quay Burow. MD, [facility] 01/22/2023 8:39 AM    ECHOCARDIOGRAM COMPLETE  Result Date: 01/20/2023    ECHOCARDIOGRAM REPORT   Patient Name:   Raymond Roberson Date of Exam: 01/20/2023 Medical Rec #:  [id number]        Height:       72.0 in Accession #:    [id number]       Weight:       184.7 lb Date of Birth:  [birth year]       BSA:          2.060 m Patient Age:    20 years         BP:           113/63 mmHg Patient Gender: M                HR:           80 bpm. Exam Location:  Inpatient Procedure: 2D Echo, Cardiac Doppler, Color Doppler and Intracardiac            Opacification Agent Indications:    CHF-Acute Systolic S06.30  History:         Patient has prior history of Echocardiogram examinations, most                 recent 07/09/2013. Stroke; Risk Factors:Hypertension, Diabetes                 and  Sleep Apnea. History of bicuspid aortic valve.  Sonographer:    Darlina Sicilian RDCS Referring Phys: [id number] [place]  1. Calcified aortic valve with inderminant number of cusps; probable severe AS with mean gradient as high as 37 mmHg; mild AI.  2. Left ventricular ejection fraction, by estimation, is 30 to 35%. The left ventricle has moderate to severely decreased function. The left ventricle demonstrates global hypokinesis. The left ventricular internal cavity size was moderately dilated. Left ventricular diastolic parameters are consistent with Grade III diastolic dysfunction (restrictive).  3. Right ventricular systolic function is mildly reduced. The right ventricular size is normal.  4. The mitral valve is degenerative. Mild mitral valve regurgitation. No evidence of mitral stenosis.  5. The aortic valve is calcified. Aortic valve regurgitation is mild. Severe aortic valve stenosis. Aortic regurgitation PHT measures 220 msec. Aortic valve area, by VTI measures 1.43 cm. Aortic valve mean gradient measures 33.7 mmHg. Aortic valve Vmax  measures 3.63 m/s.  6. The inferior vena cava is dilated in size with >50% respiratory variability, suggesting right atrial pressure of 8 mmHg. FINDINGS  Left Ventricle: Left ventricular ejection fraction, by estimation, is 30 to 35%. The left ventricle has moderate to severely decreased function. The left ventricle demonstrates global hypokinesis. Definity contrast agent was given IV to delineate the left ventricular endocardial borders. The left ventricular internal cavity size was moderately dilated. There is no left ventricular hypertrophy. Left ventricular diastolic parameters are consistent with Grade III diastolic dysfunction (restrictive). Right Ventricle: The right ventricular size is normal.  Right ventricular systolic function is mildly reduced. Left Atrium: Left atrial size was normal in size. Right Atrium: Right atrial size was normal in size. Pericardium: There is no evidence of pericardial effusion. Mitral Valve: The mitral valve is degenerative in appearance. Mild mitral annular calcification. Mild mitral valve regurgitation. No evidence of mitral valve stenosis. Tricuspid Valve: The tricuspid valve is grossly normal. Tricuspid valve regurgitation is trivial. No evidence of tricuspid stenosis. Aortic Valve: The aortic valve is calcified. Aortic valve regurgitation is mild. Aortic regurgitation PHT measures 220 msec. Severe aortic stenosis is present. Aortic valve mean gradient measures 33.7 mmHg. Aortic valve peak gradient measures 52.8 mmHg. Aortic valve area, by VTI measures 1.43 cm. Pulmonic Valve: The pulmonic valve was grossly normal. Pulmonic valve regurgitation is trivial. No evidence of pulmonic stenosis. Aorta: The aortic root and ascending aorta are structurally normal, with no evidence of dilitation. Venous: The inferior vena cava is dilated in size with greater than 50% respiratory variability, suggesting right atrial pressure of 8 mmHg. IAS/Shunts: No atrial level shunt detected by color flow Doppler. Additional Comments: Calcified aortic valve with inderminant number of cusps; probable severe AS with mean gradient as high as 37 mmHg; mild AI.  LEFT VENTRICLE PLAX 2D LVIDd:         6.20 cm      Diastology LVIDs:         5.00 cm      LV e' medial:    4.60 cm/s LV PW:         0.90 cm      LV E/e' medial:  28.4 LV IVS:        0.90 cm      LV e' lateral:   5.36 cm/s LVOT diam:     2.50 cm      LV E/e' lateral: 24.3 LV SV:         104 LV SV Index:   51 LVOT  Area:     4.91 cm                              3D Volume EF: LV Volumes (MOD)            3D EF:        29 % LV vol d, MOD A2C: 271.0 ml LV EDV:       241 ml LV vol d, MOD A4C: 217.0 ml LV ESV:       172 ml LV vol s, MOD A2C: 184.0 ml  LV SV:        69 ml LV vol s, MOD A4C: 137.0 ml LV SV MOD A2C:     87.0 ml LV SV MOD A4C:     217.0 ml LV SV MOD BP:      92.8 ml RIGHT VENTRICLE RV S prime:     12.20 cm/s TAPSE (M-mode): 1.9 cm LEFT ATRIUM             Index        RIGHT ATRIUM           Index LA Vol (A2C):   60.7 ml 29.47 ml/m  RA Area:     18.80 cm LA Vol (A4C):   65.5 ml 31.80 ml/m  RA Volume:   52.50 ml  25.49 ml/m LA Biplane Vol: 63.4 ml 30.78 ml/m  AORTIC VALVE AV Area (Vmax):    1.28 cm AV Area (Vmean):   1.21 cm AV Area (VTI):     1.43 cm AV Vmax:           363.33 cm/s AV Vmean:          275.000 cm/s AV VTI:            0.726 m AV Peak Grad:      52.8 mmHg AV Mean Grad:      33.7 mmHg LVOT Vmax:         94.70 cm/s LVOT Vmean:        68.000 cm/s LVOT VTI:          0.212 m LVOT/AV VTI ratio: 0.29 AI PHT:            220 msec  AORTA Ao Root diam: 3.10 cm Ao Asc diam:  3.40 cm MITRAL VALVE MV Area (PHT): 6.83 cm     SHUNTS MV Decel Time: 111 msec     Systemic VTI:  0.21 m MR Peak grad: 71.9 mmHg     Systemic Diam: 2.50 cm MR Mean grad: 41.0 mmHg MR Vmax:      424.00 cm/s MR Vmean:     289.0 cm/s MV E velocity: 130.50 cm/s MV A velocity: 45.60 cm/s MV E/A ratio:  2.86 Kirk Ruths MD Electronically signed by Kirk Ruths MD Signature Date/Time: 01/20/2023/3:42:41 PM    Final      I have independently reviewed the above radiologic studies and discussed with the patient   Recent Lab Findings: Lab Results  Component Value Date   WBC 9.1 01/22/2023   HGB 12.1 (L) 01/22/2023   HCT 37.8 (L) 01/22/2023   PLT 303 01/22/2023   GLUCOSE 112 (H) 01/22/2023   ALT 20 01/19/2023   AST 34 01/19/2023   NA 133 (L) 01/22/2023   K 4.1 01/22/2023   CL 99 01/22/2023   CREATININE 0.98 01/22/2023   BUN 11 01/22/2023   CO2 25 01/22/2023   TSH  1.447 01/19/2023   HGBA1C 5.4 01/19/2023    Assessment / Plan:      Aortic Stenosis, severe in setting of Bicuspid Valve CAD- 2 vessel CAD, presented with NSTEMI DM HTN HLD H/O CVA, no  residual deficits Poor Dentition- recent cavity filling in last 6-12 months, would get dental imaging prior to proceeding Dispo- patient stable, mild chest pain at times... will need CABG/AVR.Marland Kitchen will need dental imaging.Marland Kitchen if this is clear of abscess possibly for surgery tomorrow... Dr. Cyndia Bent will evaluate patient and follow up with further recommendations  I  spent 55 minutes counseling the patient face to face.  Ellwood Handler, PA-C 01/22/2023 12:04 PM    Chart reviewed, patient examined, agree with above. He reports a several year history of shortness of breath and chest tightness that has recently progressed to the point that he can't do much activity without giving out. He presented with a hs Trop I of 1436 and a BNP of 898. Echo shows an EF of 30-35% with a severely calcified aortic valve with severe AS. The LV is moderately dilated at 6.2 cm with global hypokinesis and grade 3 DD. Prior echo in 2014 showed an EF of 55-60%. Cath shows occlusion of the ostial RCA with filling of the distal vessel by collat from the LAD which has 90% mid vessel stenosis. Right heart pressures are fairly normal with a PCW pressure of 17 mean. I agree with need for AVR and CABG. He reports a history of 60 lb weight loss over the past 6 months but says he changed his diet and was trying to lose weight to get his DM under better control. He had colonoscopy in July 2023 with removal of several polyps that were benign by path. I discussed the operative procedure with the patient and family including alternatives, benefits and risks; including but not limited to bleeding, blood transfusion, infection, stroke, myocardial infarction, graft failure, heart block requiring a permanent pacemaker, organ dysfunction, and death.  Langley Adie understands and agrees to proceed.  We will schedule surgery for tomorrow.

## 2023-01-22 NOTE — Progress Notes (Signed)
   Heart Failure Stewardship Pharmacist Progress Note   PCP: Caryl Bis, MD PCP-Cardiologist: None    HPI:  67 yo M with PMH of diabetes, HTN, CVA, alcohol use and chewing tobacco use.   He presented to the ED on 1/26 with chest pain. Went to [facility] the day prior but left AMA. Chest pain continued, which prompted him to come to [facility] ED. Reports 3-4 days of shortness of breath, worsening since leaving [facility] CXR with cardiomegaly. ECHO 1/27 with EF 30-35%, global hypokinesis, G2DD, RV mildly reduced, mild MR, severe AS, and mild AR. Taken for [facility] on 1/29 and found to have severe 2 vessel CAD, RA 4, PA 24, wedge 17, CO 5, CI 2.5. Pending TCTS consult for AVR/CABG.   Current HF Medications: Diuretic: furosemide 40 mg IV x 1 Beta Blocker: carvedilol 3.125 mg BID ACE/ARB/ARNI: lisinopril 5 mg daily  Prior to admission HF Medications: None  Pertinent Lab Values: Serum creatinine 0.98, BUN 11, Potassium 4.1, Sodium 133, BNP 897.6, Magnesium 2.4, A1c 5.4   Vital Signs: Weight: 176 lbs (admission weight: 184 lbs) Blood pressure: 110/70s  Heart rate: 60-70s  I/O: -0.1L yesterday; net -2.7L  Medication Assistance / Insurance Benefits Check: Does the patient have prescription insurance?  Yes Type of insurance plan: [employer]  Outpatient Pharmacy:  Prior to admission outpatient pharmacy: [facility] Is the patient willing to use [place] [facility] pharmacy at discharge? Yes Is the patient willing to transition their outpatient pharmacy to utilize a [facility] outpatient pharmacy?   Pending    Assessment: 1. 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 NYHA class II symptoms. - Agree with furosemide 40 mg IV x 1. Strict I/Os and daily weights. Keep K>4 and Mg>2. - Continue carvedilol 3.125 mg BID - Continue lisinopril 5 mg daily. Hope to be able to optimize to [facility] before discharge. Wait for surgery evaluation and  trend BP. - Consider adding MRA and SGLT2i prior to discharge   Plan: 1) Medication changes recommended at this time: - Agree with IV lasix today - Follow up surgery evaluation   2) Patient assistance: Delene Loll copay $45 - Jardiance copay $45 Wilder Glade copay $95 (generic not covered on insurance)  3)  Education  - To be completed prior to discharge  Kerby Nora, PharmD, BCPS Heart Failure Stewardship Pharmacist Phone [phone]

## 2023-01-22 NOTE — Interval H&P Note (Signed)
Cath Lab Visit (complete for each Cath Lab visit)  Clinical Evaluation Leading to the Procedure:   ACS: Yes.    Non-ACS:    Anginal Classification: CCS II  Anti-ischemic medical therapy: No Therapy  Non-Invasive Test Results: No non-invasive testing performed  Prior CABG: No previous CABG      History and Physical Interval Note:  01/22/2023 7:41 AM  Raymond Roberson  has presented today for surgery, with the diagnosis of NSTEMI.  The various methods of treatment have been discussed with the patient and family. After consideration of risks, benefits and other options for treatment, the patient has consented to  Procedure(s): RIGHT/LEFT HEART CATH AND CORONARY ANGIOGRAPHY (N/A) as a surgical intervention.  The patient's history has been reviewed, patient examined, no change in status, stable for surgery.  I have reviewed the patient's chart and labs.  Questions were answered to the patient's satisfaction.     Quay Burow

## 2023-01-22 NOTE — Consult Note (Addendum)
[address]       [place] [address]             [phone]        Jivan A [place] Medical Record #[id number] Date of Birth: 1956-04-25  Referring: Dr. Daiva Nakayama Primary Care: Caryl Bis, MD Primary Cardiologist:None  Chief Complaint:    Chief Complaint  Patient presents with   Chest Pain    History of Present Illness:      Raymond Roberson is a 67 yo male with history of CVA, HTN, HLD,  Gout, GERD, Type 2 DM, Anxiety/Depression, Chronic Pain, Chewing Tobacco, and Hisotry of Alcohol abuse.  The patient presented to an OSH ED in [place] with complaints of chest pain and shortness of breath.  Workup revealed elevated Troponin level.  Admission was recommended but patient signed out AMA.  He presented to [facility] ED on 01/19/2023 with a 3-4 day history of shortness of breath and chest pain.  The symptoms were mostly described as being exertional.  The patient is disabled due to his back.  He also states he needs both knees replaced.  The patient has had some weight loss of about 40 lbs in the past 6 months.  The patient has his own teeth.  He was last evaluated by a dentist 6-12 months ago and had a filling.  He states he no longer drinks regularly, but he may occasionally have a beer.  He uses chewing tobacco but has never smoked.  He is able to walk to his mailbox but does get short of breath.  He is unable to walk up stairs due to his knees.  He has to take a break to get his house work done.  He has a family history of CAD with his father having died in his 40s.  He is currently experiencing occasional chest pain.   He understands he needs surgery as there are not other options to fix his problems.  Current Activity/ Functional Status: Patient is independent with mobility/ambulation, transfers, ADL's, IADL's.   Zubrod Score: At the time of surgery this patient's most appropriate activity status/level should be described as: '[]'$     0    Normal  activity, no symptoms '[x]'$     1    Restricted in physical strenuous activity but ambulatory, able to do out light work '[]'$     2    Ambulatory and capable of self care, unable to do work activities, up and about                 more than 50%  Of the time                            '[]'$     3    Only limited self care, in bed greater than 50% of waking hours '[]'$     4    Completely disabled, no self care, confined to bed or chair '[]'$     5    Moribund  Past Medical History:  Diagnosis Date   Acute angina ([place])    Bicuspid aortic valve    Chronic alcohol abuse    Classic migraine 09/02/2013   CVA (cerebral vascular accident) ([place])    Diabetes mellitus without complication ([place])    Dizziness and giddiness 09/02/2013   GERD (gastroesophageal reflux disease)    Gouty arthritis    Hypertension    Major  depression    Other and unspecified hyperlipidemia    Peptic ulcer disease    Sleep apnea     Past Surgical History:  Procedure Laterality Date   BIOPSY  07/21/2022   Procedure: BIOPSY;  Surgeon: Harvel Quale, MD;  [facility]: [facility];  Service: Gastroenterology;;   CIRCUMCISION     COLONOSCOPY WITH PROPOFOL N/A 07/21/2022   Procedure: COLONOSCOPY WITH PROPOFOL;  Surgeon: Harvel Quale, MD;  Location: [facility];  Service: Gastroenterology;  Laterality: N/A;  11:15 ASA 2   CYST REMOVAL TRUNK  11/24/2012   back   INGUINAL HERNIA REPAIR Bilateral    POLYPECTOMY  07/21/2022   Procedure: POLYPECTOMY;  Surgeon: Montez Morita, Quillian Quince, MD;  Location: [facility];  Service: Gastroenterology;;   RHINOPLASTY      Social History   Tobacco Use  Smoking Status Never  Smokeless Tobacco Current   Types: Snuff    Social History   Substance and Sexual Activity  Alcohol Use Not Currently     Allergies  Allergen Reactions   Naproxen Nausea Only   Sulfa Antibiotics Nausea Only    Current Facility-Administered Medications  Medication Dose Route Frequency  Provider Last Rate Last Admin   0.9 %  sodium chloride infusion  250 mL Intravenous PRN Wynetta Fines T, MD       0.9 %  sodium chloride infusion   Intravenous Continuous Lorretta Harp, MD       0.9 %  sodium chloride infusion  250 mL Intravenous PRN Lorretta Harp, MD       acetaminophen (TYLENOL) tablet 650 mg  650 mg Oral Q4H PRN Wynetta Fines T, MD       acetaminophen (TYLENOL) tablet 650 mg  650 mg Oral Q4H PRN Lorretta Harp, MD       allopurinol (ZYLOPRIM) tablet 300 mg  300 mg Oral Daily Wynetta Fines T, MD   300 mg at 01/21/23 1015   ALPRAZolam Duanne Moron) tablet 0.5 mg  0.5 mg Oral BID Wynetta Fines T, MD   0.5 mg at 01/22/23 1011   aspirin EC tablet 81 mg  81 mg Oral Daily Wynetta Fines T, MD   81 mg at 01/21/23 1015   atorvastatin (LIPITOR) tablet 80 mg  80 mg Oral Daily Lorretta Harp, MD   80 mg at 01/22/23 1011   carvedilol (COREG) tablet 3.125 mg  3.125 mg Oral BID WC Wynetta Fines T, MD   3.125 mg at 01/22/23 1010   citalopram (CELEXA) tablet 20 mg  20 mg Oral Daily Wynetta Fines T, MD   20 mg at 01/22/23 1011   cycloSPORINE (RESTASIS) 0.05 % ophthalmic emulsion 1 drop  1 drop Both Eyes Daily Wynetta Fines T, MD   1 drop at 01/21/23 1016   furosemide (LASIX) injection 40 mg  40 mg Intravenous Once Croitoru, Mihai, MD       heparin ADULT infusion 100 units/mL (25000 units/261m)  1,650 Units/hr Intravenous Continuous LErenest Blank RPH 16.5 mL/hr at 01/21/23 1800 1,650 Units/hr at 01/21/23 1800   hydrALAZINE (APRESOLINE) injection 10 mg  10 mg Intravenous Q20 Min PRN BLorretta Harp MD       HYDROcodone-acetaminophen (NORCO/VICODIN) 5-325 MG per tablet 1 tablet  1 tablet Oral QID PElodia Florence, MD   1 tablet at 01/22/23 1011   hydrocortisone (ANUSOL-HC) 2.5 % rectal cream 1 Application  1 Application Rectal Daily PRN ZLequita Halt MD  insulin aspart (novoLOG) injection 0-15 Units  0-15 Units Subcutaneous TID WC Wynetta Fines T, MD       labetalol (NORMODYNE) injection  10 mg  10 mg Intravenous Q10 min PRN Lorretta Harp, MD       lisinopril (ZESTRIL) tablet 5 mg  5 mg Oral Daily Wynetta Fines T, MD   5 mg at 01/22/23 1011   loratadine (CLARITIN) tablet 10 mg  10 mg Oral Daily Wynetta Fines T, MD   10 mg at 01/22/23 1011   morphine (PF) 2 MG/ML injection 2 mg  2 mg Intravenous Q1H PRN Lorretta Harp, MD       nitroGLYCERIN (NITROSTAT) SL tablet 0.4 mg  0.4 mg Sublingual Q5 min PRN Fransico Meadow, MD   0.4 mg at 01/20/23 1200   ondansetron (ZOFRAN) injection 4 mg  4 mg Intravenous Q6H PRN Wynetta Fines T, MD       ondansetron [facility]) injection 4 mg  4 mg Intravenous Q6H PRN Lorretta Harp, MD       pantoprazole (PROTONIX) EC tablet 40 mg  40 mg Oral Daily Wynetta Fines T, MD   40 mg at 01/21/23 1015   sodium chloride flush (NS) 0.9 % injection 3 mL  3 mL Intravenous Q12H Wynetta Fines T, MD   3 mL at 01/20/23 2137   sodium chloride flush (NS) 0.9 % injection 3 mL  3 mL Intravenous PRN Wynetta Fines T, MD       sodium chloride flush (NS) 0.9 % injection 3 mL  3 mL Intravenous Q12H Almyra Deforest, PA       sodium chloride flush (NS) 0.9 % injection 3 mL  3 mL Intravenous Q12H Lorretta Harp, MD       sodium chloride flush (NS) 0.9 % injection 3 mL  3 mL Intravenous PRN Lorretta Harp, MD        Medications Prior to Admission  Medication Sig Dispense Refill Last Dose   ALPRAZolam (XANAX) 0.5 MG tablet Take 0.5 mg by mouth 2 (two) times daily.   Past Week   aspirin 81 MG tablet Take 81 mg by mouth daily.   Past Week   Aspirin-Acetaminophen (GOODYS BODY PAIN PO) Take 1 Package by mouth daily as needed (pain/out of aspirin).   Past Week   atorvastatin (LIPITOR) 20 MG tablet Take 20 mg by mouth daily.   Past Week   citalopram (CELEXA) 40 MG tablet Take 20 mg by mouth daily.   Past Week   esomeprazole (NEXIUM) 40 MG capsule Take 40 mg by mouth daily.   Past Week   HYDROcodone-acetaminophen (NORCO/VICODIN) 5-325 MG tablet Take 1 tablet by mouth 4 (four) times daily.    Past Week   hydrocortisone (ANUSOL-HC) 2.5 % rectal cream Place 1 application  rectally daily as needed for hemorrhoids or anal itching.   Unk   metFORMIN (GLUCOPHAGE-XR) 500 MG 24 hr tablet Take 500 mg by mouth daily.   Past Week   Multiple Vitamins-Minerals (MULTIVITAMIN WITH MINERALS) tablet Take 1 tablet by mouth daily.   Past Week   ondansetron (ZOFRAN) 8 MG tablet Take 8 mg by mouth once a week.   Unk   sildenafil (VIAGRA) 100 MG tablet Take 100 mg by mouth daily as needed for erectile dysfunction.   Past Week   allopurinol (ZYLOPRIM) 300 MG tablet Take 300 mg by mouth daily. (Patient not taking: Reported on 01/21/2023)   Not Taking   cycloSPORINE (RESTASIS) 0.05 %  ophthalmic emulsion Place 1 drop into both eyes daily. (Patient not taking: Reported on 01/21/2023)   Not Taking   levocetirizine (XYZAL) 5 MG tablet Take 5 mg by mouth daily. (Patient not taking: Reported on 01/21/2023)   Not Taking    Family History  Problem Relation Age of Onset   Allergies Mother    Anxiety disorder Mother    CAD Father    Heart failure Father      Review of Systems:   ROS    Cardiac Review of Systems: Y or  [    ]= no  Chest Pain [ Y   ]  Resting SOB [ Y  ] Exertional SOB  [ Y ]  Orthopnea [  ]   Pedal Edema [   ]    Palpitations [ Y, occasionally ] Syncope  [ N ]   Presyncope [   ]  General Review of Systems: [Y] = yes [  ]=no Constitional: recent weight change [ Y, weight loss ]; anorexia [  ]; fatigue Aqua.Slicker  ]; nausea [ N ]; night sweats [  ]; fever [  ]; or chills [  ]                                                               Dental: Last Dentist visit: 6-12 months ago  Eye : blurred vision [  ]; diplopia [   ]; vision changes [  ];  Amaurosis fugax[  ]; Resp: cough Aqua.Slicker  ];  wheezing[  ];  hemoptysis[  ]; shortness of breath[Y  ]; paroxysmal nocturnal dyspnea[  ]; dyspnea on exertion[Y  ]; or orthopnea[  ];  GI:  gallstones[  ], vomiting[N  ];  dysphagia[  ]; melena[  ];  hematochezia [  ];  heartburn[  ];   Hx of  Colonoscopy[  ]; GU: kidney stones [  ]; hematuria[  ];   dysuria [  ];  nocturia[  ];  history of     obstruction [  ]; urinary frequency [  ]             Skin: rash, swelling[N];, hair loss[  ];  peripheral edema[Y  ];  or itching[  ]; Musculosketetal: myalgias[  ];  joint swelling[  ];  joint erythema[  ];  joint pain[  ];  back pain[  ];  Heme/Lymph: bruising[  ];  bleeding[  ];  anemia[  ];  Neuro: TIA[  ];  headaches[  ];  stroke[ Y ];  vertigo[  ];  seizures[  ];   paresthesias[  ];  difficulty walking[  ];  Psych:depression[ Y ]; anxiety[ Y ];  Endocrine: diabetes[Y  ];  thyroid dysfunction[  ];            Physical Exam: BP 107/76 (BP Location: Left Arm)   Pulse 69   Temp 97.7 F (36.5 C) (Oral)   Resp (!) 21   Ht 6' (1.829 m)   Wt 79.9 kg   SpO2 100%   BMI 23.88 kg/m   General appearance: alert, cooperative, and no distress Head: Normocephalic, without obvious abnormality, atraumatic Neck: no adenopathy, no carotid bruit, no JVD, supple, symmetrical, trachea midline, and thyroid not enlarged, symmetric, no tenderness/mass/nodules Resp: clear to  auscultation bilaterally Cardio: regular rate and rhythm and systolic murmur: GI: soft, non-tender; bowel sounds normal; no masses,  no organomegaly Extremities: extremities normal, atraumatic, no cyanosis or edema Neurologic: Grossly normal  Diagnostic Studies & Laboratory data:     Recent Radiology Findings:   CARDIAC CATHETERIZATION  Result Date: 01/22/2023 Images from the original result were not included.   Ost RCA lesion is 100% stenosed.   Prox LAD to Mid LAD lesion is 90% stenosed. RAMAR NOBREGA is a 67 y.o. male  [id number] LOCATION:  FACILITY: [facility] PHYSICIAN: Quay Burow, M.D. 03/12/1956 DATE OF PROCEDURE:  01/22/2023 DATE OF DISCHARGE: CARDIAC CATHETERIZATION / RHC History obtained from chart review.67 y.o. male with PMH of HTN, DM II, prior TIA, prior EtOH abuse and tobacco abuse who  presented to the ED with chest pain and dyspnea.  He was ruled in for NSTEMI his troponins were mildly elevated.  His echo revealed moderate LV dysfunction with an EF in the 35% range with an inferior wall motion abnormality out of proportion to the global hypokinesia.  He also has severe aortic stenosis and a bicuspid aortic valve.  He presents now for right left heart cath to define his anatomy and physiology. HEMODYNAMICS:  1: Right atrial pressure-9/8 2: Right ventricular pressure-40/0 3: Pulmonary artery pressure-38/10, mean 24 4: Pulmonary wedge pressure-A-wave 26, V wave 26, mean 17 5: Cardiac output-5.15 L/min with an index of 2.53 L/min/m   Mr. Gubbels has severe aortic stenosis (bicuspid aortic valve) with moderately severe LV dysfunction and two-vessel disease.  He has a large dominant RCA that is occluded at the ostium with grade 3 left-to-right collaterals and a high-grade calcified proximal LAD stenosis.  The case has been reviewed with Dr. Sallyanne Kuster, the patient's attending cardiologist, and Dr. Burt Knack, structural interventionalists who feel best option is CABG/SAVR.  The radial sheath was removed and a TR band was placed on the right wrist to achieve patent hemostasis.  Patient left lab in stable condition.  Patient will return to his room and TCTS will be obtained. Quay Burow. MD, [facility] 01/22/2023 8:39 AM    ECHOCARDIOGRAM COMPLETE  Result Date: 01/20/2023    ECHOCARDIOGRAM REPORT   Patient Name:   YANNI QUIROA Date of Exam: 01/20/2023 Medical Rec #:  [id number]        Height:       72.0 in Accession #:    [id number]       Weight:       184.7 lb Date of Birth:  12/24/1956       BSA:          2.060 m Patient Age:    38 years         BP:           113/63 mmHg Patient Gender: M                HR:           80 bpm. Exam Location:  Inpatient Procedure: 2D Echo, Cardiac Doppler, Color Doppler and Intracardiac            Opacification Agent Indications:    CHF-Acute Systolic Y77.41  History:         Patient has prior history of Echocardiogram examinations, most                 recent 07/09/2013. Stroke; Risk Factors:Hypertension, Diabetes                 and  Sleep Apnea. History of bicuspid aortic valve.  Sonographer:    Darlina Sicilian RDCS Referring Phys: [id number] [place]  1. Calcified aortic valve with inderminant number of cusps; probable severe AS with mean gradient as high as 37 mmHg; mild AI.  2. Left ventricular ejection fraction, by estimation, is 30 to 35%. The left ventricle has moderate to severely decreased function. The left ventricle demonstrates global hypokinesis. The left ventricular internal cavity size was moderately dilated. Left ventricular diastolic parameters are consistent with Grade III diastolic dysfunction (restrictive).  3. Right ventricular systolic function is mildly reduced. The right ventricular size is normal.  4. The mitral valve is degenerative. Mild mitral valve regurgitation. No evidence of mitral stenosis.  5. The aortic valve is calcified. Aortic valve regurgitation is mild. Severe aortic valve stenosis. Aortic regurgitation PHT measures 220 msec. Aortic valve area, by VTI measures 1.43 cm. Aortic valve mean gradient measures 33.7 mmHg. Aortic valve Vmax  measures 3.63 m/s.  6. The inferior vena cava is dilated in size with >50% respiratory variability, suggesting right atrial pressure of 8 mmHg. FINDINGS  Left Ventricle: Left ventricular ejection fraction, by estimation, is 30 to 35%. The left ventricle has moderate to severely decreased function. The left ventricle demonstrates global hypokinesis. Definity contrast agent was given IV to delineate the left ventricular endocardial borders. The left ventricular internal cavity size was moderately dilated. There is no left ventricular hypertrophy. Left ventricular diastolic parameters are consistent with Grade III diastolic dysfunction (restrictive). Right Ventricle: The right ventricular size is normal.  Right ventricular systolic function is mildly reduced. Left Atrium: Left atrial size was normal in size. Right Atrium: Right atrial size was normal in size. Pericardium: There is no evidence of pericardial effusion. Mitral Valve: The mitral valve is degenerative in appearance. Mild mitral annular calcification. Mild mitral valve regurgitation. No evidence of mitral valve stenosis. Tricuspid Valve: The tricuspid valve is grossly normal. Tricuspid valve regurgitation is trivial. No evidence of tricuspid stenosis. Aortic Valve: The aortic valve is calcified. Aortic valve regurgitation is mild. Aortic regurgitation PHT measures 220 msec. Severe aortic stenosis is present. Aortic valve mean gradient measures 33.7 mmHg. Aortic valve peak gradient measures 52.8 mmHg. Aortic valve area, by VTI measures 1.43 cm. Pulmonic Valve: The pulmonic valve was grossly normal. Pulmonic valve regurgitation is trivial. No evidence of pulmonic stenosis. Aorta: The aortic root and ascending aorta are structurally normal, with no evidence of dilitation. Venous: The inferior vena cava is dilated in size with greater than 50% respiratory variability, suggesting right atrial pressure of 8 mmHg. IAS/Shunts: No atrial level shunt detected by color flow Doppler. Additional Comments: Calcified aortic valve with inderminant number of cusps; probable severe AS with mean gradient as high as 37 mmHg; mild AI.  LEFT VENTRICLE PLAX 2D LVIDd:         6.20 cm      Diastology LVIDs:         5.00 cm      LV e' medial:    4.60 cm/s LV PW:         0.90 cm      LV E/e' medial:  28.4 LV IVS:        0.90 cm      LV e' lateral:   5.36 cm/s LVOT diam:     2.50 cm      LV E/e' lateral: 24.3 LV SV:         104 LV SV Index:   51 LVOT  Area:     4.91 cm                              3D Volume EF: LV Volumes (MOD)            3D EF:        29 % LV vol d, MOD A2C: 271.0 ml LV EDV:       241 ml LV vol d, MOD A4C: 217.0 ml LV ESV:       172 ml LV vol s, MOD A2C: 184.0 ml  LV SV:        69 ml LV vol s, MOD A4C: 137.0 ml LV SV MOD A2C:     87.0 ml LV SV MOD A4C:     217.0 ml LV SV MOD BP:      92.8 ml RIGHT VENTRICLE RV S prime:     12.20 cm/s TAPSE (M-mode): 1.9 cm LEFT ATRIUM             Index        RIGHT ATRIUM           Index LA Vol (A2C):   60.7 ml 29.47 ml/m  RA Area:     18.80 cm LA Vol (A4C):   65.5 ml 31.80 ml/m  RA Volume:   52.50 ml  25.49 ml/m LA Biplane Vol: 63.4 ml 30.78 ml/m  AORTIC VALVE AV Area (Vmax):    1.28 cm AV Area (Vmean):   1.21 cm AV Area (VTI):     1.43 cm AV Vmax:           363.33 cm/s AV Vmean:          275.000 cm/s AV VTI:            0.726 m AV Peak Grad:      52.8 mmHg AV Mean Grad:      33.7 mmHg LVOT Vmax:         94.70 cm/s LVOT Vmean:        68.000 cm/s LVOT VTI:          0.212 m LVOT/AV VTI ratio: 0.29 AI PHT:            220 msec  AORTA Ao Root diam: 3.10 cm Ao Asc diam:  3.40 cm MITRAL VALVE MV Area (PHT): 6.83 cm     SHUNTS MV Decel Time: 111 msec     Systemic VTI:  0.21 m MR Peak grad: 71.9 mmHg     Systemic Diam: 2.50 cm MR Mean grad: 41.0 mmHg MR Vmax:      424.00 cm/s MR Vmean:     289.0 cm/s MV E velocity: 130.50 cm/s MV A velocity: 45.60 cm/s MV E/A ratio:  2.86 Kirk Ruths MD Electronically signed by Kirk Ruths MD Signature Date/Time: 01/20/2023/3:42:41 PM    Final      I have independently reviewed the above radiologic studies and discussed with the patient   Recent Lab Findings: Lab Results  Component Value Date   WBC 9.1 01/22/2023   HGB 12.1 (L) 01/22/2023   HCT 37.8 (L) 01/22/2023   PLT 303 01/22/2023   GLUCOSE 112 (H) 01/22/2023   ALT 20 01/19/2023   AST 34 01/19/2023   NA 133 (L) 01/22/2023   K 4.1 01/22/2023   CL 99 01/22/2023   CREATININE 0.98 01/22/2023   BUN 11 01/22/2023   CO2 25 01/22/2023   TSH  1.447 01/19/2023   HGBA1C 5.4 01/19/2023    Assessment / Plan:      Aortic Stenosis, severe in setting of Bicuspid Valve CAD- 2 vessel CAD, presented with NSTEMI DM HTN HLD H/O CVA, no  residual deficits Poor Dentition- recent cavity filling in last 6-12 months, would get dental imaging prior to proceeding Dispo- patient stable, mild chest pain at times... will need CABG/AVR.Marland Kitchen will need dental imaging.Marland Kitchen if this is clear of abscess possibly for surgery tomorrow... Dr. Cyndia Bent will evaluate patient and follow up with further recommendations  I  spent 55 minutes counseling the patient face to face.  Ellwood Handler, PA-C 01/22/2023 12:04 PM    Chart reviewed, patient examined, agree with above. He reports a several year history of shortness of breath and chest tightness that has recently progressed to the point that he can't do much activity without giving out. He presented with a hs Trop I of 1436 and a BNP of 898. Echo shows an EF of 30-35% with a severely calcified aortic valve with severe AS. The LV is moderately dilated at 6.2 cm with global hypokinesis and grade 3 DD. Prior echo in 2014 showed an EF of 55-60%. Cath shows occlusion of the ostial RCA with filling of the distal vessel by collat from the LAD which has 90% mid vessel stenosis. Right heart pressures are fairly normal with a PCW pressure of 17 mean. I agree with need for AVR and CABG. He reports a history of 60 lb weight loss over the past 6 months but says he changed his diet and was trying to lose weight to get his DM under better control. He had colonoscopy in July 2023 with removal of several polyps that were benign by path. I discussed the operative procedure with the patient and family including alternatives, benefits and risks; including but not limited to bleeding, blood transfusion, infection, stroke, myocardial infarction, graft failure, heart block requiring a permanent pacemaker, organ dysfunction, and death.  Langley Adie understands and agrees to proceed.  We will schedule surgery for tomorrow.

## 2023-01-22 NOTE — Progress Notes (Signed)
Heart Failure Navigator Progress Note  Following this hospitalization to assess for HV TOC readiness.   EF 30-35% G3DD Cath: 2 vessel disease, CVTS consult for CABG.  Will plan to follow .   Earnestine Leys, BSN, Clinical cytogeneticist Only

## 2023-01-22 NOTE — Progress Notes (Signed)
PROGRESS NOTE    Raymond Roberson  TXM:[id number] DOB: October 03, 1956 DOA: 01/19/2023 PCP: Caryl Bis, MD  Chief Complaint  Patient presents with   Chest Pain    Brief Narrative:   Raymond Roberson is Raymond Roberson 67 y.o. male with medical history significant of HTN, IIDM, gout, anxiety/depression, presented with worsening of chest pain and shortness of breath.   Found to have an NSTEMI, systolic HF, aortic stenosis.  Assessment & Plan:   Principal Problem:   NSTEMI (non-ST elevated myocardial infarction) ([place]) Active Problems:   Acute on chronic systolic CHF (congestive heart failure) (HCC)   HTN (hypertension)  NSTEMI -Continue ACS medications including aspirin, heparin drip -Increase statin to 40 mg daily -Start Coreg 3.125 twice daily and lisinopril 5 mg daily -L/RHC 1/29 with severe AS with moderately severe LV dysfunction and 2 vessel disease.  Large dominant RCA occluded at ostium with grade III left to right collaterals and high grade calcified proximal LAD stenosis.  TCTS c/s for eval for CABG/SAVR - plan for w/u per TCTS - pending orthopantogram -appreciate cards -Other Ddx, D-dimer negative, PE unlikely   Acute Combined Systolic and Diastolic Heart failure -echo with EF 30-35%, severe AS, grade III diastolic dysfunction -CXR with trace effusions, pulm venous congestion -diuresis per cards -appreciate cards recs -strict I/O, daily weights  Severe Aortic Stenosis - per cards/CT surgery - TCTS has been c/s   Hypokalemia -P.o. replacement   HTN -As above   IIDM -Hold off metformin for incoming cath -Patient was diagnosed with diabetes about 6 months ago and has been on metformin.  A1c now 5.4.    -Patient also reported frequent feeling of bilateral feet numbness, clinically suspect diabetic neuropathy.     Weight Loss - 60 lbs over past 6 months, needs outpatient workup - ensure he's up to date with routine cancer screening, etc - maybe gastroparesis, though  would expect BG's to be more poorly controlled - will go ahead and get CT C/Braley Luckenbaugh/P   Hx of ED -D/W patient regarding safety of using Viagara, and avoid to use when on Nitro. Patient agreed.   Gout, anxiety/depression -Stable, continue allopurinol and SSRI  Tobacco Abuse Encourage cessation     DVT prophylaxis: heparin Code Status: full Family Communication: none Disposition:   Status is: Inpatient Remains inpatient appropriate because: pendingcards procedure   Consultants:  cardiology  Procedures:  Echo IMPRESSIONS     1. Calcified aortic valve with inderminant number of cusps; probable  severe AS with mean gradient as high as 37 mmHg; mild AI.   2. Left ventricular ejection fraction, by estimation, is 30 to 35%. The  left ventricle has moderate to severely decreased function. The left  ventricle demonstrates global hypokinesis. The left ventricular internal  cavity size was moderately dilated.  Left ventricular diastolic parameters are consistent with Grade III  diastolic dysfunction (restrictive).   3. Right ventricular systolic function is mildly reduced. The right  ventricular size is normal.   4. The mitral valve is degenerative. Mild mitral valve regurgitation. No  evidence of mitral stenosis.   5. The aortic valve is calcified. Aortic valve regurgitation is mild.  Severe aortic valve stenosis. Aortic regurgitation PHT measures 220 msec.  Aortic valve area, by VTI measures 1.43 cm. Aortic valve mean gradient  measures 33.7 mmHg. Aortic valve Vmax   measures 3.63 m/s.   6. The inferior vena cava is dilated in size with >50% respiratory  variability, suggesting right atrial pressure of 8 mmHg.  Antimicrobials:  Anti-infectives (From admission, onward)    None       Subjective: No new complaints  Objective: Vitals:   01/21/23 0841 01/21/23 1010 01/21/23 1155 01/21/23 1704  BP: 125/71 (!) 112/96 108/74 106/75  Pulse: 81 88 71 71  Resp:  18 18    Temp: 97.8 F (36.6 C) 98.2 F (36.8 C) 98.6 F (37 C) 98.2 F (36.8 C)  TempSrc: Oral Oral Oral Oral  SpO2: 96% 97% 91%   Weight:      Height:        Intake/Output Summary (Last 24 hours) at 01/21/2023 1907 Last data filed at 01/21/2023 1600 Gross per 24 hour  Intake 581.56 ml  Output 725 ml  Net -143.44 ml   Filed Weights   01/19/23 2113 01/20/23 0508 01/21/23 0651  Weight: 83.8 kg 83.8 kg 81.7 kg    Examination:  General: No acute distress. Cardiovascular: RRR Lungs:  unlabored Abdomen: Soft, nontender, nondistended  Neurological: Alert and oriented 3. Moves all extremities 4 with equal strength. Cranial nerves II through XII grossly intact. Extremities: No clubbing or cyanosis. No edema.   Data Reviewed: I have personally reviewed following labs and imaging studies  CBC: Recent Labs  Lab 01/19/23 1543 01/20/23 0114 01/21/23 0054  WBC 8.5 8.1 9.5  NEUTROABS 6.8  --   --   HGB 12.5* 12.4* 12.5*  HCT 38.2* 39.0 37.6*  MCV 84.7 85.7 85.1  PLT 310 315 939    Basic Metabolic Panel: Recent Labs  Lab 01/19/23 1543 01/20/23 0114 01/21/23 0054  NA 137 138 137  K 3.4* 3.6 3.6  CL 104 103 102  CO2 '24 27 27  '$ GLUCOSE 144* 101* 122*  BUN '8 9 10  '$ CREATININE 0.92 0.94 0.95  CALCIUM 9.1 8.9 8.9    GFR: Estimated Creatinine Clearance: 86.2 mL/min (by C-G formula based on SCr of 0.95 mg/dL).  Liver Function Tests: Recent Labs  Lab 01/19/23 1543  AST 34  ALT 20  ALKPHOS 41  BILITOT 1.0  PROT 6.4*  ALBUMIN 3.3*    CBG: Recent Labs  Lab 01/20/23 1633 01/20/23 2122 01/21/23 0846 01/21/23 1152 01/21/23 1701  GLUCAP 95 108* 143* 117* 96     No results found for this or any previous visit (from the past 240 hour(s)).       Radiology Studies: ECHOCARDIOGRAM COMPLETE  Result Date: 01/20/2023    ECHOCARDIOGRAM REPORT   Patient Name:   Raymond Roberson Date of Exam: 01/20/2023 Medical Rec #:  [id number]        Height:       72.0 in Accession #:     [id number]       Weight:       184.7 lb Date of Birth:  08-Dec-1956       BSA:          2.060 m Patient Age:    78 years         BP:           113/63 mmHg Patient Gender: M                HR:           80 bpm. Exam Location:  Inpatient Procedure: 2D Echo, Cardiac Doppler, Color Doppler and Intracardiac            Opacification Agent Indications:    CHF-Acute Systolic K56.25  History:        Patient has prior history  of Echocardiogram examinations, most                 recent 07/09/2013. Stroke; Risk Factors:Hypertension, Diabetes                 and Sleep Apnea. History of bicuspid aortic valve.  Sonographer:    Darlina Sicilian RDCS Referring Phys: [id number] [place]  1. Calcified aortic valve with inderminant number of cusps; probable severe AS with mean gradient as high as 37 mmHg; mild AI.  2. Left ventricular ejection fraction, by estimation, is 30 to 35%. The left ventricle has moderate to severely decreased function. The left ventricle demonstrates global hypokinesis. The left ventricular internal cavity size was moderately dilated. Left ventricular diastolic parameters are consistent with Grade III diastolic dysfunction (restrictive).  3. Right ventricular systolic function is mildly reduced. The right ventricular size is normal.  4. The mitral valve is degenerative. Mild mitral valve regurgitation. No evidence of mitral stenosis.  5. The aortic valve is calcified. Aortic valve regurgitation is mild. Severe aortic valve stenosis. Aortic regurgitation PHT measures 220 msec. Aortic valve area, by VTI measures 1.43 cm. Aortic valve mean gradient measures 33.7 mmHg. Aortic valve Vmax  measures 3.63 m/s.  6. The inferior vena cava is dilated in size with >50% respiratory variability, suggesting right atrial pressure of 8 mmHg. FINDINGS  Left Ventricle: Left ventricular ejection fraction, by estimation, is 30 to 35%. The left ventricle has moderate to severely decreased function. The left ventricle  demonstrates global hypokinesis. Definity contrast agent was given IV to delineate the left ventricular endocardial borders. The left ventricular internal cavity size was moderately dilated. There is no left ventricular hypertrophy. Left ventricular diastolic parameters are consistent with Grade III diastolic dysfunction (restrictive). Right Ventricle: The right ventricular size is normal. Right ventricular systolic function is mildly reduced. Left Atrium: Left atrial size was normal in size. Right Atrium: Right atrial size was normal in size. Pericardium: There is no evidence of pericardial effusion. Mitral Valve: The mitral valve is degenerative in appearance. Mild mitral annular calcification. Mild mitral valve regurgitation. No evidence of mitral valve stenosis. Tricuspid Valve: The tricuspid valve is grossly normal. Tricuspid valve regurgitation is trivial. No evidence of tricuspid stenosis. Aortic Valve: The aortic valve is calcified. Aortic valve regurgitation is mild. Aortic regurgitation PHT measures 220 msec. Severe aortic stenosis is present. Aortic valve mean gradient measures 33.7 mmHg. Aortic valve peak gradient measures 52.8 mmHg. Aortic valve area, by VTI measures 1.43 cm. Pulmonic Valve: The pulmonic valve was grossly normal. Pulmonic valve regurgitation is trivial. No evidence of pulmonic stenosis. Aorta: The aortic root and ascending aorta are structurally normal, with no evidence of dilitation. Venous: The inferior vena cava is dilated in size with greater than 50% respiratory variability, suggesting right atrial pressure of 8 mmHg. IAS/Shunts: No atrial level shunt detected by color flow Doppler. Additional Comments: Calcified aortic valve with inderminant number of cusps; probable severe AS with mean gradient as high as 37 mmHg; mild AI.  LEFT VENTRICLE PLAX 2D LVIDd:         6.20 cm      Diastology LVIDs:         5.00 cm      LV e' medial:    4.60 cm/s LV PW:         0.90 cm      LV E/e'  medial:  28.4 LV IVS:        0.90 cm  LV e' lateral:   5.36 cm/s LVOT diam:     2.50 cm      LV E/e' lateral: 24.3 LV SV:         104 LV SV Index:   51 LVOT Area:     4.91 cm                              3D Volume EF: LV Volumes (MOD)            3D EF:        29 % LV vol d, MOD A2C: 271.0 ml LV EDV:       241 ml LV vol d, MOD A4C: 217.0 ml LV ESV:       172 ml LV vol s, MOD A2C: 184.0 ml LV SV:        69 ml LV vol s, MOD A4C: 137.0 ml LV SV MOD A2C:     87.0 ml LV SV MOD A4C:     217.0 ml LV SV MOD BP:      92.8 ml RIGHT VENTRICLE RV S prime:     12.20 cm/s TAPSE (M-mode): 1.9 cm LEFT ATRIUM             Index        RIGHT ATRIUM           Index LA Vol (A2C):   60.7 ml 29.47 ml/m  RA Area:     18.80 cm LA Vol (A4C):   65.5 ml 31.80 ml/m  RA Volume:   52.50 ml  25.49 ml/m LA Biplane Vol: 63.4 ml 30.78 ml/m  AORTIC VALVE AV Area (Vmax):    1.28 cm AV Area (Vmean):   1.21 cm AV Area (VTI):     1.43 cm AV Vmax:           363.33 cm/s AV Vmean:          275.000 cm/s AV VTI:            0.726 m AV Peak Grad:      52.8 mmHg AV Mean Grad:      33.7 mmHg LVOT Vmax:         94.70 cm/s LVOT Vmean:        68.000 cm/s LVOT VTI:          0.212 m LVOT/AV VTI ratio: 0.29 AI PHT:            220 msec  AORTA Ao Root diam: 3.10 cm Ao Asc diam:  3.40 cm MITRAL VALVE MV Area (PHT): 6.83 cm     SHUNTS MV Decel Time: 111 msec     Systemic VTI:  0.21 m MR Peak grad: 71.9 mmHg     Systemic Diam: 2.50 cm MR Mean grad: 41.0 mmHg MR Vmax:      424.00 cm/s MR Vmean:     289.0 cm/s MV E velocity: 130.50 cm/s MV Cait Locust velocity: 45.60 cm/s MV E/Keliyah Spillman ratio:  2.86 Kirk Ruths MD Electronically signed by Kirk Ruths MD Signature Date/Time: 01/20/2023/3:42:41 PM    Final    DG Chest 1 View  Result Date: 01/20/2023 CLINICAL DATA:  67 year old male with history of congestive heart failure. EXAM: CHEST  1 VIEW COMPARISON:  Chest x-ray 01/19/2023. FINDINGS: Lung volumes are normal. Trace bilateral pleural effusions. No confluent consolidative  airspace disease. No pneumothorax. Cephalization of the pulmonary vasculature, without frank pulmonary edema. Heart size  is mildly enlarged. Upper mediastinal contours are within normal limits. Old healed fractures of the posterolateral right seventh and eighth ribs. IMPRESSION: 1. Trace bilateral pleural effusions. 2. Cardiomegaly with pulmonary venous congestion. Electronically Signed   By: Vinnie Langton M.D.   On: 01/20/2023 09:08        Scheduled Meds:  allopurinol  300 mg Oral Daily   ALPRAZolam  0.5 mg Oral BID   angioplasty book   Does not apply Once   [START ON 01/22/2023] aspirin  81 mg Oral Pre-Cath   aspirin EC  81 mg Oral Daily   atorvastatin  40 mg Oral Daily   carvedilol  3.125 mg Oral BID WC   citalopram  20 mg Oral Daily   cycloSPORINE  1 drop Both Eyes Daily   HYDROcodone-acetaminophen  1 tablet Oral QID   insulin aspart  0-15 Units Subcutaneous TID WC   lisinopril  5 mg Oral Daily   loratadine  10 mg Oral Daily   pantoprazole  40 mg Oral Daily   sodium chloride flush  3 mL Intravenous Q12H   sodium chloride flush  3 mL Intravenous Q12H   Continuous Infusions:  sodium chloride     sodium chloride     [START ON 01/22/2023] sodium chloride     heparin 1,650 Units/hr (01/21/23 1800)     LOS: 2 days    Time spent: over 30 min    Fayrene Helper, MD [facility]   To contact the attending provider between 7A-7P or the covering provider during after hours 7P-7A, please log into the web site [email or web address] and access using universal [place] password for that web site. If you do not have the password, please call the hospital operator.  01/21/2023, 7:07 PM

## 2023-01-23 ENCOUNTER — Encounter (HOSPITAL_COMMUNITY): Payer: Self-pay | Admitting: Internal Medicine

## 2023-01-23 ENCOUNTER — Inpatient Hospital Stay (HOSPITAL_COMMUNITY)
Admission: EM | Disposition: A | Discharge status: Home / Self Care | Payer: Self-pay | Source: Home / Self Care | Attending: Surgery

## 2023-01-23 ENCOUNTER — Inpatient Hospital Stay (HOSPITAL_COMMUNITY): Payer: Medicare HMO

## 2023-01-23 DIAGNOSIS — I25119 Atherosclerotic heart disease of native coronary artery with unspecified angina pectoris: Secondary | ICD-10-CM

## 2023-01-23 DIAGNOSIS — I110 Hypertensive heart disease with heart failure: Secondary | ICD-10-CM

## 2023-01-23 DIAGNOSIS — I350 Nonrheumatic aortic (valve) stenosis: Secondary | ICD-10-CM

## 2023-01-23 DIAGNOSIS — I214 Non-ST elevation (NSTEMI) myocardial infarction: Secondary | ICD-10-CM | POA: Diagnosis not present

## 2023-01-23 DIAGNOSIS — I509 Heart failure, unspecified: Secondary | ICD-10-CM | POA: Diagnosis not present

## 2023-01-23 DIAGNOSIS — I2510 Atherosclerotic heart disease of native coronary artery without angina pectoris: Secondary | ICD-10-CM | POA: Diagnosis not present

## 2023-01-23 DIAGNOSIS — Z952 Presence of prosthetic heart valve: Secondary | ICD-10-CM

## 2023-01-23 DIAGNOSIS — R570 Cardiogenic shock: Secondary | ICD-10-CM | POA: Diagnosis not present

## 2023-01-23 HISTORY — PX: TEE WITHOUT CARDIOVERSION: SHX5443

## 2023-01-23 HISTORY — PX: AORTIC VALVE REPLACEMENT: SHX41

## 2023-01-23 HISTORY — PX: CORONARY ARTERY BYPASS GRAFT: SHX141

## 2023-01-23 LAB — CBC WITH DIFFERENTIAL/PLATELET
Abs Immature Granulocytes: 0.31 10*3/uL — ABNORMAL HIGH (ref 0.00–0.07)
Basophils Absolute: 0 10*3/uL (ref 0.0–0.1)
Basophils Relative: 0 %
Eosinophils Absolute: 0 10*3/uL (ref 0.0–0.5)
Eosinophils Relative: 0 %
HCT: 34.2 % — ABNORMAL LOW (ref 39.0–52.0)
Hemoglobin: 11.4 g/dL — ABNORMAL LOW (ref 13.0–17.0)
Immature Granulocytes: 2 %
Lymphocytes Relative: 3 %
Lymphs Abs: 0.5 10*3/uL — ABNORMAL LOW (ref 0.7–4.0)
MCH: 27.7 pg (ref 26.0–34.0)
MCHC: 33.3 g/dL (ref 30.0–36.0)
MCV: 83 fL (ref 80.0–100.0)
Monocytes Absolute: 0.9 10*3/uL (ref 0.1–1.0)
Monocytes Relative: 4 %
Neutro Abs: 17.5 10*3/uL — ABNORMAL HIGH (ref 1.7–7.7)
Neutrophils Relative %: 91 %
Platelets: 370 10*3/uL (ref 150–400)
RBC: 4.12 MIL/uL — ABNORMAL LOW (ref 4.22–5.81)
RDW: 12.5 % (ref 11.5–15.5)
WBC: 19.2 10*3/uL — ABNORMAL HIGH (ref 4.0–10.5)
nRBC: 0 % (ref 0.0–0.2)

## 2023-01-23 LAB — POCT I-STAT 7, (LYTES, BLD GAS, ICA,H+H)
Acid-Base Excess: 3 mmol/L — ABNORMAL HIGH (ref 0.0–2.0)
Acid-Base Excess: 2 mmol/L (ref 0.0–2.0)
Acid-Base Excess: 0 mmol/L (ref 0.0–2.0)
Acid-Base Excess: 2 mmol/L (ref 0.0–2.0)
Acid-base deficit: 1 mmol/L (ref 0.0–2.0)
Acid-base deficit: 1 mmol/L (ref 0.0–2.0)
Bicarbonate: 28.2 mmol/L — ABNORMAL HIGH (ref 20.0–28.0)
Bicarbonate: 26.5 mmol/L (ref 20.0–28.0)
Bicarbonate: 26 mmol/L (ref 20.0–28.0)
Bicarbonate: 27.9 mmol/L (ref 20.0–28.0)
Bicarbonate: 24.3 mmol/L (ref 20.0–28.0)
Bicarbonate: 23.8 mmol/L (ref 20.0–28.0)
Calcium, Ion: 1.06 mmol/L — ABNORMAL LOW (ref 1.15–1.40)
Calcium, Ion: 1.16 mmol/L (ref 1.15–1.40)
Calcium, Ion: 1.34 mmol/L (ref 1.15–1.40)
Calcium, Ion: 1.19 mmol/L (ref 1.15–1.40)
Calcium, Ion: 1.25 mmol/L (ref 1.15–1.40)
Calcium, Ion: 1.22 mmol/L (ref 1.15–1.40)
HCT: 29 % — ABNORMAL LOW (ref 39.0–52.0)
HCT: 31 % — ABNORMAL LOW (ref 39.0–52.0)
HCT: 34 % — ABNORMAL LOW (ref 39.0–52.0)
HCT: 34 % — ABNORMAL LOW (ref 39.0–52.0)
HCT: 34 % — ABNORMAL LOW (ref 39.0–52.0)
HCT: 33 % — ABNORMAL LOW (ref 39.0–52.0)
Hemoglobin: 9.9 g/dL — ABNORMAL LOW (ref 13.0–17.0)
Hemoglobin: 10.5 g/dL — ABNORMAL LOW (ref 13.0–17.0)
Hemoglobin: 11.6 g/dL — ABNORMAL LOW (ref 13.0–17.0)
Hemoglobin: 11.6 g/dL — ABNORMAL LOW (ref 13.0–17.0)
Hemoglobin: 11.6 g/dL — ABNORMAL LOW (ref 13.0–17.0)
Hemoglobin: 11.2 g/dL — ABNORMAL LOW (ref 13.0–17.0)
O2 Saturation: 100 %
O2 Saturation: 100 %
O2 Saturation: 97 %
O2 Saturation: 98 %
O2 Saturation: 93 %
O2 Saturation: 96 %
Patient temperature: 36
Patient temperature: 37.1
Patient temperature: 37.4
Patient temperature: 37.6
Potassium: 3.7 mmol/L (ref 3.5–5.1)
Potassium: 4.6 mmol/L (ref 3.5–5.1)
Potassium: 3.4 mmol/L — ABNORMAL LOW (ref 3.5–5.1)
Potassium: 5.6 mmol/L — ABNORMAL HIGH (ref 3.5–5.1)
Potassium: 4.4 mmol/L (ref 3.5–5.1)
Potassium: 4.3 mmol/L (ref 3.5–5.1)
Sodium: 138 mmol/L (ref 135–145)
Sodium: 136 mmol/L (ref 135–145)
Sodium: 138 mmol/L (ref 135–145)
Sodium: 136 mmol/L (ref 135–145)
Sodium: 137 mmol/L (ref 135–145)
Sodium: 135 mmol/L (ref 135–145)
TCO2: 30 mmol/L (ref 22–32)
TCO2: 28 mmol/L (ref 22–32)
TCO2: 27 mmol/L (ref 22–32)
TCO2: 29 mmol/L (ref 22–32)
TCO2: 26 mmol/L (ref 22–32)
TCO2: 25 mmol/L (ref 22–32)
pCO2 arterial: 47.7 mmHg (ref 32–48)
pCO2 arterial: 41.7 mmHg (ref 32–48)
pCO2 arterial: 46.1 mmHg (ref 32–48)
pCO2 arterial: 46 mmHg (ref 32–48)
pCO2 arterial: 44.1 mmHg (ref 32–48)
pCO2 arterial: 41.6 mmHg (ref 32–48)
pH, Arterial: 7.38 (ref 7.35–7.45)
pH, Arterial: 7.411 (ref 7.35–7.45)
pH, Arterial: 7.355 (ref 7.35–7.45)
pH, Arterial: 7.392 (ref 7.35–7.45)
pH, Arterial: 7.35 (ref 7.35–7.45)
pH, Arterial: 7.368 (ref 7.35–7.45)
pO2, Arterial: 322 mmHg — ABNORMAL HIGH (ref 83–108)
pO2, Arterial: 295 mmHg — ABNORMAL HIGH (ref 83–108)
pO2, Arterial: 88 mmHg (ref 83–108)
pO2, Arterial: 106 mmHg (ref 83–108)
pO2, Arterial: 71 mmHg — ABNORMAL LOW (ref 83–108)
pO2, Arterial: 89 mmHg (ref 83–108)

## 2023-01-23 LAB — POCT I-STAT, CHEM 8
BUN: 9 mg/dL (ref 8–23)
BUN: 8 mg/dL (ref 8–23)
BUN: 9 mg/dL (ref 8–23)
BUN: 9 mg/dL (ref 8–23)
Calcium, Ion: 1.22 mmol/L (ref 1.15–1.40)
Calcium, Ion: 1.18 mmol/L (ref 1.15–1.40)
Calcium, Ion: 1.13 mmol/L — ABNORMAL LOW (ref 1.15–1.40)
Calcium, Ion: 1.12 mmol/L — ABNORMAL LOW (ref 1.15–1.40)
Chloride: 98 mmol/L (ref 98–111)
Chloride: 101 mmol/L (ref 98–111)
Chloride: 99 mmol/L (ref 98–111)
Chloride: 98 mmol/L (ref 98–111)
Creatinine, Ser: 0.6 mg/dL — ABNORMAL LOW (ref 0.61–1.24)
Creatinine, Ser: 0.5 mg/dL — ABNORMAL LOW (ref 0.61–1.24)
Creatinine, Ser: 0.6 mg/dL — ABNORMAL LOW (ref 0.61–1.24)
Creatinine, Ser: 0.6 mg/dL — ABNORMAL LOW (ref 0.61–1.24)
Glucose, Bld: 105 mg/dL — ABNORMAL HIGH (ref 70–99)
Glucose, Bld: 94 mg/dL (ref 70–99)
Glucose, Bld: 101 mg/dL — ABNORMAL HIGH (ref 70–99)
Glucose, Bld: 182 mg/dL — ABNORMAL HIGH (ref 70–99)
HCT: 36 % — ABNORMAL LOW (ref 39.0–52.0)
HCT: 33 % — ABNORMAL LOW (ref 39.0–52.0)
HCT: 31 % — ABNORMAL LOW (ref 39.0–52.0)
HCT: 28 % — ABNORMAL LOW (ref 39.0–52.0)
Hemoglobin: 12.2 g/dL — ABNORMAL LOW (ref 13.0–17.0)
Hemoglobin: 11.2 g/dL — ABNORMAL LOW (ref 13.0–17.0)
Hemoglobin: 10.5 g/dL — ABNORMAL LOW (ref 13.0–17.0)
Hemoglobin: 9.5 g/dL — ABNORMAL LOW (ref 13.0–17.0)
Potassium: 3.6 mmol/L (ref 3.5–5.1)
Potassium: 3.6 mmol/L (ref 3.5–5.1)
Potassium: 4.4 mmol/L (ref 3.5–5.1)
Potassium: 3.6 mmol/L (ref 3.5–5.1)
Sodium: 138 mmol/L (ref 135–145)
Sodium: 138 mmol/L (ref 135–145)
Sodium: 136 mmol/L (ref 135–145)
Sodium: 137 mmol/L (ref 135–145)
TCO2: 29 mmol/L (ref 22–32)
TCO2: 26 mmol/L (ref 22–32)
TCO2: 30 mmol/L (ref 22–32)
TCO2: 28 mmol/L (ref 22–32)

## 2023-01-23 LAB — MAGNESIUM: Magnesium: 3.2 mg/dL — ABNORMAL HIGH (ref 1.7–2.4)

## 2023-01-23 LAB — POCT I-STAT EG7
Acid-Base Excess: 3 mmol/L — ABNORMAL HIGH (ref 0.0–2.0)
Bicarbonate: 28.6 mmol/L — ABNORMAL HIGH (ref 20.0–28.0)
Calcium, Ion: 1.13 mmol/L — ABNORMAL LOW (ref 1.15–1.40)
HCT: 30 % — ABNORMAL LOW (ref 39.0–52.0)
Hemoglobin: 10.2 g/dL — ABNORMAL LOW (ref 13.0–17.0)
O2 Saturation: 78 %
Potassium: 3.6 mmol/L (ref 3.5–5.1)
Sodium: 139 mmol/L (ref 135–145)
TCO2: 30 mmol/L (ref 22–32)
pCO2, Ven: 50.1 mmHg (ref 44–60)
pH, Ven: 7.365 (ref 7.25–7.43)
pO2, Ven: 44 mmHg (ref 32–45)

## 2023-01-23 LAB — GLUCOSE, CAPILLARY
Glucose-Capillary: 129 mg/dL — ABNORMAL HIGH (ref 70–99)
Glucose-Capillary: 130 mg/dL — ABNORMAL HIGH (ref 70–99)
Glucose-Capillary: 131 mg/dL — ABNORMAL HIGH (ref 70–99)
Glucose-Capillary: 138 mg/dL — ABNORMAL HIGH (ref 70–99)
Glucose-Capillary: 144 mg/dL — ABNORMAL HIGH (ref 70–99)
Glucose-Capillary: 149 mg/dL — ABNORMAL HIGH (ref 70–99)
Glucose-Capillary: 149 mg/dL — ABNORMAL HIGH (ref 70–99)
Glucose-Capillary: 150 mg/dL — ABNORMAL HIGH (ref 70–99)
Glucose-Capillary: 153 mg/dL — ABNORMAL HIGH (ref 70–99)
Glucose-Capillary: 163 mg/dL — ABNORMAL HIGH (ref 70–99)
Glucose-Capillary: 170 mg/dL — ABNORMAL HIGH (ref 70–99)
Glucose-Capillary: 89 mg/dL (ref 70–99)

## 2023-01-23 LAB — COOXEMETRY PANEL
Carboxyhemoglobin: 2.1 % — ABNORMAL HIGH (ref 0.5–1.5)
Methemoglobin: <0.7 % (ref 0.0–1.5)
O2 Saturation: 72.3 %
Total hemoglobin: 11.5 g/dL — ABNORMAL LOW (ref 12.0–16.0)

## 2023-01-23 LAB — BASIC METABOLIC PANEL
Anion gap: 10 (ref 5–15)
BUN: 10 mg/dL (ref 8–23)
CO2: 23 mmol/L (ref 22–32)
Calcium: 8.4 mg/dL — ABNORMAL LOW (ref 8.9–10.3)
Chloride: 103 mmol/L (ref 98–111)
Creatinine, Ser: 0.85 mg/dL (ref 0.61–1.24)
GFR, Estimated: >60 mL/min (ref >60)
Glucose, Bld: 133 mg/dL — ABNORMAL HIGH (ref 70–99)
Potassium: 4.5 mmol/L (ref 3.5–5.1)
Sodium: 136 mmol/L (ref 135–145)

## 2023-01-23 LAB — PROTIME-INR
INR: 1.3 — ABNORMAL HIGH (ref 0.8–1.2)
Prothrombin Time: 16 seconds — ABNORMAL HIGH (ref 11.4–15.2)

## 2023-01-23 LAB — CBC
HCT: 34.1 % — ABNORMAL LOW (ref 39.0–52.0)
Hemoglobin: 11.5 g/dL — ABNORMAL LOW (ref 13.0–17.0)
MCH: 28.3 pg (ref 26.0–34.0)
MCHC: 33.7 g/dL (ref 30.0–36.0)
MCV: 84 fL (ref 80.0–100.0)
Platelets: 325 10*3/uL (ref 150–400)
RBC: 4.06 MIL/uL — ABNORMAL LOW (ref 4.22–5.81)
RDW: 12.6 % (ref 11.5–15.5)
WBC: 22 10*3/uL — ABNORMAL HIGH (ref 4.0–10.5)
nRBC: 0 % (ref 0.0–0.2)

## 2023-01-23 LAB — HEMOGLOBIN AND HEMATOCRIT, BLOOD
HCT: 31.4 % — ABNORMAL LOW (ref 39.0–52.0)
Hemoglobin: 10.5 g/dL — ABNORMAL LOW (ref 13.0–17.0)

## 2023-01-23 LAB — PLATELET COUNT: Platelets: 311 10*3/uL (ref 150–400)

## 2023-01-23 LAB — APTT: aPTT: 32 seconds (ref 24–36)

## 2023-01-23 SURGERY — CORONARY ARTERY BYPASS GRAFTING (CABG)
Anesthesia: General | Site: Chest

## 2023-01-23 MED ORDER — ACETAMINOPHEN 500 MG PO TABS
1000.0000 mg | ORAL_TABLET | Freq: Four times a day (QID) | ORAL | Status: DC
  Administered 2023-01-24 – 2023-01-27 (×13): 1000 mg via ORAL
  Filled 2023-01-23 (×11): qty 2

## 2023-01-23 MED ORDER — ATORVASTATIN CALCIUM 80 MG PO TABS
80.0000 mg | ORAL_TABLET | Freq: Every day | ORAL | Status: DC
  Administered 2023-01-24 – 2023-01-26 (×3): 80 mg via ORAL
  Filled 2023-01-23 (×4): qty 1

## 2023-01-23 MED ORDER — ROCURONIUM BROMIDE 10 MG/ML (PF) SYRINGE
PREFILLED_SYRINGE | INTRAVENOUS | Status: DC | PRN
  Administered 2023-01-23: 30 mg via INTRAVENOUS
  Administered 2023-01-23: 50 mg via INTRAVENOUS
  Administered 2023-01-23: 30 mg via INTRAVENOUS
  Administered 2023-01-23: 70 mg via INTRAVENOUS
  Administered 2023-01-23: 20 mg via INTRAVENOUS

## 2023-01-23 MED ORDER — ALBUMIN HUMAN 5 % IV SOLN: 250.0000 mL | INTRAVENOUS | Status: DC | PRN

## 2023-01-23 MED ORDER — POTASSIUM CHLORIDE 10 MEQ/50ML IV SOLN
10.0000 meq | INTRAVENOUS | Status: AC
  Administered 2023-01-23 (×3): 10 meq via INTRAVENOUS

## 2023-01-23 MED ORDER — BISACODYL 5 MG PO TBEC
10.0000 mg | DELAYED_RELEASE_TABLET | Freq: Every day | ORAL | Status: DC
  Administered 2023-01-24 – 2023-01-26 (×3): 10 mg via ORAL
  Filled 2023-01-23 (×4): qty 2

## 2023-01-23 MED ORDER — THROMBIN (RECOMBINANT) 20000 UNITS EX SOLR
CUTANEOUS | Status: AC
  Filled 2023-01-23: qty 20000

## 2023-01-23 MED ORDER — METOPROLOL TARTRATE 12.5 MG HALF TABLET: 12.5000 mg | ORAL_TABLET | Freq: Two times a day (BID) | ORAL | Status: DC

## 2023-01-23 MED ORDER — SODIUM CHLORIDE 0.9 % IV SOLN: INTRAVENOUS | Status: DC

## 2023-01-23 MED ORDER — NOREPINEPHRINE 4 MG/250ML-% IV SOLN
0.0000 ug/min | INTRAVENOUS | Status: DC
  Administered 2023-01-23: 12 ug/min via INTRAVENOUS
  Administered 2023-01-24: 2 ug/min via INTRAVENOUS
  Filled 2023-01-23 (×2): qty 250

## 2023-01-23 MED ORDER — FENTANYL CITRATE (PF) 250 MCG/5ML IJ SOLN
INTRAMUSCULAR | Status: AC
  Filled 2023-01-23: qty 5

## 2023-01-23 MED ORDER — MORPHINE SULFATE (PF) 2 MG/ML IV SOLN
1.0000 mg | INTRAVENOUS | Status: DC | PRN
  Administered 2023-01-23 – 2023-01-24 (×8): 2 mg via INTRAVENOUS
  Filled 2023-01-23 (×8): qty 1

## 2023-01-23 MED ORDER — CEFAZOLIN SODIUM-DEXTROSE 2-4 GM/100ML-% IV SOLN
2.0000 g | Freq: Three times a day (TID) | INTRAVENOUS | Status: AC
  Administered 2023-01-23 – 2023-01-25 (×6): 2 g via INTRAVENOUS
  Filled 2023-01-23 (×6): qty 100

## 2023-01-23 MED ORDER — ACETAMINOPHEN 650 MG RE SUPP
650.0000 mg | Freq: Once | RECTAL | Status: AC
  Administered 2023-01-23: 650 mg via RECTAL

## 2023-01-23 MED ORDER — ALBUMIN HUMAN 5 % IV SOLN: INTRAVENOUS | Status: DC | PRN

## 2023-01-23 MED ORDER — LACTATED RINGERS IV SOLN: INTRAVENOUS | Status: DC | PRN

## 2023-01-23 MED ORDER — THROMBIN 20000 UNITS EX SOLR
CUTANEOUS | Status: DC | PRN
  Administered 2023-01-23: 20000 [IU] via TOPICAL

## 2023-01-23 MED ORDER — PHENYLEPHRINE HCL-NACL 20-0.9 MG/250ML-% IV SOLN: 0.0000 ug/min | INTRAVENOUS | Status: DC

## 2023-01-23 MED ORDER — SODIUM CHLORIDE 0.45 % IV SOLN: INTRAVENOUS | Status: DC | PRN

## 2023-01-23 MED ORDER — HEPARIN SODIUM (PORCINE) 1000 UNIT/ML IJ SOLN
INTRAMUSCULAR | Status: DC | PRN
  Administered 2023-01-23: 7000 [IU] via INTRAVENOUS
  Administered 2023-01-23: 5000 [IU] via INTRAVENOUS
  Administered 2023-01-23: 25000 [IU] via INTRAVENOUS

## 2023-01-23 MED ORDER — LACTATED RINGERS IV SOLN: INTRAVENOUS | Status: DC

## 2023-01-23 MED ORDER — ARTIFICIAL TEARS OPHTHALMIC OINT
TOPICAL_OINTMENT | OPHTHALMIC | Status: DC | PRN
  Administered 2023-01-23: 1 via OPHTHALMIC

## 2023-01-23 MED ORDER — PROPOFOL 10 MG/ML IV BOLUS
INTRAVENOUS | Status: AC
  Filled 2023-01-23: qty 20

## 2023-01-23 MED ORDER — ROCURONIUM BROMIDE 10 MG/ML (PF) SYRINGE
PREFILLED_SYRINGE | INTRAVENOUS | Status: AC
  Filled 2023-01-23: qty 20

## 2023-01-23 MED ORDER — FENTANYL CITRATE (PF) 250 MCG/5ML IJ SOLN
INTRAMUSCULAR | Status: DC | PRN
  Administered 2023-01-23 (×6): 100 ug via INTRAVENOUS
  Administered 2023-01-23 (×2): 50 ug via INTRAVENOUS
  Administered 2023-01-23: 250 ug via INTRAVENOUS
  Administered 2023-01-23 (×2): 100 ug via INTRAVENOUS
  Administered 2023-01-23: 50 ug via INTRAVENOUS

## 2023-01-23 MED ORDER — MAGNESIUM SULFATE 4 GM/100ML IV SOLN
4.0000 g | Freq: Once | INTRAVENOUS | Status: AC
  Administered 2023-01-23: 4 g via INTRAVENOUS
  Filled 2023-01-23: qty 100

## 2023-01-23 MED ORDER — CHLORHEXIDINE GLUCONATE 0.12 % MT SOLN
15.0000 mL | OROMUCOSAL | Status: AC
  Administered 2023-01-23: 15 mL via OROMUCOSAL

## 2023-01-23 MED ORDER — MIDAZOLAM HCL (PF) 5 MG/ML IJ SOLN
INTRAMUSCULAR | Status: DC | PRN
  Administered 2023-01-23 (×2): 2 mg via INTRAVENOUS
  Administered 2023-01-23: 1 mg via INTRAVENOUS
  Administered 2023-01-23 (×2): 2 mg via INTRAVENOUS
  Administered 2023-01-23: 3 mg via INTRAVENOUS

## 2023-01-23 MED ORDER — MIDAZOLAM HCL (PF) 10 MG/2ML IJ SOLN
INTRAMUSCULAR | Status: AC
  Filled 2023-01-23: qty 2

## 2023-01-23 MED ORDER — PHENYLEPHRINE 80 MCG/ML (10ML) SYRINGE FOR IV PUSH (FOR BLOOD PRESSURE SUPPORT)
PREFILLED_SYRINGE | INTRAVENOUS | Status: AC
  Filled 2023-01-23: qty 10

## 2023-01-23 MED ORDER — VANCOMYCIN HCL IN DEXTROSE 1-5 GM/200ML-% IV SOLN
1000.0000 mg | Freq: Once | INTRAVENOUS | Status: AC
  Administered 2023-01-23: 1000 mg via INTRAVENOUS
  Filled 2023-01-23: qty 200

## 2023-01-23 MED ORDER — ROCURONIUM BROMIDE 10 MG/ML (PF) SYRINGE
PREFILLED_SYRINGE | INTRAVENOUS | Status: AC
  Filled 2023-01-23: qty 10

## 2023-01-23 MED ORDER — METOPROLOL TARTRATE 25 MG/10 ML ORAL SUSPENSION: 12.5000 mg | Freq: Two times a day (BID) | ORAL | Status: DC

## 2023-01-23 MED ORDER — HEPARIN SODIUM (PORCINE) 1000 UNIT/ML IJ SOLN
INTRAMUSCULAR | Status: AC
  Filled 2023-01-23: qty 1

## 2023-01-23 MED ORDER — ALLOPURINOL 300 MG PO TABS
300.0000 mg | ORAL_TABLET | Freq: Every day | ORAL | Status: DC
  Administered 2023-01-24 – 2023-01-26 (×3): 300 mg via ORAL
  Filled 2023-01-23 (×4): qty 1

## 2023-01-23 MED ORDER — PROTAMINE SULFATE 10 MG/ML IV SOLN
INTRAVENOUS | Status: AC
  Filled 2023-01-23: qty 10

## 2023-01-23 MED ORDER — SODIUM CHLORIDE 0.9% FLUSH: 3.0000 mL | INTRAVENOUS | Status: DC | PRN

## 2023-01-23 MED ORDER — DEXTROSE 50 % IV SOLN: 0.0000 mL | INTRAVENOUS | Status: DC | PRN

## 2023-01-23 MED ORDER — MIDAZOLAM HCL 2 MG/2ML IJ SOLN: 2.0000 mg | INTRAMUSCULAR | Status: DC | PRN

## 2023-01-23 MED ORDER — BISACODYL 10 MG RE SUPP: 10.0000 mg | Freq: Every day | RECTAL | Status: DC

## 2023-01-23 MED ORDER — PLASMA-LYTE A IV SOLN
INTRAVENOUS | Status: DC | PRN
  Administered 2023-01-23: 300 mL via INTRAVASCULAR

## 2023-01-23 MED ORDER — EPHEDRINE 5 MG/ML INJ
INTRAVENOUS | Status: AC
  Filled 2023-01-23: qty 5

## 2023-01-23 MED ORDER — PHENYLEPHRINE 80 MCG/ML (10ML) SYRINGE FOR IV PUSH (FOR BLOOD PRESSURE SUPPORT)
PREFILLED_SYRINGE | INTRAVENOUS | Status: DC | PRN
  Administered 2023-01-23: 40 ug via INTRAVENOUS
  Administered 2023-01-23: 80 ug via INTRAVENOUS
  Administered 2023-01-23: 40 ug via INTRAVENOUS

## 2023-01-23 MED ORDER — PROTAMINE SULFATE 10 MG/ML IV SOLN
INTRAVENOUS | Status: DC | PRN
  Administered 2023-01-23: 300 mg via INTRAVENOUS

## 2023-01-23 MED ORDER — PROPOFOL 10 MG/ML IV BOLUS
INTRAVENOUS | Status: DC | PRN
  Administered 2023-01-23: 110 mg via INTRAVENOUS

## 2023-01-23 MED ORDER — LACTATED RINGERS IV SOLN: 500.0000 mL | Freq: Once | INTRAVENOUS | Status: DC | PRN

## 2023-01-23 MED ORDER — MIDAZOLAM HCL 2 MG/2ML IJ SOLN
INTRAMUSCULAR | Status: AC
  Filled 2023-01-23: qty 2

## 2023-01-23 MED ORDER — INSULIN REGULAR(HUMAN) IN NACL 100-0.9 UT/100ML-% IV SOLN
INTRAVENOUS | Status: DC
  Administered 2023-01-23: 6 [IU]/h via INTRAVENOUS
  Filled 2023-01-23: qty 100

## 2023-01-23 MED ORDER — SODIUM CHLORIDE 0.9% FLUSH
3.0000 mL | Freq: Two times a day (BID) | INTRAVENOUS | Status: DC
  Administered 2023-01-24 – 2023-01-26 (×6): 3 mL via INTRAVENOUS

## 2023-01-23 MED ORDER — OXYCODONE HCL 5 MG PO TABS
5.0000 mg | ORAL_TABLET | ORAL | Status: DC | PRN
  Administered 2023-01-23 – 2023-01-27 (×17): 10 mg via ORAL
  Filled 2023-01-23 (×17): qty 2

## 2023-01-23 MED ORDER — NITROGLYCERIN IN D5W 200-5 MCG/ML-% IV SOLN: 0.0000 ug/min | INTRAVENOUS | Status: DC

## 2023-01-23 MED ORDER — DOCUSATE SODIUM 100 MG PO CAPS
200.0000 mg | ORAL_CAPSULE | Freq: Every day | ORAL | Status: DC
  Administered 2023-01-24 – 2023-01-26 (×3): 200 mg via ORAL
  Filled 2023-01-23 (×4): qty 2

## 2023-01-23 MED ORDER — ACETAMINOPHEN 160 MG/5ML PO SOLN: 1000.0000 mg | Freq: Four times a day (QID) | ORAL | Status: DC

## 2023-01-23 MED ORDER — ASPIRIN 81 MG PO CHEW: 324.0000 mg | CHEWABLE_TABLET | Freq: Every day | ORAL | Status: DC

## 2023-01-23 MED ORDER — SODIUM CHLORIDE 0.9 % IV SOLN: 250.0000 mL | INTRAVENOUS | Status: DC

## 2023-01-23 MED ORDER — FAMOTIDINE IN NACL 20-0.9 MG/50ML-% IV SOLN
20.0000 mg | Freq: Two times a day (BID) | INTRAVENOUS | Status: AC
  Administered 2023-01-23: 20 mg via INTRAVENOUS
  Filled 2023-01-23: qty 50

## 2023-01-23 MED ORDER — SODIUM CHLORIDE 0.9 % IV SOLN: INTRAVENOUS | Status: DC | PRN

## 2023-01-23 MED ORDER — TRAMADOL HCL 50 MG PO TABS: 50.0000 mg | ORAL_TABLET | ORAL | Status: DC | PRN

## 2023-01-23 MED ORDER — CHLORHEXIDINE GLUCONATE CLOTH 2 % EX PADS
6.0000 | MEDICATED_PAD | Freq: Every day | CUTANEOUS | Status: DC
  Administered 2023-01-23 – 2023-01-26 (×4): 6 via TOPICAL

## 2023-01-23 MED ORDER — DEXMEDETOMIDINE HCL IN NACL 400 MCG/100ML IV SOLN
0.0000 ug/kg/h | INTRAVENOUS | Status: DC
  Filled 2023-01-23: qty 100

## 2023-01-23 MED ORDER — THROMBIN 20000 UNITS EX SOLR
OROMUCOSAL | Status: DC | PRN
  Administered 2023-01-23 (×3): 4 mL via TOPICAL

## 2023-01-23 MED ORDER — ONDANSETRON HCL 4 MG/2ML IJ SOLN: 4.0000 mg | Freq: Four times a day (QID) | INTRAMUSCULAR | Status: DC | PRN

## 2023-01-23 MED ORDER — CALCIUM CHLORIDE 10 % IV SOLN
INTRAVENOUS | Status: DC | PRN
  Administered 2023-01-23: 1 g via INTRAVENOUS

## 2023-01-23 MED ORDER — PROTAMINE SULFATE 10 MG/ML IV SOLN
INTRAVENOUS | Status: AC
  Filled 2023-01-23: qty 25

## 2023-01-23 MED ORDER — ACETAMINOPHEN 160 MG/5ML PO SOLN: 650.0000 mg | Freq: Once | ORAL | Status: AC

## 2023-01-23 MED ORDER — HEMOSTATIC AGENTS (NO CHARGE) OPTIME
TOPICAL | Status: DC | PRN
  Administered 2023-01-23: 1 via TOPICAL

## 2023-01-23 MED ORDER — ARTIFICIAL TEARS OPHTHALMIC OINT
TOPICAL_OINTMENT | OPHTHALMIC | Status: AC
  Filled 2023-01-23: qty 3.5

## 2023-01-23 MED ORDER — 0.9 % SODIUM CHLORIDE (POUR BTL) OPTIME
TOPICAL | Status: DC | PRN
  Administered 2023-01-23: 5000 mL

## 2023-01-23 MED ORDER — CALCIUM CHLORIDE 10 % IV SOLN
INTRAVENOUS | Status: AC
  Filled 2023-01-23: qty 10

## 2023-01-23 MED ORDER — METOPROLOL TARTRATE 5 MG/5ML IV SOLN: 2.5000 mg | INTRAVENOUS | Status: DC | PRN

## 2023-01-23 MED ORDER — MILRINONE LACTATE IN DEXTROSE 20-5 MG/100ML-% IV SOLN
0.1250 ug/kg/min | INTRAVENOUS | Status: DC
  Administered 2023-01-23 – 2023-01-24 (×2): 0.25 ug/kg/min via INTRAVENOUS
  Administered 2023-01-25: 0.125 ug/kg/min via INTRAVENOUS
  Filled 2023-01-23 (×3): qty 100

## 2023-01-23 MED ORDER — PANTOPRAZOLE SODIUM 40 MG PO TBEC: 40.0000 mg | DELAYED_RELEASE_TABLET | Freq: Every day | ORAL | Status: DC

## 2023-01-23 MED ORDER — ASPIRIN 325 MG PO TBEC
325.0000 mg | DELAYED_RELEASE_TABLET | Freq: Every day | ORAL | Status: DC
  Administered 2023-01-24 – 2023-01-26 (×3): 325 mg via ORAL
  Filled 2023-01-23 (×3): qty 1

## 2023-01-23 MED ORDER — HEPARIN SODIUM (PORCINE) 1000 UNIT/ML IJ SOLN
INTRAMUSCULAR | Status: AC
  Filled 2023-01-23: qty 10

## 2023-01-23 MED ORDER — EPINEPHRINE HCL 5 MG/250ML IV SOLN IN NS
0.0000 ug/min | INTRAVENOUS | Status: DC
  Filled 2023-01-23: qty 250

## 2023-01-23 SURGICAL SUPPLY — 127 items
ADAPTER CARDIO PERF ANTE/RETRO (ADAPTER) ×2 IMPLANT
BAG DECANTER FOR FLEXI CONT (MISCELLANEOUS) ×2 IMPLANT
BLADE CLIPPER SURG (BLADE) ×2 IMPLANT
BLADE STERNUM SYSTEM 6 (BLADE) ×2 IMPLANT
BLADE SURG 15 STRL LF DISP TIS (BLADE) ×2 IMPLANT
BLADE SURG 15 STRL SS (BLADE) ×2
BNDG ELASTIC 4X5.8 VLCR STR LF (GAUZE/BANDAGES/DRESSINGS) ×2 IMPLANT
BNDG ELASTIC 6X10 VLCR STRL LF (GAUZE/BANDAGES/DRESSINGS) IMPLANT
BNDG ELASTIC 6X5.8 VLCR STR LF (GAUZE/BANDAGES/DRESSINGS) ×2 IMPLANT
BNDG GAUZE DERMACEA FLUFF 4 (GAUZE/BANDAGES/DRESSINGS) ×2 IMPLANT
CANISTER SUCT 3000ML PPV (MISCELLANEOUS) ×2 IMPLANT
CANNULA ARTERIAL VENT 3/8 20FR (CANNULA) IMPLANT
CANNULA GUNDRY RCSP 15FR (MISCELLANEOUS) ×2 IMPLANT
CANNULA GUNDRY RETROGRADE 15FR (MISCELLANEOUS) IMPLANT
CANNULA MC2 2 STG 36/46 NON-V (CANNULA) IMPLANT
CANNULA VENOUS 2 STG 34/46 (CANNULA) ×2
CATH HEART VENT LEFT (CATHETERS) ×2 IMPLANT
CATH ROBINSON RED A/P 18FR (CATHETERS) ×6 IMPLANT
CATH THORACIC 28FR (CATHETERS) ×2 IMPLANT
CATH THORACIC 36FR (CATHETERS) ×2 IMPLANT
CATH THORACIC 36FR RT ANG (CATHETERS) ×2 IMPLANT
CLIP VESOCCLUDE MED 24/CT (CLIP) IMPLANT
CLIP VESOCCLUDE SM WIDE 24/CT (CLIP) IMPLANT
CNTNR URN SCR LID CUP LEK RST (MISCELLANEOUS) ×2 IMPLANT
CONT SPEC 4OZ STRL OR WHT (MISCELLANEOUS) ×2
CONTAINER PROTECT SURGISLUSH (MISCELLANEOUS) ×4 IMPLANT
COUNTER NEEDLE 20 DBL MAG RED (NEEDLE) IMPLANT
COVER SURGICAL LIGHT HANDLE (MISCELLANEOUS) ×2 IMPLANT
DEVICE SUT CK QUICK LOAD INDV (Prosthesis & Implant Heart) IMPLANT
DEVICE SUT CK QUICK LOAD MINI (Prosthesis & Implant Heart) IMPLANT
DRAPE CARDIOVASCULAR INCISE (DRAPES) ×2
DRAPE SRG 135X102X78XABS (DRAPES) ×2 IMPLANT
DRAPE WARM FLUID 44X44 (DRAPES) ×2 IMPLANT
DRSG COVADERM 4X14 (GAUZE/BANDAGES/DRESSINGS) ×2 IMPLANT
ELECT CAUTERY BLADE 6.4 (BLADE) ×2 IMPLANT
ELECT REM PT RETURN 9FT ADLT (ELECTROSURGICAL) ×4
ELECTRODE REM PT RTRN 9FT ADLT (ELECTROSURGICAL) ×4 IMPLANT
FELT TEFLON 1X6 (MISCELLANEOUS) ×4 IMPLANT
GAUZE 4X4 16PLY ~~LOC~~+RFID DBL (SPONGE) ×2 IMPLANT
GAUZE SPONGE 4X4 12PLY STRL (GAUZE/BANDAGES/DRESSINGS) ×4 IMPLANT
GLOVE BIO SURGEON STRL SZ 6 (GLOVE) IMPLANT
GLOVE BIO SURGEON STRL SZ 6.5 (GLOVE) IMPLANT
GLOVE BIO SURGEON STRL SZ7 (GLOVE) IMPLANT
GLOVE BIO SURGEON STRL SZ7.5 (GLOVE) IMPLANT
GLOVE BIOGEL PI IND STRL 6 (GLOVE) IMPLANT
GLOVE BIOGEL PI IND STRL 6.5 (GLOVE) IMPLANT
GLOVE BIOGEL PI IND STRL 7.0 (GLOVE) IMPLANT
GLOVE ORTHO TXT STRL SZ7.5 (GLOVE) IMPLANT
GLOVE SURG MICRO LTX SZ7 (GLOVE) ×4 IMPLANT
GLOVE SURG SS PI 7.5 STRL IVOR (GLOVE) IMPLANT
GOWN STRL REUS W/ TWL LRG LVL3 (GOWN DISPOSABLE) ×8 IMPLANT
GOWN STRL REUS W/ TWL XL LVL3 (GOWN DISPOSABLE) ×2 IMPLANT
GOWN STRL REUS W/TWL LRG LVL3 (GOWN DISPOSABLE) ×8
GOWN STRL REUS W/TWL XL LVL3 (GOWN DISPOSABLE) ×14
HEMOSTAT POWDER SURGIFOAM 1G (HEMOSTASIS) ×6 IMPLANT
HEMOSTAT SURGICEL 2X14 (HEMOSTASIS) ×2 IMPLANT
INSERT FOGARTY 61MM (MISCELLANEOUS) IMPLANT
INSERT FOGARTY XLG (MISCELLANEOUS) IMPLANT
KIT BASIN OR (CUSTOM PROCEDURE TRAY) ×2 IMPLANT
KIT CATH CPB BARTLE (MISCELLANEOUS) ×2 IMPLANT
KIT SUCTION CATH 14FR (SUCTIONS) ×2 IMPLANT
KIT SUT CK MINI COMBO 4X17 (Prosthesis & Implant Heart) IMPLANT
KIT TURNOVER KIT B (KITS) ×2 IMPLANT
KIT VASOVIEW HEMOPRO 2 VH 4000 (KITS) ×2 IMPLANT
LINE VENT (MISCELLANEOUS) IMPLANT
NS IRRIG 1000ML POUR BTL (IV SOLUTION) ×12 IMPLANT
PACK E OPEN HEART (SUTURE) ×2 IMPLANT
PACK OPEN HEART (CUSTOM PROCEDURE TRAY) ×2 IMPLANT
PAD ARMBOARD 7.5X6 YLW CONV (MISCELLANEOUS) ×4 IMPLANT
PAD ELECT DEFIB RADIOL ZOLL (MISCELLANEOUS) ×2 IMPLANT
PENCIL BUTTON HOLSTER BLD 10FT (ELECTRODE) ×2 IMPLANT
POSITIONER HEAD DONUT 9IN (MISCELLANEOUS) ×2 IMPLANT
PUNCH AORTIC ROTATE 4.0MM (MISCELLANEOUS) IMPLANT
PUNCH AORTIC ROTATE 4.5MM 8IN (MISCELLANEOUS) ×2 IMPLANT
PUNCH AORTIC ROTATE 5MM 8IN (MISCELLANEOUS) IMPLANT
SET CARDIO DLP MULTI-PER 6-LEG (CARDIAC RHYTHM DISPOSABLE) IMPLANT
SET MPS 3-ND DEL (MISCELLANEOUS) IMPLANT
SPONGE INTESTINAL PEANUT (DISPOSABLE) IMPLANT
SPONGE T-LAP 18X18 ~~LOC~~+RFID (SPONGE) ×8 IMPLANT
SPONGE T-LAP 4X18 ~~LOC~~+RFID (SPONGE) ×4 IMPLANT
SUPPORT HEART JANKE-BARRON (MISCELLANEOUS) ×2 IMPLANT
SUT BONE WAX W31G (SUTURE) ×2 IMPLANT
SUT EB EXC GRN/WHT 2-0 D/A SH (SUTURE) ×4
SUT EB EXC GRN/WHT 2-0 V-5 (SUTURE) ×4 IMPLANT
SUT ETHIBON EXCEL 2-0 V-5 (SUTURE) IMPLANT
SUT ETHIBOND 2 0 SH (SUTURE) ×2
SUT ETHIBOND 2 0 SH 36X2 (SUTURE) IMPLANT
SUT ETHIBOND V-5 VALVE (SUTURE) IMPLANT
SUT MNCRL AB 4-0 PS2 18 (SUTURE) IMPLANT
SUT PROLENE 3 0 SH DA (SUTURE) IMPLANT
SUT PROLENE 3 0 SH1 36 (SUTURE) ×2 IMPLANT
SUT PROLENE 4 0 RB 1 (SUTURE) ×8
SUT PROLENE 4 0 SH DA (SUTURE) IMPLANT
SUT PROLENE 4-0 RB1 .5 CRCL 36 (SUTURE) ×6 IMPLANT
SUT PROLENE 5 0 C 1 36 (SUTURE) IMPLANT
SUT PROLENE 6 0 C 1 30 (SUTURE) IMPLANT
SUT PROLENE 7 0 BV 1 (SUTURE) IMPLANT
SUT PROLENE 7 0 BV1 MDA (SUTURE) ×2 IMPLANT
SUT PROLENE 8 0 BV175 6 (SUTURE) IMPLANT
SUT SILK  1 MH (SUTURE)
SUT SILK 1 MH (SUTURE) IMPLANT
SUT SILK 2 0 SH (SUTURE) IMPLANT
SUT STEEL 6MS V (SUTURE) IMPLANT
SUT STEEL STERNAL CCS#1 18IN (SUTURE) IMPLANT
SUT STEEL SZ 6 DBL 3X14 BALL (SUTURE) IMPLANT
SUT VIC AB 1 CTX 36 (SUTURE) ×4
SUT VIC AB 1 CTX36XBRD ANBCTR (SUTURE) ×4 IMPLANT
SUT VIC AB 2-0 CT1 27 (SUTURE) ×2
SUT VIC AB 2-0 CT1 TAPERPNT 27 (SUTURE) IMPLANT
SUT VIC AB 2-0 CTX 27 (SUTURE) IMPLANT
SUT VIC AB 3-0 SH 27 (SUTURE)
SUT VIC AB 3-0 SH 27X BRD (SUTURE) IMPLANT
SUT VIC AB 3-0 X1 27 (SUTURE) IMPLANT
SUT VICRYL 4-0 PS2 18IN ABS (SUTURE) IMPLANT
SUTURE EB EXC GRN/WHT 2-0 D/A (SUTURE) IMPLANT
SYSTEM SAHARA CHEST DRAIN ATS (WOUND CARE) ×2 IMPLANT
TAPE CLOTH SURG 4X10 WHT LF (GAUZE/BANDAGES/DRESSINGS) IMPLANT
TAPE PAPER 2X10 WHT MICROPORE (GAUZE/BANDAGES/DRESSINGS) IMPLANT
TOWEL GREEN STERILE (TOWEL DISPOSABLE) ×2 IMPLANT
TOWEL GREEN STERILE FF (TOWEL DISPOSABLE) ×2 IMPLANT
TRAY FOLEY SLVR 16FR TEMP STAT (SET/KITS/TRAYS/PACK) ×2 IMPLANT
TUBE SUCTION CARDIAC 10FR (CANNULA) IMPLANT
TUBING LAP HI FLOW INSUFFLATIO (TUBING) ×2 IMPLANT
UNDERPAD 30X36 HEAVY ABSORB (UNDERPADS AND DIAPERS) ×2 IMPLANT
VALVE AORTIC SZ27 INSP/RESIL (Valve) IMPLANT
VENT LEFT HEART 12002 (CATHETERS) ×2
WATER STERILE IRR 1000ML POUR (IV SOLUTION) ×4 IMPLANT

## 2023-01-23 NOTE — Discharge Instructions (Signed)

## 2023-01-23 NOTE — Anesthesia Procedure Notes (Signed)
Procedure Name: Intubation Date/Time: 01/23/2023 7:47 AM  Performed by: Wilburn Cornelia, CRNAPre-anesthesia Checklist: Patient identified, Emergency Drugs available, Suction available and Patient being monitored Patient Re-evaluated:Patient Re-evaluated prior to induction Oxygen Delivery Method: Circle System Utilized Preoxygenation: Pre-oxygenation with 100% oxygen Induction Type: IV induction Ventilation: Mask ventilation without difficulty and Two handed mask ventilation required Laryngoscope Size: Mac and 4 Grade View: Grade I Tube type: Oral Tube size: 8.0 mm Number of attempts: 1 Airway Equipment and Method: Stylet and Oral airway Placement Confirmation: ETT inserted through vocal cords under direct vision, positive ETCO2 and breath sounds checked- equal and bilateral Secured at: 24 cm Tube secured with: Tape Dental Injury: Teeth and Oropharynx as per pre-operative assessment  Comments: Placed by Dolphus Jenny SRNA

## 2023-01-23 NOTE — Consult Note (Addendum)
Advanced Heart Failure Team Consult Note   Primary Physician: Caryl Bis, MD PCP-Cardiologist:  None  Reason for Consultation: Management of Systolic Heart Failure  HPI:    Raymond Roberson is seen today for management of systolic heart failure, post CABG + AVR, at the request of Dr. Cyndia Bent, [facility].   67 y/o male w/ HTN, T2DM, prior TIA and tobacco use. Prior echo in 2014 showed normal LVEF, 55-60%, and bicuspid AoV w/o significant stenosis at the time.   Presented to ED 1/26 w/ several month h/o progressive exertional dyspnea and CP. Acutely worse day of presentation. Ruled in for NSTEMI, Hs trop peaked at 22,436. 2D Echo showed biventricular dysfunction and mod-severe bicuspid aortic stenosis, LVEF 30-35%, RV mildly reduced, mean AoV gradient 34 mmHg. Cardiac cath showed 2V CAD w/ large dominant RCA, occluded at the ostium w/ L>R collaterals (felt CTO) and high grade calcified prox LAD stenosis. RHC showed mildly elevated filling pressures and preserved output w/ CI 2.53 L/min/m .  After discussion between IC, structural interventionalist and CT surgery, pt felt best suited for CABG/SAVR. Taken to OR today, underwent successful CABG x 1 w/ LIMA-LAD + tissue AoV replacement. No graft to the RCA as vessel is diffusely calcified out to the distal PDA. There was no area in the RCA system that was soft enough to open for grafting.  Intraoperative TEE post valve replacement showed normally functioning aortic valve prosthesis with a low mean gradient of 4 mm Hg and no paravalvular leak. LVEF ~30%, RV mildly reduced.   Pt just returned from OR. On Epi 3, Milrinone 0.25 and NE 8.   [place] #s   PAP 21/17 (19) CI 2.09 CO 4.19  No CVP set up currently   MAP 63   Post Op Labs: WBC 22, Hgb 11.5, Na 137, Scr 0.60, K 3.6  Intubated and sedated. Good UOP.    Pre- CABG RHC Hemodynamics  1: Right atrial pressure-9/8 2: Right ventricular pressure-40/0 3: Pulmonary artery  pressure-38/10, mean 24 4: Pulmonary wedge pressure-A-wave 26, V wave 26, mean 17 5: Cardiac output-5.15 L/min with an index of 2.53 L/min/m  Echo 01/20/23  IMPRESSIONS     1. Calcified aortic valve with inderminant number of cusps; probable  severe AS with mean gradient as high as 37 mmHg; mild AI.   2. Left ventricular ejection fraction, by estimation, is 30 to 35%. The  left ventricle has moderate to severely decreased function. The left  ventricle demonstrates global hypokinesis. The left ventricular internal  cavity size was moderately dilated.  Left ventricular diastolic parameters are consistent with Grade III  diastolic dysfunction (restrictive).   3. Right ventricular systolic function is mildly reduced. The right  ventricular size is normal.   4. The mitral valve is degenerative. Mild mitral valve regurgitation. No  evidence of mitral stenosis.   5. The aortic valve is calcified. Aortic valve regurgitation is mild.  Severe aortic valve stenosis. Aortic regurgitation PHT measures 220 msec.  Aortic valve area, by VTI measures 1.43 cm. Aortic valve mean gradient  measures 33.7 mmHg. Aortic valve Vmax   measures 3.63 m/s.   6. The inferior vena cava is dilated in size with >50% respiratory  variability, suggesting right atrial pressure of 8 mmHg.   Review of Systems: Unable to assess currently given pt state (intubated and sedated)  General: Weight gain '[ ]'$ ; Weight loss '[ ]'$ ; Anorexia '[ ]'$ ; Fatigue '[ ]'$ ; Fever '[ ]'$ ; Chills '[ ]'$ ; Weakness '[ ]'$   Cardiac: Chest pain/pressure '[ ]'$ ; Resting SOB '[ ]'$ ; Exertional SOB '[ ]'$ ; Orthopnea '[ ]'$ ; Pedal Edema '[ ]'$ ; Palpitations '[ ]'$ ; Syncope '[ ]'$ ; Presyncope '[ ]'$ ; Paroxysmal nocturnal dyspnea'[ ]'$   Pulmonary: Cough '[ ]'$ ; Wheezing'[ ]'$ ; Hemoptysis'[ ]'$ ; Sputum '[ ]'$ ; Snoring '[ ]'$   GI: Vomiting'[ ]'$ ; Dysphagia'[ ]'$ ; Melena'[ ]'$ ; Hematochezia '[ ]'$ ; Heartburn'[ ]'$ ; Abdominal pain '[ ]'$ ; Constipation '[ ]'$ ; Diarrhea '[ ]'$ ; BRBPR '[ ]'$   GU: Hematuria'[ ]'$ ; Dysuria '[ ]'$ ; Nocturia'[ ]'$    Vascular: Pain in legs with walking '[ ]'$ ; Pain in feet with lying flat '[ ]'$ ; Non-healing sores '[ ]'$ ; Stroke '[ ]'$ ; TIA '[ ]'$ ; Slurred speech '[ ]'$ ;  Neuro: Headaches'[ ]'$ ; Vertigo'[ ]'$ ; Seizures'[ ]'$ ; Paresthesias'[ ]'$ ;Blurred vision '[ ]'$ ; Diplopia '[ ]'$ ; Vision changes '[ ]'$   Ortho/Skin: Arthritis '[ ]'$ ; Joint pain '[ ]'$ ; Muscle pain '[ ]'$ ; Joint swelling '[ ]'$ ; Back Pain '[ ]'$ ; Rash '[ ]'$   Psych: Depression'[ ]'$ ; Anxiety'[ ]'$   Heme: Bleeding problems '[ ]'$ ; Clotting disorders '[ ]'$ ; Anemia '[ ]'$   Endocrine: Diabetes '[ ]'$ ; Thyroid dysfunction'[ ]'$   Home Medications Prior to Admission medications   Medication Sig Start Date End Date Taking? Authorizing Provider  ALPRAZolam Duanne Moron) 0.5 MG tablet Take 0.5 mg by mouth 2 (two) times daily.   Yes [provider]  aspirin 81 MG tablet Take 81 mg by mouth daily.   Yes [provider]  Aspirin-Acetaminophen (GOODYS BODY PAIN PO) Take 1 Package by mouth daily as needed (pain/out of aspirin).   Yes [provider]  atorvastatin (LIPITOR) 20 MG tablet Take 20 mg by mouth daily. 04/27/22  Yes [provider]  citalopram (CELEXA) 40 MG tablet Take 20 mg by mouth daily.   Yes [provider]  esomeprazole (NEXIUM) 40 MG capsule Take 40 mg by mouth daily. 04/27/22  Yes [provider]  HYDROcodone-acetaminophen (NORCO/VICODIN) 5-325 MG tablet Take 1 tablet by mouth 4 (four) times daily.   Yes [provider]  hydrocortisone (ANUSOL-HC) 2.5 % rectal cream Place 1 application  rectally daily as needed for hemorrhoids or anal itching.   Yes [provider]  metFORMIN (GLUCOPHAGE-XR) 500 MG 24 hr tablet Take 500 mg by mouth daily. 05/03/22  Yes [provider]  Multiple Vitamins-Minerals (MULTIVITAMIN WITH MINERALS) tablet Take 1 tablet by mouth daily.   Yes [provider]  ondansetron (ZOFRAN) 8 MG tablet Take 8 mg by mouth once a week. 04/10/22  Yes [provider]  sildenafil (VIAGRA) 100 MG tablet Take 100 mg by  mouth daily as needed for erectile dysfunction. 01/17/23  Yes [provider]  allopurinol (ZYLOPRIM) 300 MG tablet Take 300 mg by mouth daily. Patient not taking: Reported on 01/21/2023    [provider]  cycloSPORINE (RESTASIS) 0.05 % ophthalmic emulsion Place 1 drop into both eyes daily. Patient not taking: Reported on 01/21/2023    [provider]  levocetirizine (XYZAL) 5 MG tablet Take 5 mg by mouth daily. Patient not taking: Reported on 01/21/2023 04/27/22   [provider]    Past Medical History: Past Medical History:  Diagnosis Date   Acute angina ([place])    Bicuspid aortic valve    Chronic alcohol abuse    Classic migraine 09/02/2013   CVA (cerebral vascular accident) ([place])    Diabetes mellitus without complication ([place])    Dizziness and giddiness 09/02/2013   GERD (gastroesophageal reflux disease)    Gouty arthritis    Hypertension    Major depression  Other and unspecified hyperlipidemia    Peptic ulcer disease    Sleep apnea     Past Surgical History: Past Surgical History:  Procedure Laterality Date   BIOPSY  07/21/2022   Procedure: BIOPSY;  Surgeon: Harvel Quale, MD;  [facility]: [facility];  Service: Gastroenterology;;   CIRCUMCISION     COLONOSCOPY WITH PROPOFOL N/A 07/21/2022   Procedure: COLONOSCOPY WITH PROPOFOL;  Surgeon: Harvel Quale, MD;  Location: [facility];  Service: Gastroenterology;  Laterality: N/A;  11:15 ASA 2   CYST REMOVAL TRUNK  11/24/2012   back   INGUINAL HERNIA REPAIR Bilateral    POLYPECTOMY  07/21/2022   Procedure: POLYPECTOMY;  Surgeon: Harvel Quale, MD;  Location: [facility];  Service: Gastroenterology;;   RHINOPLASTY     RIGHT HEART CATH AND CORONARY ANGIOGRAPHY N/A 01/22/2023   Procedure: RIGHT HEART CATH AND CORONARY ANGIOGRAPHY;  Surgeon: Lorretta Harp, MD;  Location: [facility];  Service: Cardiovascular;  Laterality: N/A;    Family  History: Family History  Problem Relation Age of Onset   Allergies Mother    Anxiety disorder Mother    CAD Father    Heart failure Father     Social History: Social History   Socioeconomic History   Marital status: Widowed    Spouse name: Not on file   Number of children: 2   Years of education: 9th   Highest education level: Not on file  Occupational History   Occupation: Retired Administrator  Tobacco Use   Smoking status: Never   Smokeless tobacco: Current    Types: Snuff  Substance and Sexual Activity   Alcohol use: Not Currently   Drug use: No   Sexual activity: Not on file  Other Topics Concern   Not on file  Social History Narrative   Not on file   Social Determinants of Health   Financial Resource Strain: Not on file  Food Insecurity: No Food Insecurity (01/20/2023)   Hunger Vital Sign    Worried About Running Out of Food in the Last Year: Never true    Ran Out of Food in the Last Year: Never true  Transportation Needs: No Transportation Needs (01/20/2023)   PRAPARE - Hydrologist (Medical): No    Lack of Transportation (Non-Medical): No  Physical Activity: Not on file  Stress: Not on file  Social Connections: Not on file    Allergies:  Allergies  Allergen Reactions   Naproxen Nausea Only   Sulfa Antibiotics Nausea Only    Objective:    Vital Signs:   Temp:  [96.8 F (36 C)-98.3 F (36.8 C)] 96.8 F (36 C) (01/30 1345) Pulse Rate:  [65-80] 80 (01/30 1314) Resp:  [12-20] 12 (01/30 1314) BP: (104-119)/(53-73) 104/62 (01/30 1340) SpO2:  [97 %-100 %] 97 % (01/30 1314) Arterial Line BP: (122)/(61) 122/61 (01/30 1345) FiO2 (%):  [50 %] 50 % (01/30 1340) Weight:  [78.4 kg] 78.4 kg (01/30 0622) Last BM Date : 01/22/23  Weight change: Filed Weights   01/21/23 0651 01/22/23 0549 01/23/23 0622  Weight: 81.7 kg 79.9 kg 78.4 kg    Intake/Output:   Intake/Output Summary (Last 24 hours) at 01/23/2023 1405 Last data  filed at 01/23/2023 1252 Gross per 24 hour  Intake 3005 ml  Output 2417 ml  Net 588 ml      Physical Exam    General:  intubated and sedated  HEENT: normal + ETT  Neck: supple. JVP 10 cm . Carotids 2+ bilat; no bruits. No lymphadenopathy or thyromegaly appreciated. Cor: PMI nondisplaced. Regular rate & rhythm. No rubs, gallops or murmurs. + sternal dressing, + CTs  Lungs: intubated and clear Abdomen: soft, nontender, nondistended. No hepatosplenomegaly. No bruits or masses. Good bowel sounds. Extremities: no cyanosis, clubbing, rash, trace b/l LE edema Neuro: intubated and sedated  GU: + foley, clear urine    Telemetry   A-V paced 80s   EKG    NSR 62 bpm, LVH   Labs   Basic Metabolic Panel: Recent Labs  Lab 01/19/23 1543 01/20/23 0114 01/21/23 0054 01/22/23 0154 01/22/23 8088 01/23/23 1103 01/23/23 0909 01/23/23 0938 01/23/23 0942 01/23/23 1002 01/23/23 1138 01/23/23 1207  NA 137 138 137 133*   < > 138 138 138 139 136 136 137  K 3.4* 3.6 3.6 4.1   < > 3.6 3.6 3.7 3.6 4.4 4.6 3.6  CL 104 103 102 99  --  98 101  --   --  99  --  98  CO2 '24 27 27 25  '$ --   --   --   --   --   --   --   --   GLUCOSE 144* 101* 122* 112*  --  105* 94  --   --  101*  --  182*  BUN '8 9 10 11  '$ --  9 8  --   --  9  --  9  CREATININE 0.92 0.94 0.95 0.98  --  0.60* 0.50*  --   --  0.60*  --  0.60*  CALCIUM 9.1 8.9 8.9 8.6*  --   --   --   --   --   --   --   --   MG  --   --   --  2.4  --   --   --   --   --   --   --   --   PHOS  --   --   --  4.1  --   --   --   --   --   --   --   --    < > = values in this interval not displayed.    Liver Function Tests: Recent Labs  Lab 01/19/23 1543  AST 34  ALT 20  ALKPHOS 41  BILITOT 1.0  PROT 6.4*  ALBUMIN 3.3*   No results for input(s): "LIPASE", "AMYLASE" in the last 168 hours. No results for input(s): "AMMONIA" in the last 168 hours.  CBC: Recent Labs  Lab 01/19/23 1543 01/20/23 0114 01/21/23 0054 01/22/23 0154  01/22/23 0812 01/23/23 1002 01/23/23 1109 01/23/23 1138 01/23/23 1207 01/23/23 1328  WBC 8.5 8.1 9.5 9.1  --   --   --   --   --  22.0*  NEUTROABS 6.8  --   --   --   --   --   --   --   --   --   HGB 12.5* 12.4* 12.5* 12.1*   < > 10.5* 10.5* 10.5* 9.5* 11.5*  HCT 38.2* 39.0 37.6* 37.8*   < > 31.0* 31.4* 31.0* 28.0* 34.1*  MCV 84.7 85.7 85.1 85.5  --   --   --   --   --  84.0  PLT 310 315 308 303  --   --  311  --   --  325   < > = values  in this interval not displayed.    Cardiac Enzymes: No results for input(s): "CKTOTAL", "CKMB", "CKMBINDEX", "TROPONINI" in the last 168 hours.  BNP: BNP (last 3 results) Recent Labs    01/19/23 1543  BNP 897.6*    ProBNP (last 3 results) No results for input(s): "PROBNP" in the last 8760 hours.   CBG: Recent Labs  Lab 01/22/23 1607 01/22/23 2047 01/23/23 0623 01/23/23 1328 01/23/23 1358  GLUCAP 90 85 89 129* 130*    Coagulation Studies: No results for input(s): "LABPROT", "INR" in the last 72 hours.   Imaging   EP STUDY  Result Date: 01/23/2023 See surgical note for result.  CT ANGIO CHEST AORTA W/CM & OR WO/CM  Result Date: 01/23/2023 CLINICAL DATA:  Preop evaluation for aortic valve replacement EXAM: CT ANGIOGRAPHY CHEST, ABDOMEN AND PELVIS TECHNIQUE: Non-contrast CT of the chest was initially obtained. Multidetector CT imaging through the chest, abdomen and pelvis was performed using the standard protocol during bolus administration of intravenous contrast. Multiplanar reconstructed images and MIPs were obtained and reviewed to evaluate the vascular anatomy. RADIATION DOSE REDUCTION: This exam was performed according to the departmental dose-optimization program which includes automated exposure control, adjustment of the mA and/or kV according to patient size and/or use of iterative reconstruction technique. CONTRAST:  115m OMNIPAQUE IOHEXOL 350 MG/ML SOLN COMPARISON:  None Available. FINDINGS: CTA CHEST FINDINGS  Cardiovascular: Normal heart size. No pericardial effusion. Aortic valve thickening and calcifications. Normal caliber thoracic aorta with mild atherosclerotic disease. No suspicious filling defects of the central pulmonary arteries. Left main and three-vessel coronary artery calcifications Mediastinum/Nodes: Esophagus and thyroid are unremarkable. No pathologically enlarged lymph nodes seen in the chest. Lungs/Pleura: Central airways are patent. Small bilateral pleural effusions and bibasilar atelectasis. No consolidation, pleural effusion or pneumothorax. Musculoskeletal: No chest wall abnormality. No acute or significant osseous findings. CTA ABDOMEN AND PELVIS FINDINGS Hepatobiliary: No focal liver abnormality is seen. No gallstones, gallbladder wall thickening, or biliary dilatation. Pancreas: Unremarkable. No pancreatic ductal dilatation or surrounding inflammatory changes. Spleen: Normal in size without focal abnormality. Adrenals/Urinary Tract: Bilateral adrenal glands are unremarkable. No hydronephrosis or nephrolithiasis. Bilateral low-attenuation renal lesions, largest are compatible with simple cysts, others are too small to completely characterize. Bladder is unremarkable. Stomach/Bowel: Stomach is within normal limits. Appendix appears normal. No evidence of bowel wall thickening, distention, or inflammatory changes. Vascular/lymphatic: Normal caliber abdominal aorta with moderate calcified and noncalcified plaque. No significant stenosis. No pathologically enlarged lymph nodes seen in the chest. Reproductive: Mild prostatomegaly. Other: No abdominal wall hernia or abnormality. No abdominopelvic ascites. Musculoskeletal: No acute or significant osseous findings. VASCULAR MEASUREMENTS PERTINENT TO TAVR: AORTA: Minimal Aortic Diameter -  14.7 mm Severity of Aortic Calcification-moderate RIGHT PELVIS: Right Common Iliac Artery - Minimal Diameter-10.0 mm Tortuosity-moderate Calcification-mild Right  External Iliac Artery - Minimal Diameter-8.2 mm Tortuosity-moderate Calcification-none Right Common Femoral Artery - Minimal Diameter-7.6 mm Tortuosity-none Calcification-mild LEFT PELVIS: Left Common Iliac Artery - Minimal Diameter-9.5 mm Tortuosity-moderate Calcification-none Left External Iliac Artery - Minimal Diameter-7.7 mm Tortuosity-mild Calcification-none Left Common Femoral Artery - Minimal Diameter-7.2 mm Tortuosity-none Calcification-none Review of the MIP images confirms the above findings. IMPRESSION: 1. Vascular findings and measurements pertinent to potential TAVR procedure, as detailed above. 2. Thickening and calcification of the aortic valve, compatible with reported clinical history of aortic stenosis. 3. Moderate mild aortoiliac atherosclerosis. Left main and 3 vessel coronary artery disease. 4. Small bilateral pleural effusions and bibasilar atelectasis. Electronically Signed   By: LYetta GlassmanM.D.   On:  01/23/2023 12:05   CT Angio Abd/Pel w/ and/or w/o  Result Date: 01/23/2023 CLINICAL DATA:  Preop evaluation for aortic valve replacement EXAM: CT ANGIOGRAPHY CHEST, ABDOMEN AND PELVIS TECHNIQUE: Non-contrast CT of the chest was initially obtained. Multidetector CT imaging through the chest, abdomen and pelvis was performed using the standard protocol during bolus administration of intravenous contrast. Multiplanar reconstructed images and MIPs were obtained and reviewed to evaluate the vascular anatomy. RADIATION DOSE REDUCTION: This exam was performed according to the departmental dose-optimization program which includes automated exposure control, adjustment of the mA and/or kV according to patient size and/or use of iterative reconstruction technique. CONTRAST:  129m OMNIPAQUE IOHEXOL 350 MG/ML SOLN COMPARISON:  None Available. FINDINGS: CTA CHEST FINDINGS Cardiovascular: Normal heart size. No pericardial effusion. Aortic valve thickening and calcifications. Normal caliber thoracic  aorta with mild atherosclerotic disease. No suspicious filling defects of the central pulmonary arteries. Left main and three-vessel coronary artery calcifications Mediastinum/Nodes: Esophagus and thyroid are unremarkable. No pathologically enlarged lymph nodes seen in the chest. Lungs/Pleura: Central airways are patent. Small bilateral pleural effusions and bibasilar atelectasis. No consolidation, pleural effusion or pneumothorax. Musculoskeletal: No chest wall abnormality. No acute or significant osseous findings. CTA ABDOMEN AND PELVIS FINDINGS Hepatobiliary: No focal liver abnormality is seen. No gallstones, gallbladder wall thickening, or biliary dilatation. Pancreas: Unremarkable. No pancreatic ductal dilatation or surrounding inflammatory changes. Spleen: Normal in size without focal abnormality. Adrenals/Urinary Tract: Bilateral adrenal glands are unremarkable. No hydronephrosis or nephrolithiasis. Bilateral low-attenuation renal lesions, largest are compatible with simple cysts, others are too small to completely characterize. Bladder is unremarkable. Stomach/Bowel: Stomach is within normal limits. Appendix appears normal. No evidence of bowel wall thickening, distention, or inflammatory changes. Vascular/lymphatic: Normal caliber abdominal aorta with moderate calcified and noncalcified plaque. No significant stenosis. No pathologically enlarged lymph nodes seen in the chest. Reproductive: Mild prostatomegaly. Other: No abdominal wall hernia or abnormality. No abdominopelvic ascites. Musculoskeletal: No acute or significant osseous findings. VASCULAR MEASUREMENTS PERTINENT TO TAVR: AORTA: Minimal Aortic Diameter -  14.7 mm Severity of Aortic Calcification-moderate RIGHT PELVIS: Right Common Iliac Artery - Minimal Diameter-10.0 mm Tortuosity-moderate Calcification-mild Right External Iliac Artery - Minimal Diameter-8.2 mm Tortuosity-moderate Calcification-none Right Common Femoral Artery - Minimal  Diameter-7.6 mm Tortuosity-none Calcification-mild LEFT PELVIS: Left Common Iliac Artery - Minimal Diameter-9.5 mm Tortuosity-moderate Calcification-none Left External Iliac Artery - Minimal Diameter-7.7 mm Tortuosity-mild Calcification-none Left Common Femoral Artery - Minimal Diameter-7.2 mm Tortuosity-none Calcification-none Review of the MIP images confirms the above findings. IMPRESSION: 1. Vascular findings and measurements pertinent to potential TAVR procedure, as detailed above. 2. Thickening and calcification of the aortic valve, compatible with reported clinical history of aortic stenosis. 3. Moderate mild aortoiliac atherosclerosis. Left main and 3 vessel coronary artery disease. 4. Small bilateral pleural effusions and bibasilar atelectasis. Electronically Signed   By: LYetta GlassmanM.D.   On: 01/23/2023 12:05   DG Orthopantogram  Result Date: 01/22/2023 CLINICAL DATA:  Preop for CABG tomorrow. EXAM: ORTHOPANTOGRAM/PANORAMIC COMPARISON:  None Available. FINDINGS: Dental amalgam is identified within the left upper lateral incisor and left upper first molar. Lucency along the right upper first bicuspid is identified which may reflect underlying carie. No signs of periapical abscess. IMPRESSION: Lucency along the right upper first bicuspid is identified which may reflect underlying carie. Electronically Signed   By: TKerby MoorsM.D.   On: 01/22/2023 15:48     Medications:     Current Medications:  [START ON 01/24/2023] acetaminophen  1,000 mg Oral Q6H   Or   [  START ON 01/24/2023] acetaminophen (TYLENOL) oral liquid 160 mg/5 mL  1,000 mg Per Tube Q6H   acetaminophen (TYLENOL) oral liquid 160 mg/5 mL  650 mg Per Tube Once   Or   acetaminophen  650 mg Rectal Once   [START ON 01/24/2023] allopurinol  300 mg Oral Daily   [START ON 01/24/2023] aspirin EC  325 mg Oral Daily   Or   [START ON 01/24/2023] aspirin  324 mg Per Tube Daily   [START ON 01/24/2023] atorvastatin  80 mg Oral Daily    [START ON 01/24/2023] bisacodyl  10 mg Oral Daily   Or   [START ON 01/24/2023] bisacodyl  10 mg Rectal Daily   chlorhexidine  15 mL Mouth/Throat NOW   cycloSPORINE  1 drop Both Eyes Daily   [START ON 01/24/2023] docusate sodium  200 mg Oral Daily   metoprolol tartrate  12.5 mg Oral BID   Or   metoprolol tartrate  12.5 mg Per Tube BID   [START ON 01/25/2023] pantoprazole  40 mg Oral Daily   [START ON 01/24/2023] sodium chloride flush  3 mL Intravenous Q12H    Infusions:  sodium chloride 10 mL/hr at 01/23/23 1325   [START ON 01/24/2023] sodium chloride     sodium chloride     albumin human      ceFAZolin (ANCEF) IV     dexmedetomidine (PRECEDEX) IV infusion 0.7 mcg/kg/hr (01/23/23 1307)   epinephrine 3 mcg/min (01/23/23 1307)   famotidine (PEPCID) IV 20 mg (01/23/23 1338)   insulin 6 Units/hr (01/23/23 1307)   lactated ringers     lactated ringers     lactated ringers 100 mL/hr at 01/23/23 1307   magnesium sulfate 4 g (01/23/23 1329)   milrinone 0.25 mcg/kg/min (01/23/23 1307)   nitroGLYCERIN Stopped (01/23/23 1324)   norepinephrine (LEVOPHED) Adult infusion 10 mcg/min (01/23/23 1307)   phenylephrine (NEO-SYNEPHRINE) Adult infusion Stopped (01/23/23 1331)   potassium chloride 10 mEq (01/23/23 1335)   vancomycin        Patient Profile   67 y/o male w/ bicuspid AoV, HTN, T2DM and prior TIA admitted w/ NSTEMI. Found to have systolic heart failure, 2V CAD and severe AS. Now s/p CABG + SAVR. AHF team asked to assist w/ post op management of systolic heart failure.   Assessment/Plan   1. CAD/ NSTEMI  - LHC w/ 2V CAD s/p CABG  High grade pLAD>>LIMA-LAD CTO of RCA w/ L>R collaterals, too calcified for grafting  ASA + statin   2. Severe Bicuspid AoV Stenosis  - s/p SAVR w/ tissue valve - intraoperative TEE post replacement w/ normal functioning prosthesis, mean gradient 4 mmHg, no perivalvular leak   3. Systolic Heart Failure  - Echo 2014 w/ normal LVEF, 55-60% - Echo this admit  EF 30-35%, RV mildly reduced. In setting of CAD and severe AS, s/p CABG + AVR - continue post-op inotrope and pressor support, currently on Milrinone 0.25 + NE 8 + Epi 3. CI 2.1 - wean support as tolerated. Follow swan hemodynamics, CVPs and co-ox - AHF team will follow along closely and initiate GDMT once off pressors - start diuresis, if needed, in the next 1-2 days    Length of Stay: [id number], PA-C  01/23/2023, 2:05 PM  Advanced Heart Failure Team Pager [phone] (M-F; 7a - 5p)  Please contact [facility] for night-coverage after hours (4p -7a ) and weekends on [email or web address]  Patient seen with PA, agree with the above note.   He was  admitted with NSTEMI, found to have ischemic cardiomyopathy with EF 30-35%, 2V CAD, and severe AS.  Now s/p CABG-AVR.    He is stable in ICU on NE 8, milrinone 0.25, epinephrine 3.  CI 2.65 with co-ox 72%.  He is making urine.   General: On vent Neck: No JVD, no thyromegaly or thyroid nodule.  Lungs: Clear to auscultation bilaterally with normal respiratory effort. CV: s/p sternotomy  Heart regular S1/S2, no S3/S4, no murmur.  No peripheral edema.  No carotid bruit.  Normal pedal pulses.  Abdomen: Soft, nontender, no hepatosplenomegaly, no distention.  Skin: Intact without lesions or rashes.  Neurologic: Awake on vent Extremities: No clubbing or cyanosis.  HEENT: Normal.   1. CAD: s/p NSTEMI. Cath with CTO RCA with collaterals from left + high grade pLAD.  Now s/p CABG with LIMA-LAD.  Unable to graft RCA as too calcified.  - Continue ASA - Continue statin.  2. Aortic stenosis: Severe bicuspid AS.  Normal caliber thoracic aorta.  Now /sp bioprosthetic AVR.   3. Cardiogenic shock: Ischemic cardiomyopathy, pre-op echo with EF 30-35%.  He is now s/p LIMA-LAD + bioprosthetic AVR.  He is currently on NE 8, milrinone 0.25, epinephrine 3.  CI 2.65 with co-ox 72%.  He is making urine.  - Continue current pressors.  - Wean NE as able.  - Set up  CVP monitoring, will assess for diuretic need tomorrow.   Loralie Champagne 01/23/2023 5:17 PM

## 2023-01-23 NOTE — Anesthesia Procedure Notes (Signed)
Central Venous Catheter Insertion Performed by: Albertha Ghee, MD, anesthesiologist Start/End1/30/2024 7:02 AM, 01/23/2023 7:15 AM Patient location: Pre-op. Preanesthetic checklist: patient identified, IV checked, site marked, risks and benefits discussed, surgical consent, monitors and equipment checked, pre-op evaluation, timeout performed and anesthesia consent Lidocaine 1% used for infiltration and patient sedated Hand hygiene performed  and maximum sterile barriers used  Catheter size: 9 Fr Sheath introducer Procedure performed using ultrasound guided technique. Ultrasound Notes:anatomy identified, needle tip was noted to be adjacent to the nerve/plexus identified, no ultrasound evidence of intravascular and/or intraneural injection and image(s) printed for medical record Attempts: 1 Following insertion, line sutured and dressing applied. Post procedure assessment: blood return through all ports, free fluid flow and no air  Patient tolerated the procedure well with no immediate complications.

## 2023-01-23 NOTE — Interval H&P Note (Signed)
History and Physical Interval Note:  01/23/2023 6:15 AM  Raymond Roberson  has presented today for surgery, with the diagnosis of CAD SEVERE AS.  The various methods of treatment have been discussed with the patient and family. After consideration of risks, benefits and other options for treatment, the patient has consented to  Procedure(s): CORONARY ARTERY BYPASS GRAFTING (CABG) (N/A) AORTIC VALVE REPLACEMENT (AVR) (N/A) TRANSESOPHAGEAL ECHOCARDIOGRAM (N/A) as a surgical intervention.  The patient's history has been reviewed, patient examined, no change in status, stable for surgery.  I have reviewed the patient's chart and labs.  Questions were answered to the patient's satisfaction.     Gaye Pollack

## 2023-01-23 NOTE — Brief Op Note (Signed)
01/19/2023 - 01/23/2023  11:50 AM  PATIENT:  Raymond Roberson  67 y.o. male  PRE-OPERATIVE DIAGNOSIS:  Coronary Artery Disease, Severe Aortic Stenosis  POST-OPERATIVE DIAGNOSIS:  Coronary Artery Disease, Severe Aortic Stenosis  PROCEDURE:  CORONARY ARTERY BYPASS GRAFTING (CABG) 1 TIMES  USING LEFT INTERNAL MAMMARY ARTERY,  RIGHT SAPHENOUS LEG VEIN HARVESTED ENDOSCOPICALLY   LIMA->LAD PDA severely diseased and calcified, was not at graftable vessel.  AORTIC VALVE REPLACEMENT (AVR) USING 27 INSPIRIS VALVE   TRANSESOPHAGEAL ECHOCARDIOGRAM  Vein harvest time: 42mn Vein prep time: 139m  SURGEON:  BaGaye PollackMD - Primary  PHYSICIAN ASSISTANT: Minnie Shi  ASSISTANTS: HaBuzzy HanRN, Circulator  IrTalbert NanaRaylene MiyamotoRN, RN First Assistant   ANESTHESIA:   general  EBL: 150ML  BLOOD ADMINISTERED:none  DRAINS: Mediastinal drains  LOCAL MEDICATIONS USED:  NONE  SPECIMEN:  aortic valve leaflets  DISPOSITION OF SPECIMEN:  PATHOLOGY  COUNTS:  YES  DICTATION: .Dragon Dictation  PLAN OF CARE: Admit to inpatient   PATIENT DISPOSITION:  ICU - intubated and hemodynamically stable.   Delay start of Pharmacological VTE agent (>24hrs) due to surgical blood loss or risk of bleeding: yes

## 2023-01-23 NOTE — Op Note (Signed)
CARDIOVASCULAR SURGERY OPERATIVE NOTE  01/23/2023  Surgeon:  Gaye Pollack, MD  First Assistant: Enid Cutter,  PA-C:   An experienced assistant was required given the complexity of this surgery and the standard of surgical care. The assistant was needed for endoscopic vein harvest, exposure, dissection, suctioning, retraction of delicate tissues and sutures, instrument exchange and for overall help during this procedure.   Preoperative Diagnosis:  Severe multi-vessel coronary artery disease and Severe aortic stenosis.   Postoperative Diagnosis:  Same   Procedure:  Median Sternotomy Extracorporeal circulation 3.   Coronary artery bypass grafting x 1  Left internal mammary artery graft to the LAD   4.   Endoscopic vein harvest from the right leg 5.   Aortic valve replacement using a 27 mm Edwards INSPIRIS RESILIA pericardial valve   Anesthesia:  General Endotracheal   Clinical History/Surgical Indication:  The patient is a 67 year old gentleman who reports a several year history of shortness of breath and chest tightness that has recently progressed to the point that he can't do much activity without giving out. He presented with a hs Trop I of 1436 and a BNP of 898. Echo shows an EF of 30-35% with a severely calcified aortic valve with severe AS. The LV is moderately dilated at 6.2 cm with global hypokinesis and grade 3 DD. Prior echo in 2014 showed an EF of 55-60%. Cath shows occlusion of the ostial RCA with filling of the distal vessel by collat from the LAD which has 90% mid vessel stenosis. Right heart pressures are fairly normal with a PCW pressure of 17 mean. I agree with need for AVR and CABG. He reports a history of 60 lb weight loss over the past 6 months but says he changed his diet and was trying to lose weight to get his DM under better control. He had colonoscopy in July  2023 with removal of several polyps that were benign by path. I discussed the operative procedure with the patient and family including alternatives, benefits and risks; including but not limited to bleeding, blood transfusion, infection, stroke, myocardial infarction, graft failure, heart block requiring a permanent pacemaker, organ dysfunction, and death.  Raymond Roberson understands and agrees to proceed.   Preparation:  The patient was seen in the preoperative holding area and the correct patient, correct operation were confirmed with the patient after reviewing the medical record and catheterization. The consent was signed by me. Preoperative antibiotics were given. A pulmonary arterial line and radial arterial line were placed by the anesthesia team. The patient was taken back to the operating room and positioned supine on the operating room table. After being placed under general endotracheal anesthesia by the anesthesia team a foley catheter was placed. The neck, chest, abdomen, and both legs were prepped with betadine soap and solution and draped in the usual sterile manner. A surgical time-out was taken and the correct patient and operative procedure were confirmed with the nursing and anesthesia staff.   Cardiopulmonary Bypass:  A median sternotomy was performed. The pericardium was opened in the midline. Right ventricular function appeared normal. The ascending aorta was of normal size and had no palpable plaque. There were no contraindications to aortic cannulation or cross-clamping. The patient was fully systemically heparinized and the ACT was maintained > 400 sec. The proximal aortic arch was cannulated with a 20 F aortic cannula for arterial inflow. Venous cannulation was performed via the right atrial appendage using a two-staged venous cannula. An antegrade  cardioplegia/vent cannula was inserted into the mid-ascending aorta. A left ventricular vent was placed via the right superior  pulmonary vein. A retrograde cardioplegia cannula was placed via the right atrium in to the coronary sinus.  Aortic occlusion was performed with a single cross-clamp. Systemic cooling to 32 degrees Centigrade and topical cooling of the heart with iced saline were used. Cold antegrade KBC cardioplegia was used to induce diastolic arrest and was then given retrograde at about 60 minute intervals throughout the period of arrest to maintain myocardial temperature at or below 10 degrees centigrade. A temperature probe was inserted into the interventricular septum and an insulating pad was placed in the pericardium.   Left internal mammary artery harvest:  The left side of the sternum was retracted using the Rultract retractor. The left internal mammary artery was harvested as a pedicle graft. All side branches were clipped. It was a medium-sized vessel of good quality with excellent blood flow. It was ligated distally and divided. It was sprayed with topical papaverine solution to prevent vasospasm.   Endoscopic vein harvest:  The right greater saphenous vein was harvested endoscopically through a 2 cm incision medial to the right knee. It was harvested from the upper thigh to below the knee. It was a medium-sized vein of good quality. The side branches were all ligated with 4-0 silk ties.    Coronary arteries:  The coronary arteries were examined.  LAD:  medium caliber vessel with no distal disease LCX:  no stenosis on cath RCA:  Diffusely calcified extending out to the distal PDA. There was no area in the RCA system that was soft enough to open for grafting. Therefore the GSV was not used.   Grafts:  LIMA to the LAD: 1.6 mm. It was sewn end to side using 8-0 prolene continuous suture.   Aortic Valve Replacement:   A transverse aortotomy was performed 1 cm above the take-off of the right coronary artery. The native valve was a type 1 bicuspid with a single raphe between the left and  non-coronary leaflets with severely calcified leaflets and moderate annular calcification. The ostia of the left coronary arteries were in normal position and was not obstructed. The RCA ostium was occluded with calcified plaque. The native valve leaflets were excised and the annulus was decalcified with rongeurs. Care was taken to remove all particulate debris. The left ventricle was directly inspected for debris and then irrigated with ice saline solution. The annulus was sized and a size 27 mm Edwards INSPIRIS RESILIA pericardial valve was chosen. The model number was [id number] and the serial number was [id number]. 2-0 Ethibond pledgeted horizontal mattress sutures were placed around the annulus with the pledgets in a sub-annular position. The sutures were placed through the sewing ring and the valve lowered into place. The sutures were tied using the CorKnot system.  The valve seated nicely and the coronary ostia were not obstructed by the valve. The prosthetic valve leaflets moved normally and there was no sub-valvular obstruction. The aortotomy was closed using 4-0 Prolene suture in 2 layers with felt strips to reinforce the closure.    Completion:  The patient was rewarmed to 37 degrees Centigrade. A dose of warm retrograde reanimation cardioplegia was given. Deairing maneuvers were performed and the head placed in Trendelenburg position. The clamp was removed from the LIMA pedicle and there was rapid warming of the septum and return of ventricular fibrillation. The crossclamp was removed with a time of 109 minutes. There was spontaneous return  of ventricular fibrillation and the heart was defibrillated once to sinus rhythm. The distal anastomosis was checked for hemostasis. The position of the graft was satisfactory. Two temporary epicardial pacing wires were placed on the right atrium and two on the right ventricle. The patient was weaned from CPB without difficulty on milrinone 0.25 and Epi 3. CPB time was  142 minutes. Cardiac output was 6 LPM. TEE showed a normally functioning aortic valve prosthesis with a low mean gradient of 4 mm Hg and no paravalvular leak. LV systolic function was unchanged from preop. Heparin was fully reversed with protamine and the aortic and venous cannulas removed. Hemostasis was achieved. Mediastinal and left pleural drainage tubes were placed. The sternum was closed with double #6 stainless steel wires. The fascia was closed with continuous # 1 vicryl suture. The subcutaneous tissue was closed with 2-0 vicryl continuous suture. The skin was closed with 3-0 vicryl subcuticular suture. All sponge, needle, and instrument counts were reported correct at the end of the case. Dry sterile dressings were placed over the incisions and around the chest tubes which were connected to pleurevac suction. The patient was then transported to the surgical intensive care unit in stable condition.

## 2023-01-23 NOTE — Anesthesia Procedure Notes (Signed)
Arterial Line Insertion Start/End1/30/2024 6:50 AM, 01/23/2023 7:00 AM Performed by: Wilburn Cornelia, CRNA, CRNA  Patient location: Pre-op. Preanesthetic checklist: patient identified, IV checked, site marked, risks and benefits discussed, surgical consent, monitors and equipment checked, pre-op evaluation, timeout performed and anesthesia consent Patient sedated Left, radial was placed Catheter size: 20 G Hand hygiene performed  and maximum sterile barriers used   Attempts: 1 Procedure performed without using ultrasound guided technique. Following insertion, dressing applied and Biopatch. Post procedure assessment: unchanged  Patient tolerated the procedure well with no immediate complications. Additional procedure comments: Placed by Thurston Hole .

## 2023-01-23 NOTE — Discharge Summary (Signed)
Physician Discharge Summary  Patient ID: SOM HUDA MRN: [id number] DOB/AGE: 65-Dec-1957 65 y.o.  Admit date: 01/19/2023 Discharge date: 01/27/2023  Admission Diagnoses:  Coronary artery disease Acute NSTEMI Severe aortic stenosis Acute on chronic systolic CHF Hypertension Hyperlipidemia Major depression  Discharge Diagnoses:   Coronary artery disease Acute NSTEMI Severe aortic stenosis Acute on chronic systolic CHF Hypertension Hyperlipidemia Major depression S/P aortic valve replacement S/P CABG x 1  Discharged Condition: good  Follow up appointments requested 01/23/23   History of Present Illness:       Raymond Roberson is a 67 yo male with history of CVA, HTN, HLD,  Gout, GERD, Type 2 DM, Anxiety/Depression, Chronic Pain, Chewing Tobacco, and Hisotry of Alcohol abuse.  The patient presented to an OSH ED in [place] with complaints of chest pain and shortness of breath.  Workup revealed elevated Troponin level.  Admission was recommended but patient signed out AMA.  He presented to [facility] ED on 01/19/2023 with a 3-4 day history of shortness of breath and chest pain.  The symptoms were mostly described as being exertional.  The patient is disabled due to his back.  He also states he needs both knees replaced.  The patient has had some weight loss of about 40 lbs in the past 6 months.  The patient has his own teeth.  He was last evaluated by a dentist 6-12 months ago and had a filling.  He states he no longer drinks regularly, but he may occasionally have a beer.  He uses chewing tobacco but has never smoked.  He is able to walk to his mailbox but does get short of breath.  He is unable to walk up stairs due to his knees.  He has to take a break to get his house work done.  He has a family history of CAD with his father having died in his 73s.  He is currently experiencing occasional chest pain.   He understands he needs surgery as there are not other options to fix his  problems.   Hospital Course: Mr. Arnhold remained stable following left heat catheterization. He decided to proceed with surgery. He was taken to the OR on 01/23/23 where CABG x 1 and aortic valve replacement were carried out without complication. Following the procedure, he separated from cardiopulmonary bypass without complication on milrinone and epinephrine. He was transferred to the surgical ICU in stable condition.   Postoperative hospital course:  The patient has done well.  He was weaned from the ventilator he is in the standard post cardiac surgical protocols without difficulty.  His preoperative EF was 30 to 35% and he initially required some epinephrine and milrinone.  The advanced heart failure team was consulted to assist with management and maximizing therapies.  He did show some evidence of expected postoperative volume overload and has responded well to diuretics.  He has maintained sinus rhythm with some PVCs.  All routine lines, monitors and drainage devices have been discontinued in the standard fashion.  He does have an expected acute blood loss anemia and values have been monitored over time.  They have remained relatively stable.  Blood sugars have been managed by usual protocols and he has been started on Jardiance and Losartan.  The patient has tolerated being off Milrinone.  We will plan to BB on at outpatient follow up.  His surgical incisions are healing without evidence of infection.  He is medically stable for discharge home today.     Consults: cardiology  Significant Diagnostic Studies:   angiography:      Ost RCA lesion is 100% stenosed.   Prox LAD to Mid LAD lesion is 90% stenosed.   Raymond Roberson is a 67 y.o. male   Echocardiogram:  Patient Name:   Raymond Roberson Date of Exam: 01/20/2023  Medical Rec #:  [id number]        Height:       72.0 in  Accession #:    [id number]       Weight:       184.7 lb  Date of Birth:  Jan 22, 1956       BSA:          2.060  m  Patient Age:    67 years         BP:           113/63 mmHg  Patient Gender: M                HR:           80 bpm.  Exam Location:  Inpatient   Procedure: 2D Echo, Cardiac Doppler, Color Doppler and Intracardiac             Opacification Agent   Indications:    CHF-Acute Systolic [id number]    History:        Patient has prior history of Echocardiogram examinations,  most                 recent 07/09/2013. Stroke; Risk Factors:Hypertension,  Diabetes                 and Sleep Apnea. History of bicuspid aortic valve.    Sonographer:    Darlina Sicilian RDCS  Referring Phys: [id number] [place]     1. Calcified aortic valve with inderminant number of cusps; probable  severe AS with mean gradient as high as 37 mmHg; mild AI.   2. Left ventricular ejection fraction, by estimation, is 30 to 35%. The  left ventricle has moderate to severely decreased function. The left  ventricle demonstrates global hypokinesis. The left ventricular internal  cavity size was moderately dilated.  Left ventricular diastolic parameters are consistent with Grade III  diastolic dysfunction (restrictive).   3. Right ventricular systolic function is mildly reduced. The right  ventricular size is normal.   4. The mitral valve is degenerative. Mild mitral valve regurgitation. No  evidence of mitral stenosis.   5. The aortic valve is calcified. Aortic valve regurgitation is mild.  Severe aortic valve stenosis. Aortic regurgitation PHT measures 220 msec.  Aortic valve area, by VTI measures 1.43 cm. Aortic valve mean gradient  measures 33.7 mmHg. Aortic valve Vmax   measures 3.63 m/s.   6. The inferior vena cava is dilated in size with >50% respiratory  variability, suggesting right atrial pressure of 8 mmHg.   Treatments: surgery:   01/23/2023   Surgeon:  Gaye Pollack, MD   First Assistant: Enid Cutter,  PA-C:   An experienced assistant was required given the complexity of this  surgery and the standard of surgical care. The assistant was needed for endoscopic vein harvest, exposure, dissection, suctioning, retraction of delicate tissues and sutures, instrument exchange and for overall help during this procedure.     Preoperative Diagnosis:  Severe multi-vessel coronary artery disease and Severe aortic stenosis.     Postoperative Diagnosis:  Same     Procedure:   Median Sternotomy Extracorporeal  circulation 3.   Coronary artery bypass grafting x 1   Left internal mammary artery graft to the LAD     4.   Endoscopic vein harvest from the right leg 5.   Aortic valve replacement using a 27 mm Edwards INSPIRIS RESILIA pericardial valve    Discharge Exam: By. Dr. Lavonna Monarch Blood pressure 138/81, pulse 99, temperature 98 F (36.7 C), temperature source Oral, resp. rate 20, height 6' (1.829 m), weight 77.7 kg, SpO2 94 %.  EXAM Lungs:clear Card: RR Incisions clean Ext: warm and dry  Disposition: Home  Discharge Instructions     AMB referral to Phase II Cardiac Rehabilitation   Complete by: As directed    Diagnosis: NSTEMI   After initial evaluation and assessments completed: Virtual Based Care may be provided alone or in conjunction with Phase 2 Cardiac Rehab based on patient barriers.: Yes   Intensive Cardiac Rehabilitation (ICR) [place] location only OR Traditional Cardiac Rehabilitation (TCR) *If criteria for ICR are not met will enroll in TCR [facility] only): Yes      Allergies as of 01/27/2023       Reactions   Naproxen Nausea Only   Sulfa Antibiotics Nausea Only        Medication List     STOP taking these medications    allopurinol 300 MG tablet Commonly known as: ZYLOPRIM   metFORMIN 500 MG 24 hr tablet Commonly known as: GLUCOPHAGE-XR       TAKE these medications    ALPRAZolam 0.5 MG tablet Commonly known as: XANAX Take 0.5 mg by mouth 2 (two) times daily.   aspirin 81 MG tablet Take 81 mg by mouth daily.   atorvastatin 80 MG  tablet Commonly known as: LIPITOR Take 1 tablet (80 mg total) by mouth daily. What changed:  medication strength how much to take   citalopram 40 MG tablet Commonly known as: CELEXA Take 20 mg by mouth daily.   cycloSPORINE 0.05 % ophthalmic emulsion Commonly known as: RESTASIS Place 1 drop into both eyes daily.   empagliflozin 10 MG Tabs tablet Commonly known as: JARDIANCE Take 1 tablet (10 mg total) by mouth daily.   esomeprazole 40 MG capsule Commonly known as: NEXIUM Take 40 mg by mouth daily.   GOODYS BODY PAIN PO Take 1 Package by mouth daily as needed (pain/out of aspirin).   HYDROcodone-acetaminophen 5-325 MG tablet Commonly known as: NORCO/VICODIN Take 1 tablet by mouth 4 (four) times daily.   hydrocortisone 2.5 % rectal cream Commonly known as: ANUSOL-HC Place 1 application  rectally daily as needed for hemorrhoids or anal itching.   levocetirizine 5 MG tablet Commonly known as: XYZAL Take 5 mg by mouth daily.   losartan 25 MG tablet Commonly known as: Cozaar Take 1 tablet (25 mg total) by mouth daily.   multivitamin with minerals tablet Take 1 tablet by mouth daily.   ondansetron 8 MG tablet Commonly known as: ZOFRAN Take 8 mg by mouth once a week.   sildenafil 100 MG tablet Commonly known as: VIAGRA Take 100 mg by mouth daily as needed for erectile dysfunction.   spironolactone 25 MG tablet Commonly known as: ALDACTONE Take 1 tablet (25 mg total) by mouth daily.        Follow-up Information     [facility]. Go on 02/06/2023.   Specialty: Cardiothoracic Surgery Why: Your appointment for suture removal is at 10:30 Contact information: [address] [place], [place] [phone]  Gaye Pollack, MD. Go on 02/21/2023.   Specialty: Cardiothoracic Surgery Why: Your appointment for follow up with Dr. Cyndia Bent is at 1:30 Contact information: [place] [address] [place] Follow up on 02/21/2023.   Why: Please get CXR at 12:30 prior to your appointment with Dr. Lyn Henri information: [place] [id number]                The patient has been discharged on:   1.Beta Blocker:  Yes [   ]                              No   [ X  ]                              If No, reason: just off milrinone will address if patient can tolerate on outpatient visit  2.Ace Inhibitor/ARB: Yes [  X ]                                     No  [    ]                                     If No, reason:  3.Statin:   Yes [ X  ]                  No  [   ]                  If No, reason:  4.Ecasa:  Yes  [  X ]                  No   [   ]                  If No, reason:  5. ACS on Admission? No  P2Y12 Inhibitor:  Yes  [   ]                                No  [ x ]    Signed: Accalia Rigdon Ahmed Prima, PA-C 01/27/2023, 9:38 AM

## 2023-01-23 NOTE — Progress Notes (Signed)
Heart Failure Navigator Progress Note  Following this hospitalization to assess for HV TOC readiness.   Patient planned CABG 01/23/2023, will plan to follow thru recovery.   Raymond Roberson, BSN, Clinical cytogeneticist Only

## 2023-01-23 NOTE — Transfer of Care (Signed)
Immediate Anesthesia Transfer of Care Note  Patient: EAGAN SHIFFLETT  Procedure(s) Performed: CORONARY ARTERY BYPASS GRAFTING (CABG) 1 TIMES  USING LEFT INTERNAL MAMMARY ARTERY,  RIGHT SAPHENOUS LEG VEIN HARVESTED ENDOSCOPICALLY (Chest) AORTIC VALVE REPLACEMENT (AVR) USING 27 INSPIRIS VALVE (Chest) TRANSESOPHAGEAL ECHOCARDIOGRAM  Patient Location: SICU  Anesthesia Type:General  Level of Consciousness: Patient remains intubated per anesthesia plan  Airway & Oxygen Therapy: Patient remains intubated per anesthesia plan and Patient placed on Ventilator (see vital sign flow sheet for setting)  Post-op Assessment: Report given to RN and Post -op Vital signs reviewed and stable  Post vital signs: Reviewed and stable  Last Vitals:  Vitals Value Taken Time  BP 114/53 01/23/23 1314  Temp    Pulse 80 01/23/23 1315  Resp 12 01/23/23 1315  SpO2 98 % 01/23/23 1315  Vitals shown include unvalidated device data.  Last Pain:  Vitals:   01/23/23 0448  TempSrc: Oral  PainSc:       Patients Stated Pain Goal: 0 (38/33/38 3291)  Complications: No notable events documented.

## 2023-01-23 NOTE — Progress Notes (Signed)
Patient ID: Raymond Roberson, male   DOB: 05-Nov-1956, 67 y.o.   MRN: [id number]  TCTS Evening Rounds:   Hemodynamically stable  CI = 2.7 on milrinone 0.25, epi 3, NE 8  Has started to wake up on vent. Moves all extremities to command   Urine output good  CT output low  CBC    Component Value Date/Time   WBC 22.0 (H) 01/23/2023 1328   RBC 4.06 (L) 01/23/2023 1328   HGB 11.5 (L) 01/23/2023 1328   HCT 34.1 (L) 01/23/2023 1328   PLT 325 01/23/2023 1328   MCV 84.0 01/23/2023 1328   MCH 28.3 01/23/2023 1328   MCHC 33.7 01/23/2023 1328   RDW 12.6 01/23/2023 1328   LYMPHSABS 1.2 01/19/2023 1543   MONOABS 0.4 01/19/2023 1543   EOSABS 0.0 01/19/2023 1543   BASOSABS 0.0 01/19/2023 1543     BMET    Component Value Date/Time   NA 137 01/23/2023 1207   K 3.6 01/23/2023 1207   CL 98 01/23/2023 1207   CO2 25 01/22/2023 0154   GLUCOSE 182 (H) 01/23/2023 1207   BUN 9 01/23/2023 1207   CREATININE 0.60 (L) 01/23/2023 1207   CALCIUM 8.6 (L) 01/22/2023 0154   GFRNONAA >60 01/22/2023 0154     A/P:  Stable postop course. Continue current plans. Wean to extubate.

## 2023-01-23 NOTE — Procedures (Signed)
Extubation Procedure Note  Patient Details:   Name: Raymond Roberson DOB: 06-25-1956 MRN: [id number]   Airway Documentation:    Vent end date: (not recorded) Vent end time: (not recorded)   Evaluation  O2 sats: stable throughout Complications: No apparent complications Patient did tolerate procedure well. Bilateral Breath Sounds: Clear, Diminished   Yes Cuff leak present before extubation. NIF -30 VC 1.7 Extubated to 4L [place]  Lawernce Keas 01/23/2023, 6:12 PM

## 2023-01-23 NOTE — Anesthesia Procedure Notes (Signed)
Central Venous Catheter Insertion Performed by: Albertha Ghee, MD, anesthesiologist Start/End1/30/2024 7:14 AM, 01/23/2023 7:16 AM Patient location: Pre-op. Preanesthetic checklist: patient identified, IV checked, site marked, risks and benefits discussed, surgical consent, monitors and equipment checked, pre-op evaluation, timeout performed and anesthesia consent Hand hygiene performed  and maximum sterile barriers used  PA cath was placed.Swan type:thermodilution Procedure performed without using ultrasound guided technique. Attempts: 1 Patient tolerated the procedure well with no immediate complications.

## 2023-01-23 NOTE — Anesthesia Preprocedure Evaluation (Signed)
Anesthesia Evaluation  Patient identified by MRN, date of birth, ID band Patient awake    Reviewed: Allergy & Precautions, H&P , NPO status , Patient's Chart, lab work & pertinent test results  Airway Mallampati: II   Neck ROM: full    Dental   Pulmonary sleep apnea    breath sounds clear to auscultation       Cardiovascular hypertension, + angina  + CAD and +CHF  + Valvular Problems/Murmurs AS  Rhythm:regular Rate:Normal  EF 35%, severe AS, bicuspid aortic valve.   Neuro/Psych  Headaches PSYCHIATRIC DISORDERS  Depression    CVA    GI/Hepatic PUD,GERD  ,,  Endo/Other  diabetes, Type 2    Renal/GU      Musculoskeletal  (+) Arthritis ,    Abdominal   Peds  Hematology   Anesthesia Other Findings   Reproductive/Obstetrics                             Anesthesia Physical Anesthesia Plan  ASA: 3  Anesthesia Plan: General   Post-op Pain Management:    Induction: Intravenous  PONV Risk Score and Plan: 2 and Ondansetron, Dexamethasone, Midazolam and Treatment may vary due to age or medical condition  Airway Management Planned: Oral ETT  Additional Equipment: Arterial line, CVP, PA Cath, TEE and Ultrasound Guidance Line Placement  Intra-op Plan:   Post-operative Plan: Post-operative intubation/ventilation  Informed Consent: I have reviewed the patients History and Physical, chart, labs and discussed the procedure including the risks, benefits and alternatives for the proposed anesthesia with the patient or authorized representative who has indicated his/her understanding and acceptance.     Dental advisory given  Plan Discussed with: CRNA, Anesthesiologist and Surgeon  Anesthesia Plan Comments:        Anesthesia Quick Evaluation

## 2023-01-23 NOTE — Progress Notes (Signed)
Intubated on CT surgery service.  Will sign off. Please let us know if we can be of further assistance. [facility] Fayrene Helper, MD

## 2023-01-23 NOTE — Progress Notes (Signed)
Heart Failure Navigator Progress Note  Assessed for Heart & Vascular TOC clinic readiness.  Patient does not meet criteria due to Advanced Heart Failure Team has been consulted. .   Navigator will sign off at this time.    Earnestine Leys, BSN, Clinical cytogeneticist Only

## 2023-01-23 NOTE — Hospital Course (Addendum)
Follow up appointments requested 01/23/23   History of Present Illness:       Montario Zilka is a 67 yo male with history of CVA, HTN, HLD,  Gout, GERD, Type 2 DM, Anxiety/Depression, Chronic Pain, Chewing Tobacco, and Hisotry of Alcohol abuse.  The patient presented to an OSH ED in [place] with complaints of chest pain and shortness of breath.  Workup revealed elevated Troponin level.  Admission was recommended but patient signed out AMA.  He presented to [facility] ED on 01/19/2023 with a 3-4 day history of shortness of breath and chest pain.  The symptoms were mostly described as being exertional.  The patient is disabled due to his back.  He also states he needs both knees replaced.  The patient has had some weight loss of about 40 lbs in the past 6 months.  The patient has his own teeth.  He was last evaluated by a dentist 6-12 months ago and had a filling.  He states he no longer drinks regularly, but he may occasionally have a beer.  He uses chewing tobacco but has never smoked.  He is able to walk to his mailbox but does get short of breath.  He is unable to walk up stairs due to his knees.  He has to take a break to get his house work done.  He has a family history of CAD with his father having died in his 40s.  He is currently experiencing occasional chest pain.   He understands he needs surgery as there are not other options to fix his problems.   Hospital Course: Mr. Brahm remained stable following left heat catheterization. He decided to proceed with surgery. He was taken to the OR on 01/23/23 where CABG x 1 and aortic valve replacement were carried out without complication. Following the procedure, he separated from cardiopulmonary bypass without complication on milrinone and epinephrine. He was transferred to the surgical ICU in stable condition.   Postoperative hospital course:  The patient has done well.  He was weaned from the ventilator he is in the standard post cardiac surgical protocols  without difficulty.  His preoperative EF was 30 to 35% and he initially required some epinephrine and milrinone.  The advanced heart failure team was consulted to assist with management and maximizing therapies.  He did show some evidence of expected postoperative volume overload and has responded well to diuretics.  He has maintained sinus rhythm with some PVCs.  All routine lines, monitors and drainage devices have been discontinued in the standard fashion.  He does have an expected acute blood loss anemia and values have been monitored over time.  They have remained relatively stable.  Blood sugars have been managed by usual protocols and he has been started on Jardiance and Losartan.  The patient has tolerated being off Milrinone.  We will plan to BB on at outpatient follow up.  His surgical incisions are healing without evidence of infection.  He is medically stable for discharge home today.

## 2023-01-24 ENCOUNTER — Other Ambulatory Visit (HOSPITAL_COMMUNITY): Payer: Self-pay

## 2023-01-24 ENCOUNTER — Encounter (HOSPITAL_COMMUNITY): Payer: Self-pay | Admitting: Surgery

## 2023-01-24 ENCOUNTER — Inpatient Hospital Stay (HOSPITAL_COMMUNITY): Payer: Medicare HMO

## 2023-01-24 DIAGNOSIS — R570 Cardiogenic shock: Secondary | ICD-10-CM | POA: Diagnosis not present

## 2023-01-24 DIAGNOSIS — I214 Non-ST elevation (NSTEMI) myocardial infarction: Secondary | ICD-10-CM | POA: Diagnosis not present

## 2023-01-24 LAB — COOXEMETRY PANEL
Carboxyhemoglobin: 2.7 % — ABNORMAL HIGH (ref 0.5–1.5)
Methemoglobin: <0.7 % (ref 0.0–1.5)
O2 Saturation: 71.3 %
Total hemoglobin: 10.2 g/dL — ABNORMAL LOW (ref 12.0–16.0)

## 2023-01-24 LAB — CBC
HCT: 31.5 % — ABNORMAL LOW (ref 39.0–52.0)
HCT: 32.1 % — ABNORMAL LOW (ref 39.0–52.0)
Hemoglobin: 10.6 g/dL — ABNORMAL LOW (ref 13.0–17.0)
Hemoglobin: 10.7 g/dL — ABNORMAL LOW (ref 13.0–17.0)
MCH: 28 pg (ref 26.0–34.0)
MCH: 27.8 pg (ref 26.0–34.0)
MCHC: 33.7 g/dL (ref 30.0–36.0)
MCHC: 33.3 g/dL (ref 30.0–36.0)
MCV: 83.3 fL (ref 80.0–100.0)
MCV: 83.4 fL (ref 80.0–100.0)
Platelets: 258 10*3/uL (ref 150–400)
Platelets: 202 10*3/uL (ref 150–400)
RBC: 3.78 MIL/uL — ABNORMAL LOW (ref 4.22–5.81)
RBC: 3.85 MIL/uL — ABNORMAL LOW (ref 4.22–5.81)
RDW: 12.7 % (ref 11.5–15.5)
RDW: 12.8 % (ref 11.5–15.5)
WBC: 13.5 10*3/uL — ABNORMAL HIGH (ref 4.0–10.5)
WBC: 12.7 10*3/uL — ABNORMAL HIGH (ref 4.0–10.5)
nRBC: 0 % (ref 0.0–0.2)
nRBC: 0 % (ref 0.0–0.2)

## 2023-01-24 LAB — GLUCOSE, CAPILLARY
Glucose-Capillary: 117 mg/dL — ABNORMAL HIGH (ref 70–99)
Glucose-Capillary: 121 mg/dL — ABNORMAL HIGH (ref 70–99)
Glucose-Capillary: 117 mg/dL — ABNORMAL HIGH (ref 70–99)
Glucose-Capillary: 126 mg/dL — ABNORMAL HIGH (ref 70–99)
Glucose-Capillary: 148 mg/dL — ABNORMAL HIGH (ref 70–99)
Glucose-Capillary: 98 mg/dL (ref 70–99)
Glucose-Capillary: 91 mg/dL (ref 70–99)
Glucose-Capillary: 102 mg/dL — ABNORMAL HIGH (ref 70–99)
Glucose-Capillary: 92 mg/dL (ref 70–99)
Glucose-Capillary: 102 mg/dL — ABNORMAL HIGH (ref 70–99)

## 2023-01-24 LAB — BASIC METABOLIC PANEL
Anion gap: 11 (ref 5–15)
Anion gap: 8 (ref 5–15)
BUN: 7 mg/dL — ABNORMAL LOW (ref 8–23)
BUN: 6 mg/dL — ABNORMAL LOW (ref 8–23)
CO2: 23 mmol/L (ref 22–32)
CO2: 25 mmol/L (ref 22–32)
Calcium: 8.5 mg/dL — ABNORMAL LOW (ref 8.9–10.3)
Calcium: 8.6 mg/dL — ABNORMAL LOW (ref 8.9–10.3)
Chloride: 102 mmol/L (ref 98–111)
Chloride: 98 mmol/L (ref 98–111)
Creatinine, Ser: 0.81 mg/dL (ref 0.61–1.24)
Creatinine, Ser: 0.85 mg/dL (ref 0.61–1.24)
GFR, Estimated: >60 mL/min (ref >60)
GFR, Estimated: >60 mL/min (ref >60)
Glucose, Bld: 114 mg/dL — ABNORMAL HIGH (ref 70–99)
Glucose, Bld: 107 mg/dL — ABNORMAL HIGH (ref 70–99)
Potassium: 4 mmol/L (ref 3.5–5.1)
Potassium: 4.4 mmol/L (ref 3.5–5.1)
Sodium: 136 mmol/L (ref 135–145)
Sodium: 131 mmol/L — ABNORMAL LOW (ref 135–145)

## 2023-01-24 LAB — MAGNESIUM
Magnesium: 2.5 mg/dL — ABNORMAL HIGH (ref 1.7–2.4)
Magnesium: 2.2 mg/dL (ref 1.7–2.4)

## 2023-01-24 LAB — SURGICAL PATHOLOGY, GROSS ONLY (NOT ARMC)

## 2023-01-24 MED ORDER — ENOXAPARIN SODIUM 40 MG/0.4ML IJ SOSY
40.0000 mg | PREFILLED_SYRINGE | Freq: Every day | INTRAMUSCULAR | Status: DC
  Administered 2023-01-24 – 2023-01-26 (×3): 40 mg via SUBCUTANEOUS
  Filled 2023-01-24 (×3): qty 0.4

## 2023-01-24 MED ORDER — INSULIN DETEMIR 100 UNIT/ML ~~LOC~~ SOLN
20.0000 [IU] | Freq: Every day | SUBCUTANEOUS | Status: DC
  Administered 2023-01-24: 20 [IU] via SUBCUTANEOUS
  Filled 2023-01-24 (×2): qty 0.2

## 2023-01-24 MED ORDER — POTASSIUM CHLORIDE CRYS ER 20 MEQ PO TBCR
20.0000 meq | EXTENDED_RELEASE_TABLET | Freq: Two times a day (BID) | ORAL | Status: AC
  Administered 2023-01-24 (×2): 20 meq via ORAL
  Filled 2023-01-24 (×2): qty 1

## 2023-01-24 MED ORDER — CITALOPRAM HYDROBROMIDE 20 MG PO TABS
20.0000 mg | ORAL_TABLET | Freq: Every day | ORAL | Status: DC
  Administered 2023-01-24 – 2023-01-26 (×3): 20 mg via ORAL
  Filled 2023-01-24 (×4): qty 1

## 2023-01-24 MED ORDER — INSULIN ASPART 100 UNIT/ML IJ SOLN: 0.0000 [IU] | INTRAMUSCULAR | Status: DC

## 2023-01-24 MED ORDER — PANTOPRAZOLE SODIUM 40 MG PO TBEC
40.0000 mg | DELAYED_RELEASE_TABLET | Freq: Every day | ORAL | Status: DC
  Administered 2023-01-24 – 2023-01-26 (×3): 40 mg via ORAL
  Filled 2023-01-24 (×4): qty 1

## 2023-01-24 MED ORDER — INSULIN DETEMIR 100 UNIT/ML ~~LOC~~ SOLN: 20.0000 [IU] | Freq: Every day | SUBCUTANEOUS | Status: DC

## 2023-01-24 MED ORDER — FUROSEMIDE 10 MG/ML IJ SOLN
40.0000 mg | Freq: Two times a day (BID) | INTRAMUSCULAR | Status: AC
  Administered 2023-01-24 (×2): 40 mg via INTRAVENOUS
  Filled 2023-01-24 (×2): qty 4

## 2023-01-24 MED ORDER — ALPRAZOLAM 0.5 MG PO TABS
0.5000 mg | ORAL_TABLET | Freq: Two times a day (BID) | ORAL | Status: DC | PRN
  Administered 2023-01-24 – 2023-01-25 (×3): 0.5 mg via ORAL
  Filled 2023-01-24 (×3): qty 1

## 2023-01-24 NOTE — Progress Notes (Signed)
[address]       [place] [address]             [phone]       EVENING ROUNDS  POD #1 AVR/Cabg Hemodynamics good on milrinone Just went for a walk Looks good

## 2023-01-24 NOTE — Progress Notes (Signed)
1 Day Post-Op Procedure(s) (LRB): CORONARY ARTERY BYPASS GRAFTING (CABG) 1 TIMES  USING LEFT INTERNAL MAMMARY ARTERY,  RIGHT SAPHENOUS LEG VEIN HARVESTED ENDOSCOPICALLY (N/A) AORTIC VALVE REPLACEMENT (AVR) USING 27 INSPIRIS VALVE (N/A) TRANSESOPHAGEAL ECHOCARDIOGRAM (N/A) Subjective: No complaints.  Hemodynamically stable overnight on milrinone 0.25, epi 3. NE weaned off. CI 3.5 and Co-ox 71%.  Objective: Vital signs in last 24 hours: Temp:  [96.8 F (36 C)-100 F (37.8 C)] 99.5 F (37.5 C) (01/31 0630) Pulse Rate:  [75-96] 88 (01/31 0630) Cardiac Rhythm: Normal sinus rhythm (01/30 2000) Resp:  [10-26] 18 (01/31 0630) BP: (82-115)/(53-77) 97/56 (01/31 0100) SpO2:  [93 %-100 %] 96 % (01/31 0630) Arterial Line BP: (86-155)/(46-65) 123/55 (01/31 0630) FiO2 (%):  [40 %-50 %] 40 % (01/30 1737) Weight:  [75.4 kg] 75.4 kg (01/31 0500)  Hemodynamic parameters for last 24 hours: PAP: (19-42)/(15-31) 33/17 CVP:  [0 mmHg-18 mmHg] 9 mmHg CO:  [4.2 L/min-8.2 L/min] 8 L/min CI:  [2.1 L/min/m2-4.1 L/min/m2] 4 L/min/m2  Intake/Output from previous day: 01/30 0701 - 01/31 0700 In: 5576.2 [I.V.:3377.4; Blood:655; IV Piggyback:1543.9] Out: 9379 [Urine:1913; Blood:1092; Chest Tube:460] Intake/Output this shift: Total I/O In: 1265 [I.V.:965; IV Piggyback:299.9] Out: 985 [Urine:745; Chest Tube:240]  General appearance: alert and cooperative Neurologic: intact Heart: regular rate and rhythm, S1, S2 normal, no murmur Lungs: clear to auscultation bilaterally Abdomen: soft, non-tender; bowel sounds normal Extremities: edema mild Wound: dressings dry  Lab Results: Recent Labs    01/23/23 1915 01/23/23 2024 01/24/23 0359  WBC 19.2*  --  13.5*  HGB 11.4* 11.2* 10.6*  HCT 34.2* 33.0* 31.5*  PLT 370  --  258   BMET:  Recent Labs    01/23/23 1915 01/23/23 2024 01/24/23 0359  NA 136 135 136  K 4.5 4.3 4.0  CL 103  --  102  CO2 23  --  23  GLUCOSE 133*  --  114*  BUN 10  --  7*   CREATININE 0.85  --  0.81  CALCIUM 8.4*  --  8.5*    PT/INR:  Recent Labs    01/23/23 1353  LABPROT 16.0*  INR 1.3*   ABG    Component Value Date/Time   PHART 7.368 01/23/2023 2024   HCO3 23.8 01/23/2023 2024   TCO2 25 01/23/2023 2024   ACIDBASEDEF 1.0 01/23/2023 2024   O2SAT 71.3 01/24/2023 0520   CBG (last 3)  Recent Labs    01/24/23 0158 01/24/23 0402 01/24/23 0604  GLUCAP 121* 117* 126*   CXR: lungs clear. Cardiomegaly.  ECG: sinus, LVH, no acute changes postop.  Assessment/Plan: S/P Procedure(s) (LRB): CORONARY ARTERY BYPASS GRAFTING (CABG) 1 TIMES  USING LEFT INTERNAL MAMMARY ARTERY,  RIGHT SAPHENOUS LEG VEIN HARVESTED ENDOSCOPICALLY (N/A) AORTIC VALVE REPLACEMENT (AVR) USING 27 INSPIRIS VALVE (N/A) TRANSESOPHAGEAL ECHOCARDIOGRAM (N/A)  POD 1 Hemodynamically stable in sinus rhythm. Preop EF 30-35%. Wean off epi, then milrinone. AHF team following. Can DC swan when off epi.   DM: glucose under good control. Transition to Levemir and SSI.  Volume excess: Wts not accurate. Start diuresis.  Keep chest tubes in this morning and will probably remove later today.  IS, OOB.   LOS: 5 days    Raymond Roberson 01/24/2023

## 2023-01-24 NOTE — Progress Notes (Addendum)
Advanced Heart Failure Rounding Note  PCP-Cardiologist: None    Patient Profile   67 y/o male w/ bicuspid AoV, HTN, T2DM and prior TIA admitted w/ NSTEMI. Found to have systolic heart failure, 2V CAD and severe AS. Now s/p CABG + SAVR. AHF team asked to assist w/ post op management of systolic heart failure. Pre-op echo with EF 30-35%, RV mildly reduced.   Subjective:    POD#1   Extubated overnight.   Now off NE. Remains on Epi 2 + Milrinone 0.25.   CI 3.1. Co-ox 71%. CVP 7   WBC down-trending, remains on perioperative abx Hgb stable 10.6  SCr stable 0.81.  K 4.0  UOP good, 1.9L out yesterday   NSR on tele   Objective:   Weight Range: 75.4 kg Body mass index is 22.54 kg/m.   Vital Signs:   Temp:  [96.8 F (36 C)-100 F (37.8 C)] 99.3 F (37.4 C) (01/31 0700) Pulse Rate:  [75-96] 87 (01/31 0700) Resp:  [10-26] 17 (01/31 0700) BP: (82-115)/(53-77) 104/59 (01/31 0700) SpO2:  [93 %-100 %] 95 % (01/31 0700) Arterial Line BP: (86-155)/(46-65) 121/52 (01/31 0700) FiO2 (%):  [40 %-50 %] 40 % (01/30 1737) Weight:  [75.4 kg] 75.4 kg (01/31 0500) Last BM Date : 01/22/23  Weight change: Filed Weights   01/22/23 0549 01/23/23 0622 01/24/23 0500  Weight: 79.9 kg 78.4 kg 75.4 kg    Intake/Output:   Intake/Output Summary (Last 24 hours) at 01/24/2023 0732 Last data filed at 01/24/2023 0700 Gross per 24 hour  Intake 5630.45 ml  Output 3525 ml  Net 2105.45 ml      Physical Exam    CVP 7  General:  Well appearing. No resp difficulty HEENT: Normal Neck: Supple. JVP 7 cm . + Rt IJ swan Carotids 2+ bilat; no bruits. No lymphadenopathy or thyromegaly appreciated. Cor: PMI nondisplaced. Regular rate & rhythm. No rubs, gallops or murmurs. + sternal dressing, + CT  Lungs: Clear Abdomen: Soft, nontender, nondistended. No hepatosplenomegaly. No bruits or masses. Good bowel sounds. Extremities: No cyanosis, clubbing, rash, edema Neuro: Alert & orientedx3, cranial  nerves grossly intact. moves all 4 extremities w/o difficulty. Affect pleasant GU: Foley    Telemetry   NSR 80s-90s   EKG    NSr 87 bpm   Labs    CBC Recent Labs    01/23/23 1915 01/23/23 2024 01/24/23 0359  WBC 19.2*  --  13.5*  NEUTROABS 17.5*  --   --   HGB 11.4* 11.2* 10.6*  HCT 34.2* 33.0* 31.5*  MCV 83.0  --  83.3  PLT 370  --  161   Basic Metabolic Panel Recent Labs    01/22/23 0154 01/22/23 0812 01/23/23 1915 01/23/23 2024 01/24/23 0359  NA 133*   < > 136 135 136  K 4.1   < > 4.5 4.3 4.0  CL 99   < > 103  --  102  CO2 25  --  23  --  23  GLUCOSE 112*   < > 133*  --  114*  BUN 11   < > 10  --  7*  CREATININE 0.98   < > 0.85  --  0.81  CALCIUM 8.6*  --  8.4*  --  8.5*  MG 2.4  --  3.2*  --  2.5*  PHOS 4.1  --   --   --   --    < > = values in this interval not displayed.  Liver Function Tests No results for input(s): "AST", "ALT", "ALKPHOS", "BILITOT", "PROT", "ALBUMIN" in the last 72 hours. No results for input(s): "LIPASE", "AMYLASE" in the last 72 hours. Cardiac Enzymes No results for input(s): "CKTOTAL", "CKMB", "CKMBINDEX", "TROPONINI" in the last 72 hours.  BNP: BNP (last 3 results) Recent Labs    01/19/23 1543  BNP 897.6*    ProBNP (last 3 results) No results for input(s): "PROBNP" in the last 8760 hours.   D-Dimer No results for input(s): "DDIMER" in the last 72 hours. Hemoglobin A1C No results for input(s): "HGBA1C" in the last 72 hours. Fasting Lipid Panel No results for input(s): "CHOL", "HDL", "LDLCALC", "TRIG", "CHOLHDL", "LDLDIRECT" in the last 72 hours. Thyroid Function Tests No results for input(s): "TSH", "T4TOTAL", "T3FREE", "THYROIDAB" in the last 72 hours.  Invalid input(s): "FREET3"  Other results:   Imaging    DG Chest Port 1 View  Result Date: 01/23/2023 CLINICAL DATA:  Status post aortic valve replacement. EXAM: PORTABLE CHEST 1 VIEW COMPARISON:  January 20, 2023. FINDINGS: The heart size and  mediastinal contours are within normal limits. Endotracheal and nasogastric tubes are in grossly good position. Right internal jugular Swan-Ganz catheter is noted with tip in expected position of main pulmonary artery. Left-sided chest tube is noted without pneumothorax. Both lungs are clear. The visualized skeletal structures are unremarkable. IMPRESSION: Endotracheal and nasogastric tubes in grossly good position. Left-sided chest tube is noted without pneumothorax. Electronically Signed   By: Marijo Conception M.D.   On: 01/23/2023 14:06   EP STUDY  Result Date: 01/23/2023 See surgical note for result.    Medications:     Scheduled Medications:  acetaminophen  1,000 mg Oral Q6H   Or   acetaminophen (TYLENOL) oral liquid 160 mg/5 mL  1,000 mg Per Tube Q6H   allopurinol  300 mg Oral Daily   aspirin EC  325 mg Oral Daily   Or   aspirin  324 mg Per Tube Daily   atorvastatin  80 mg Oral Daily   bisacodyl  10 mg Oral Daily   Or   bisacodyl  10 mg Rectal Daily   Chlorhexidine Gluconate Cloth  6 each Topical Daily   citalopram  20 mg Oral Daily   cycloSPORINE  1 drop Both Eyes Daily   docusate sodium  200 mg Oral Daily   enoxaparin (LOVENOX) injection  40 mg Subcutaneous QHS   furosemide  40 mg Intravenous BID   insulin aspart  0-24 Units Subcutaneous Q4H   insulin detemir  20 Units Subcutaneous Daily   [START ON 01/25/2023] pantoprazole  40 mg Oral Daily   potassium chloride  20 mEq Oral BID   sodium chloride flush  3 mL Intravenous Q12H    Infusions:  sodium chloride Stopped (01/24/23 0009)   sodium chloride     sodium chloride 10 mL/hr at 01/24/23 0700    ceFAZolin (ANCEF) IV Stopped (01/24/23 0542)   epinephrine 2 mcg/min (01/24/23 0700)   famotidine (PEPCID) IV Stopped (01/23/23 1400)   lactated ringers Stopped (01/24/23 0602)   lactated ringers 10 mL/hr at 01/24/23 0700   milrinone 0.25 mcg/kg/min (01/24/23 0700)   nitroGLYCERIN Stopped (01/23/23 1324)   phenylephrine  (NEO-SYNEPHRINE) Adult infusion Stopped (01/23/23 1331)    PRN Medications: sodium chloride, ALPRAZolam, metoprolol tartrate, morphine injection, ondansetron (ZOFRAN) IV, oxyCODONE, sodium chloride flush, traMADol    Patient Profile   67 y/o male w/ bicuspid AoV, HTN, T2DM and prior TIA admitted w/ NSTEMI. Found to have systolic  heart failure, 2V CAD and severe AS. Now s/p CABG + SAVR. AHF team asked to assist w/ post op management of systolic heart failure.   Assessment/Plan   1. CAD: s/p NSTEMI. Cath with CTO RCA with collaterals from left + high grade pLAD.  Now s/p CABG with LIMA-LAD.  Unable to graft RCA as too calcified.  - Continue ASA - Continue statin.  2. Aortic stenosis: Severe bicuspid AS.  Normal caliber thoracic aorta.  Now s/p bioprosthetic AVR.   3. Cardiogenic shock: Ischemic cardiomyopathy, pre-op echo with EF 30-35%.  He is now s/p LIMA-LAD + bioprosthetic AVR.  He is currently on Epi 2  +milrinone 0.25.  CI 3.5 with co-ox 71%.  He is making urine. CVP 7 - Continue to wean off Epi, then milrinone - IV Lasix 40 mg ordered by CT surgery  Length of Stay: [address], PA-C  01/24/2023, 7:32 AM  Advanced Heart Failure Team Pager [phone] (M-F; 7a - 5p)  Please contact [facility] for night-coverage after hours (5p -7a ) and weekends on [email or web address]  Patient seen with PA, agree with the above note.   He is doing well this morning, extubated and off NE.  He is on epinephrine 2, milrinone 0.25.    Co-ox 71%, CI 3.1, CVP 7.    General: NAD Neck: No JVD, no thyromegaly or thyroid nodule.  Lungs: Clear to auscultation bilaterally with normal respiratory effort. CV: Nondisplaced PMI.  Heart regular S1/S2, no S3/S4, 1/6 SEM.  No peripheral edema.   Abdomen: Soft, nontender, no hepatosplenomegaly, no distention.  Skin: Intact without lesions or rashes.  Neurologic: Alert and oriented x 3.  Psych: Normal affect. Extremities: No clubbing or cyanosis.  HEENT:  Normal.   Can wean down on epinephrine today, continue milrinone. Keep Luiz Blare today, if he continues to progress, out tomorrow.   Lasix 40 mg IV x 1 and follow response.   CRITICAL CARE Performed by: Loralie Champagne  Total critical care time: 35 minutes  Critical care time was exclusive of separately billable procedures and treating other patients.  Critical care was necessary to treat or prevent imminent or life-threatening deterioration.  Critical care was time spent personally by me on the following activities: development of treatment plan with patient and/or surrogate as well as nursing, discussions with consultants, evaluation of patient's response to treatment, examination of patient, obtaining history from patient or surrogate, ordering and performing treatments and interventions, ordering and review of laboratory studies, ordering and review of radiographic studies, pulse oximetry and re-evaluation of patient's condition.  Loralie Champagne 01/24/2023 8:22 AM

## 2023-01-25 ENCOUNTER — Inpatient Hospital Stay (HOSPITAL_COMMUNITY): Payer: Medicare HMO

## 2023-01-25 LAB — CBC
HCT: 28.8 % — ABNORMAL LOW (ref 39.0–52.0)
Hemoglobin: 9.4 g/dL — ABNORMAL LOW (ref 13.0–17.0)
MCH: 27.9 pg (ref 26.0–34.0)
MCHC: 32.6 g/dL (ref 30.0–36.0)
MCV: 85.5 fL (ref 80.0–100.0)
Platelets: 162 10*3/uL (ref 150–400)
RBC: 3.37 MIL/uL — ABNORMAL LOW (ref 4.22–5.81)
RDW: 12.7 % (ref 11.5–15.5)
WBC: 11 10*3/uL — ABNORMAL HIGH (ref 4.0–10.5)
nRBC: 0 % (ref 0.0–0.2)

## 2023-01-25 LAB — GLUCOSE, CAPILLARY
Glucose-Capillary: 74 mg/dL (ref 70–99)
Glucose-Capillary: 123 mg/dL — ABNORMAL HIGH (ref 70–99)
Glucose-Capillary: 76 mg/dL (ref 70–99)
Glucose-Capillary: 94 mg/dL (ref 70–99)
Glucose-Capillary: 96 mg/dL (ref 70–99)

## 2023-01-25 LAB — BASIC METABOLIC PANEL
Anion gap: 7 (ref 5–15)
BUN: 9 mg/dL (ref 8–23)
CO2: 28 mmol/L (ref 22–32)
Calcium: 8.4 mg/dL — ABNORMAL LOW (ref 8.9–10.3)
Chloride: 98 mmol/L (ref 98–111)
Creatinine, Ser: 0.81 mg/dL (ref 0.61–1.24)
GFR, Estimated: >60 mL/min (ref >60)
Glucose, Bld: 85 mg/dL (ref 70–99)
Potassium: 4.1 mmol/L (ref 3.5–5.1)
Sodium: 133 mmol/L — ABNORMAL LOW (ref 135–145)

## 2023-01-25 LAB — COOXEMETRY PANEL
Carboxyhemoglobin: 1.5 % (ref 0.5–1.5)
Methemoglobin: <0.7 % (ref 0.0–1.5)
O2 Saturation: 62.6 %
Total hemoglobin: 9.8 g/dL — ABNORMAL LOW (ref 12.0–16.0)

## 2023-01-25 LAB — ECHO INTRAOPERATIVE TEE
Height: 72 in
Weight: 2764.8 oz

## 2023-01-25 MED ORDER — SPIRONOLACTONE 12.5 MG HALF TABLET
12.5000 mg | ORAL_TABLET | Freq: Every day | ORAL | Status: DC
  Administered 2023-01-25: 12.5 mg via ORAL
  Filled 2023-01-25: qty 1

## 2023-01-25 MED ORDER — INSULIN ASPART 100 UNIT/ML IJ SOLN: 0.0000 [IU] | Freq: Three times a day (TID) | INTRAMUSCULAR | Status: DC

## 2023-01-25 MED ORDER — EMPAGLIFLOZIN 10 MG PO TABS
10.0000 mg | ORAL_TABLET | Freq: Every day | ORAL | Status: DC
  Administered 2023-01-25 – 2023-01-26 (×2): 10 mg via ORAL
  Filled 2023-01-25 (×3): qty 1

## 2023-01-25 MED ORDER — INSULIN DETEMIR 100 UNIT/ML ~~LOC~~ SOLN
10.0000 [IU] | Freq: Every day | SUBCUTANEOUS | Status: DC
  Administered 2023-01-25: 10 [IU] via SUBCUTANEOUS
  Filled 2023-01-25 (×2): qty 0.1

## 2023-01-25 MED ORDER — FUROSEMIDE 10 MG/ML IJ SOLN
40.0000 mg | Freq: Once | INTRAMUSCULAR | Status: AC
  Administered 2023-01-25: 40 mg via INTRAVENOUS
  Filled 2023-01-25: qty 4

## 2023-01-25 NOTE — Anesthesia Postprocedure Evaluation (Signed)
Anesthesia Post Note  Patient: Raymond Roberson  Procedure(s) Performed: CORONARY ARTERY BYPASS GRAFTING (CABG) 1 TIMES  USING LEFT INTERNAL MAMMARY ARTERY,  RIGHT SAPHENOUS LEG VEIN HARVESTED ENDOSCOPICALLY (Chest) AORTIC VALVE REPLACEMENT (AVR) USING 27 INSPIRIS VALVE (Chest) TRANSESOPHAGEAL ECHOCARDIOGRAM     Patient location during evaluation: SICU Anesthesia Type: General Level of consciousness: sedated Pain management: pain level controlled Vital Signs Assessment: post-procedure vital signs reviewed and stable Respiratory status: patient remains intubated per anesthesia plan Cardiovascular status: stable Postop Assessment: no apparent nausea or vomiting Anesthetic complications: no   No notable events documented.  Last Vitals:  Vitals:   01/25/23 0600 01/25/23 0700  BP: 103/60 110/78  Pulse: 89 (!) 103  Resp: 14 (!) 23  Temp:    SpO2: 95% 92%    Last Pain:  Vitals:   01/25/23 0315  TempSrc: Oral  PainSc:                  [place]

## 2023-01-25 NOTE — Progress Notes (Signed)
EVENING ROUNDS NOTE :     [place] [address] [place] [address]       [place] [address]             [phone]                 2 Days Post-Op Procedure(s) (LRB): CORONARY ARTERY BYPASS GRAFTING (CABG) 1 TIMES  USING LEFT INTERNAL MAMMARY ARTERY,  RIGHT SAPHENOUS LEG VEIN HARVESTED ENDOSCOPICALLY (N/A) AORTIC VALVE REPLACEMENT (AVR) USING 27 INSPIRIS VALVE (N/A) TRANSESOPHAGEAL ECHOCARDIOGRAM (N/A)   Total Length of Stay:  LOS: 6 days  Events:   No events Ambulated today Low dose milr    BP 109/63 (BP Location: Right Arm)   Pulse 94   Temp 98.2 F (36.8 C) (Oral)   Resp 19   Ht 6' (1.829 m)   Wt 80.7 kg   SpO2 95%   BMI 24.13 kg/m   CVP:  [0 mmHg-50 mmHg] 6 mmHg      sodium chloride Stopped (01/24/23 0009)   sodium chloride     sodium chloride 10 mL/hr at 01/25/23 1100   lactated ringers Stopped (01/25/23 0039)   lactated ringers 10 mL/hr at 01/25/23 1100   milrinone 0.125 mcg/kg/min (01/25/23 1100)    I/O last 3 completed shifts: In: 2619.3 [P.O.:180; I.V.:1738.9; IV Piggyback:700.4] Out: 6600 [Urine:6270; Chest Tube:330]      Latest Ref Rng & Units 01/25/2023    5:01 AM 01/24/2023    4:18 PM 01/24/2023    3:59 AM  CBC  WBC 4.0 - 10.5 K/uL 11.0  12.7  13.5   Hemoglobin 13.0 - 17.0 g/dL 9.4  10.7  10.6   Hematocrit 39.0 - 52.0 % 28.8  32.1  31.5   Platelets 150 - 400 K/uL 162  202  258        Latest Ref Rng & Units 01/25/2023    5:01 AM 01/24/2023    4:18 PM 01/24/2023    3:59 AM  BMP  Glucose 70 - 99 mg/dL 85  107  114   BUN 8 - 23 mg/dL '9  6  7   '$ Creatinine 0.61 - 1.24 mg/dL 0.81  0.85  0.81   Sodium 135 - 145 mmol/L 133  131  136   Potassium 3.5 - 5.1 mmol/L 4.1  4.4  4.0   Chloride 98 - 111 mmol/L 98  98  102   CO2 22 - 32 mmol/L '28  25  23   '$ Calcium 8.9 - 10.3 mg/dL 8.4  8.6  8.5     ABG    Component Value Date/Time   PHART 7.368 01/23/2023 2024   PCO2ART 41.6 01/23/2023 2024   PO2ART 89 01/23/2023 2024   HCO3 23.8 01/23/2023 2024   TCO2 25  01/23/2023 2024   ACIDBASEDEF 1.0 01/23/2023 2024   O2SAT 62.6 01/25/2023 0505       Melodie Bouillon, MD 01/25/2023 4:47 PM

## 2023-01-25 NOTE — Progress Notes (Signed)
2 Days Post-Op Procedure(s) (LRB): CORONARY ARTERY BYPASS GRAFTING (CABG) 1 TIMES  USING LEFT INTERNAL MAMMARY ARTERY,  RIGHT SAPHENOUS LEG VEIN HARVESTED ENDOSCOPICALLY (N/A) AORTIC VALVE REPLACEMENT (AVR) USING 27 INSPIRIS VALVE (N/A) TRANSESOPHAGEAL ECHOCARDIOGRAM (N/A) Subjective: No complaints.  Objective: Vital signs in last 24 hours: Temp:  [97.7 F (36.5 C)-99.5 F (37.5 C)] 98.2 F (36.8 C) (02/01 0315) Pulse Rate:  [85-103] 103 (02/01 0700) Cardiac Rhythm: Normal sinus rhythm (01/31 2000) Resp:  [14-28] 23 (02/01 0700) BP: (95-125)/(46-78) 110/78 (02/01 0700) SpO2:  [91 %-100 %] 92 % (02/01 0700) Arterial Line BP: (107-143)/(50-61) 122/53 (01/31 1600) Weight:  [80.7 kg] 80.7 kg (02/01 0600)  Hemodynamic parameters for last 24 hours: PAP: (26-30)/(16-23) 26/19 CVP:  [0 mmHg-50 mmHg] 9 mmHg CO:  [5.9 L/min-7.6 L/min] 6.5 L/min CI:  [3 L/min/m2-3.8 L/min/m2] 3.2 L/min/m2  Intake/Output from previous day: 01/31 0701 - 02/01 0700 In: 1300.1 [P.O.:180; I.V.:719.7; IV Piggyback:400.4] Out: 5555 [Urine:5475; Chest Tube:80] Intake/Output this shift: No intake/output data recorded.  General appearance: alert and cooperative Neurologic: intact Heart: regular rate and rhythm, S1, S2 normal, no murmur Lungs: diminished breath sounds bibasilar Extremities: no edema Wound: dressings dry  Lab Results: Recent Labs    01/24/23 1618 01/25/23 0501  WBC 12.7* 11.0*  HGB 10.7* 9.4*  HCT 32.1* 28.8*  PLT 202 162   BMET:  Recent Labs    01/24/23 1618 01/25/23 0501  NA 131* 133*  K 4.4 4.1  CL 98 98  CO2 25 28  GLUCOSE 107* 85  BUN 6* 9  CREATININE 0.85 0.81  CALCIUM 8.6* 8.4*    PT/INR:  Recent Labs    01/23/23 1353  LABPROT 16.0*  INR 1.3*   ABG    Component Value Date/Time   PHART 7.368 01/23/2023 2024   HCO3 23.8 01/23/2023 2024   TCO2 25 01/23/2023 2024   ACIDBASEDEF 1.0 01/23/2023 2024   O2SAT 62.6 01/25/2023 0505   CBG (last 3)  Recent Labs     01/24/23 1946 01/24/23 2308 01/25/23 0318  GLUCAP 92 102* 51   CXR: left basilar atelectasis  Assessment/Plan: S/P Procedure(s) (LRB): CORONARY ARTERY BYPASS GRAFTING (CABG) 1 TIMES  USING LEFT INTERNAL MAMMARY ARTERY,  RIGHT SAPHENOUS LEG VEIN HARVESTED ENDOSCOPICALLY (N/A) AORTIC VALVE REPLACEMENT (AVR) USING 27 INSPIRIS VALVE (N/A) TRANSESOPHAGEAL ECHOCARDIOGRAM (N/A)  POD 2 Hemodynamically stable in sinus rhythm on Milrinone 0.25, Co-ox 63% this am. AHF team will decide when to wean off milrinone.  Diuresed -4200 cc yesterday. He is about 5 lbs over preop wt but below admission weight.  DC foley.  Continue IS, ambulation.  DM: glucose down to 74 this am. Will decrease Levemir to 10 and SSI to TID.   LOS: 6 days    Gaye Pollack 01/25/2023

## 2023-01-25 NOTE — Progress Notes (Addendum)
Advanced Heart Failure Rounding Note  PCP-Cardiologist: None    Patient Profile   67 y/o male w/ bicuspid AoV, HTN, T2DM and prior TIA admitted w/ NSTEMI. Found to have systolic heart failure, 2V CAD and severe AS. Now s/p CABG + SAVR. AHF team asked to assist w/ post op management of systolic heart failure. Pre-op echo with EF 30-35%, RV mildly reduced.   Subjective:    POD#2   On Milrinone 0.25. off Epi. Co-ox 63%   5.4L in UOP yesterday w/ IV Lasix. CVP ~8. Still ~5 lb above pre-op wt    SCr stable 0.81.  K 4.01  NSR on tele   OOB, sitting up in chair. Feels ok. Appetite is good. No BM yet. Passing gas.   Objective:   Weight Range: 80.7 kg Body mass index is 24.13 kg/m.   Vital Signs:   Temp:  [97.7 F (36.5 C)-99.5 F (37.5 C)] 98.2 F (36.8 C) (02/01 0315) Pulse Rate:  [85-103] 103 (02/01 0700) Resp:  [14-28] 23 (02/01 0700) BP: (95-125)/(46-78) 110/78 (02/01 0700) SpO2:  [91 %-100 %] 92 % (02/01 0700) Arterial Line BP: (107-143)/(50-61) 122/53 (01/31 1600) Weight:  [80.7 kg] 80.7 kg (02/01 0600) Last BM Date : 01/22/23  Weight change: Filed Weights   01/23/23 0622 01/24/23 0500 01/25/23 0600  Weight: 78.4 kg 75.4 kg 80.7 kg    Intake/Output:   Intake/Output Summary (Last 24 hours) at 01/25/2023 0753 Last data filed at 01/25/2023 0700 Gross per 24 hour  Intake 1300.11 ml  Output 5555 ml  Net -4254.89 ml      Physical Exam    CVP 8  General:  Well appearing. No resp difficulty HEENT: Normal Neck: Supple. JVP 8 cm . + Rt IJ swan Carotids 2+ bilat; no bruits. No lymphadenopathy or thyromegaly appreciated. Cor: PMI nondisplaced. Regular rate & rhythm. No rubs, gallops or murmurs. + sternal dressing Lungs: Clear Abdomen: Soft, nontender, nondistended. No hepatosplenomegaly. No bruits or masses. Good bowel sounds. Extremities: No cyanosis, clubbing, rash, trace b/l LE edema Neuro: Alert & orientedx3, cranial nerves grossly intact. moves all 4  extremities w/o difficulty. Affect pleasant   Telemetry   NSR 80s-90s   EKG    NSr 87 bpm   Labs    CBC Recent Labs    01/23/23 1915 01/23/23 2024 01/24/23 1618 01/25/23 0501  WBC 19.2*   < > 12.7* 11.0*  NEUTROABS 17.5*  --   --   --   HGB 11.4*   < > 10.7* 9.4*  HCT 34.2*   < > 32.1* 28.8*  MCV 83.0   < > 83.4 85.5  PLT 370   < > 202 162   < > = values in this interval not displayed.   Basic Metabolic Panel Recent Labs    01/24/23 0359 01/24/23 1618 01/25/23 0501  NA 136 131* 133*  K 4.0 4.4 4.1  CL 102 98 98  CO2 '23 25 28  '$ GLUCOSE 114* 107* 85  BUN 7* 6* 9  CREATININE 0.81 0.85 0.81  CALCIUM 8.5* 8.6* 8.4*  MG 2.5* 2.2  --    Liver Function Tests No results for input(s): "AST", "ALT", "ALKPHOS", "BILITOT", "PROT", "ALBUMIN" in the last 72 hours. No results for input(s): "LIPASE", "AMYLASE" in the last 72 hours. Cardiac Enzymes No results for input(s): "CKTOTAL", "CKMB", "CKMBINDEX", "TROPONINI" in the last 72 hours.  BNP: BNP (last 3 results) Recent Labs    01/19/23 1543  BNP 897.6*  ProBNP (last 3 results) No results for input(s): "PROBNP" in the last 8760 hours.   D-Dimer No results for input(s): "DDIMER" in the last 72 hours. Hemoglobin A1C No results for input(s): "HGBA1C" in the last 72 hours. Fasting Lipid Panel No results for input(s): "CHOL", "HDL", "LDLCALC", "TRIG", "CHOLHDL", "LDLDIRECT" in the last 72 hours. Thyroid Function Tests No results for input(s): "TSH", "T4TOTAL", "T3FREE", "THYROIDAB" in the last 72 hours.  Invalid input(s): "FREET3"  Other results:   Imaging    VAS US DOPPLER PRE CABG  Result Date: 01/22/2023 PREOPERATIVE VASCULAR EVALUATION Patient Name:  Raymond Roberson  Date of Exam:   01/22/2023 Medical Rec #: [id number]         Accession #:    [id number] Date of Birth: [birth year]        Patient Gender: M Patient Age:   60 years Exam Location:  [facility] Procedure:      VAS US DOPPLER PRE CABG  Referring Phys: Gilford Raid --------------------------------------------------------------------------------  Indications:   Pre-CABG. Risk Factors:  Hypertension, hyperlipidemia, current smoker. Other Factors: Bicuspid aortic valve. Performing Technologist: Sharion Dove RVS  Examination Guidelines: A complete evaluation includes B-mode imaging, spectral Doppler, color Doppler, and power Doppler as needed of all accessible portions of each vessel. Bilateral testing is considered an integral part of a complete examination. Limited examinations for reoccurring indications may be performed as noted.  Right Carotid Findings: +----------+--------+--------+--------+------------+------------------+           PSV cm/sEDV cm/sStenosisDescribe    Comments           +----------+--------+--------+--------+------------+------------------+ CCA Prox  88      23                          intimal thickening +----------+--------+--------+--------+------------+------------------+ CCA Distal93      19                          intimal thickening +----------+--------+--------+--------+------------+------------------+ ICA Prox  72      26              heterogenous                   +----------+--------+--------+--------+------------+------------------+ ICA Mid   86      32                                             +----------+--------+--------+--------+------------+------------------+ ICA Distal88      32                                             +----------+--------+--------+--------+------------+------------------+ ECA       90      13                                             +----------+--------+--------+--------+------------+------------------+ +----------+--------+-------+--------+------------+           PSV cm/sEDV cmsDescribeArm Pressure +----------+--------+-------+--------+------------+ Subclavian102                                  +----------+--------+-------+--------+------------+ +---------+--------+--+--------+--+  VertebralPSV cm/s44EDV cm/s11 +---------+--------+--+--------+--+ Left Carotid Findings: +----------+--------+--------+--------+--------+------------------+           PSV cm/sEDV cm/sStenosisDescribeComments           +----------+--------+--------+--------+--------+------------------+ CCA Prox  92      24                      intimal thickening +----------+--------+--------+--------+--------+------------------+ CCA Distal80      25              calcific                   +----------+--------+--------+--------+--------+------------------+ ICA Prox  71      21              calcific                   +----------+--------+--------+--------+--------+------------------+ ICA Mid   80      27                                         +----------+--------+--------+--------+--------+------------------+ ICA Distal103     31                                         +----------+--------+--------+--------+--------+------------------+ ECA       182     22                                         +----------+--------+--------+--------+--------+------------------+ +----------+--------+--------+--------+------------+ SubclavianPSV cm/sEDV cm/sDescribeArm Pressure +----------+--------+--------+--------+------------+           99                                   +----------+--------+--------+--------+------------+ +---------+--------+--+--------+--+ VertebralPSV cm/s45EDV cm/s18 +---------+--------+--+--------+--+  ABI Findings: +---------+------------------+-----+-----------+--------+ Right    Rt Pressure (mmHg)IndexWaveform   Comment  +---------+------------------+-----+-----------+--------+ Brachial 128                    triphasic           +---------+------------------+-----+-----------+--------+ PTA      118               0.92 multiphasic          +---------+------------------+-----+-----------+--------+ DP       143               1.12 multiphasic         +---------+------------------+-----+-----------+--------+ Great Toe111               0.87 Abnormal            +---------+------------------+-----+-----------+--------+ +---------+------------------+-----+-----------+-------+ Left     Lt Pressure (mmHg)IndexWaveform   Comment +---------+------------------+-----+-----------+-------+ Brachial 119                    triphasic          +---------+------------------+-----+-----------+-------+ PTA      143               1.12 multiphasic        +---------+------------------+-----+-----------+-------+ DP       137  1.07 multiphasic        +---------+------------------+-----+-----------+-------+ Great Toe107               0.84 Abnormal           +---------+------------------+-----+-----------+-------+ +-------+---------------+----------------+ ABI/TBIToday's ABI/TBIPrevious ABI/TBI +-------+---------------+----------------+ Right  1.12/0.87                       +-------+---------------+----------------+ Left   1.12/0.84                       +-------+---------------+----------------+  Right Doppler Findings: +--------+--------+-----+---------+--------+ Site    PressureIndexDoppler  Comments +--------+--------+-----+---------+--------+ [id number]          triphasic         +--------+--------+-----+---------+--------+ Radial               triphasic         +--------+--------+-----+---------+--------+ Ulnar                triphasic         +--------+--------+-----+---------+--------+  Left Doppler Findings: +--------+--------+-----+---------+--------+ Site    PressureIndexDoppler  Comments +--------+--------+-----+---------+--------+ [id number]          triphasic         +--------+--------+-----+---------+--------+ Radial               triphasic          +--------+--------+-----+---------+--------+ Ulnar                triphasic         +--------+--------+-----+---------+--------+   Summary: Right Carotid: Velocities in the right ICA are consistent with a 1-39% stenosis. Left Carotid: Velocities in the left ICA are consistent with a 1-39% stenosis. Vertebrals:  Bilateral vertebral arteries demonstrate antegrade flow. Subclavians: Normal flow hemodynamics were seen in bilateral subclavian              arteries. Right ABI: Resting right ankle-brachial index is within normal range. The right toe-brachial index is normal. Left ABI: Resting left ankle-brachial index is within normal range. The left toe-brachial index is normal.  Electronically signed by Monica Martinez MD on 01/22/2023 at 5:44:32 PM.    Final      Medications:     Scheduled Medications:  acetaminophen  1,000 mg Oral Q6H   Or   acetaminophen (TYLENOL) oral liquid 160 mg/5 mL  1,000 mg Per Tube Q6H   allopurinol  300 mg Oral Daily   aspirin EC  325 mg Oral Daily   Or   aspirin  324 mg Per Tube Daily   atorvastatin  80 mg Oral Daily   bisacodyl  10 mg Oral Daily   Or   bisacodyl  10 mg Rectal Daily   Chlorhexidine Gluconate Cloth  6 each Topical Daily   citalopram  20 mg Oral Daily   cycloSPORINE  1 drop Both Eyes Daily   docusate sodium  200 mg Oral Daily   enoxaparin (LOVENOX) injection  40 mg Subcutaneous QHS   insulin aspart  0-24 Units Subcutaneous TID WC   insulin detemir  10 Units Subcutaneous Daily   pantoprazole  40 mg Oral Daily   sodium chloride flush  3 mL Intravenous Q12H    Infusions:  sodium chloride Stopped (01/24/23 0009)   sodium chloride     sodium chloride 10 mL/hr at 01/25/23 0700   lactated ringers Stopped (01/25/23 0039)   lactated ringers 10 mL/hr at 01/25/23 0700   milrinone 0.25 mcg/kg/min (01/25/23 0700)  PRN Medications: sodium chloride, ALPRAZolam, metoprolol tartrate, morphine injection, ondansetron (ZOFRAN) IV, oxyCODONE, sodium  chloride flush, traMADol    Patient Profile   67 y/o male w/ bicuspid AoV, HTN, T2DM and prior TIA admitted w/ NSTEMI. Found to have systolic heart failure, 2V CAD and severe AS. Now s/p CABG + SAVR. AHF team asked to assist w/ post op management of systolic heart failure.   Assessment/Plan   1. CAD: s/p NSTEMI. Cath with CTO RCA with collaterals from left + high grade pLAD.  Now s/p CABG with LIMA-LAD.  Unable to graft RCA as too calcified.  - Continue ASA - Continue statin.  2. Aortic stenosis: Severe bicuspid AS.  Normal caliber thoracic aorta.  Now s/p bioprosthetic AVR.   3. Cardiogenic shock: Ischemic cardiomyopathy, pre-op echo with EF 30-35%.  He is now s/p LIMA-LAD + bioprosthetic AVR.  He is currently on milrinone 0.25.  Co-ox 71%.  He is making urine. CVP 8. Still 5 lb above pre-op wt.  - repeat IV Lasix 40 mg x 1  - wean milrinone to 0.125   - add spiro 12.5 mg daily   Length of Stay: [address] PA-C  01/25/2023, 7:53 AM  Advanced Heart Failure Team Pager [phone] (M-F; 7a - 5p)  Please contact [facility] for night-coverage after hours (5p -7a ) and weekends on [email or web address]  Patient seen with PA, agree with the above note.    Excellent diuresis yesterday, CVP 8 today.  Co-ox 63% on milrinone 0.25, off epinephrine.  Creatinine 0.81.    No complaints.   General: NAD Neck: JVP 8-9 cm, no thyromegaly or thyroid nodule.  Lungs: Clear to auscultation bilaterally with normal respiratory effort. CV: Nondisplaced PMI.  Heart regular S1/S2, no S3/S4, no murmur.  No peripheral edema.  Abdomen: Soft, nontender, no hepatosplenomegaly, no distention.  Skin: Intact without lesions or rashes.  Neurologic: Alert and oriented x 3.  Psych: Normal affect. Extremities: No clubbing or cyanosis.  HEENT: Normal.   Decrease milrinone to 0.125, hopefully stop either this evening or tomorrow morning.  Will give Lasix 40 mg IV x 1.  Can start spironolactone 12.5 daily and  Jardiance 10 daily.   Continue to mobilize.   Loralie Champagne 01/25/2023 9:04 AM

## 2023-01-25 NOTE — Progress Notes (Signed)
Epicardial pacing wires removed at 1715. Pt began bedrest. Pt tolerated well. VSS. Bed rest will end 1615.

## 2023-01-26 ENCOUNTER — Other Ambulatory Visit: Payer: Self-pay | Admitting: Cardiology

## 2023-01-26 DIAGNOSIS — Z952 Presence of prosthetic heart valve: Secondary | ICD-10-CM

## 2023-01-26 LAB — COOXEMETRY PANEL
Carboxyhemoglobin: 2.2 % — ABNORMAL HIGH (ref 0.5–1.5)
Carboxyhemoglobin: 1.4 % (ref 0.5–1.5)
Methemoglobin: 0.7 % (ref 0.0–1.5)
Methemoglobin: <0.7 % (ref 0.0–1.5)
O2 Saturation: 60.4 %
O2 Saturation: 53.4 %
Total hemoglobin: 9 g/dL — ABNORMAL LOW (ref 12.0–16.0)
Total hemoglobin: 9 g/dL — ABNORMAL LOW (ref 12.0–16.0)

## 2023-01-26 LAB — GLUCOSE, CAPILLARY
Glucose-Capillary: 101 mg/dL — ABNORMAL HIGH (ref 70–99)
Glucose-Capillary: 104 mg/dL — ABNORMAL HIGH (ref 70–99)
Glucose-Capillary: 121 mg/dL — ABNORMAL HIGH (ref 70–99)
Glucose-Capillary: 89 mg/dL (ref 70–99)
Glucose-Capillary: 94 mg/dL (ref 70–99)

## 2023-01-26 LAB — BASIC METABOLIC PANEL
Anion gap: 8 (ref 5–15)
BUN: 11 mg/dL (ref 8–23)
CO2: 28 mmol/L (ref 22–32)
Calcium: 8.5 mg/dL — ABNORMAL LOW (ref 8.9–10.3)
Chloride: 100 mmol/L (ref 98–111)
Creatinine, Ser: 0.82 mg/dL (ref 0.61–1.24)
GFR, Estimated: >60 mL/min (ref >60)
Glucose, Bld: 87 mg/dL (ref 70–99)
Potassium: 3.7 mmol/L (ref 3.5–5.1)
Sodium: 136 mmol/L (ref 135–145)

## 2023-01-26 LAB — MAGNESIUM: Magnesium: 2.4 mg/dL (ref 1.7–2.4)

## 2023-01-26 MED ORDER — SPIRONOLACTONE 25 MG PO TABS
25.0000 mg | ORAL_TABLET | Freq: Every day | ORAL | Status: DC
  Administered 2023-01-26: 25 mg via ORAL
  Filled 2023-01-26 (×2): qty 1

## 2023-01-26 MED ORDER — METFORMIN HCL ER 500 MG PO TB24
500.0000 mg | ORAL_TABLET | Freq: Every day | ORAL | Status: DC
  Filled 2023-01-26: qty 1

## 2023-01-26 MED ORDER — POTASSIUM CHLORIDE CRYS ER 20 MEQ PO TBCR
20.0000 meq | EXTENDED_RELEASE_TABLET | ORAL | Status: AC
  Administered 2023-01-26 (×3): 20 meq via ORAL
  Filled 2023-01-26 (×2): qty 1

## 2023-01-26 NOTE — Progress Notes (Addendum)
Advanced Heart Failure Rounding Note  PCP-Cardiologist: None    Patient Profile   67 y/o male w/ bicuspid AoV, HTN, T2DM and prior TIA admitted w/ NSTEMI. Found to have systolic heart failure, 2V CAD and severe AS. Now s/p CABG + SAVR. AHF team asked to assist w/ post op management of systolic heart failure. Pre-op echo with EF 30-35%, RV mildly reduced.   Subjective:    POD#3   Now off milrinone. CO-OX 60%   Feels good. Walked multiple times around the unit.    Objective:   Weight Range: 78.1 kg Body mass index is 23.35 kg/m.   Vital Signs:   Temp:  [97.5 F (36.4 C)-99.1 F (37.3 C)] 99.1 F (37.3 C) (02/02 0400) Pulse Rate:  [85-105] 86 (02/01 2200) Resp:  [12-26] 12 (02/02 0700) BP: (88-151)/(44-79) 116/77 (02/02 0700) SpO2:  [90 %-100 %] 92 % (02/02 0400) Weight:  [78.1 kg] 78.1 kg (02/02 0635) Last BM Date : 01/22/23  Weight change: Filed Weights   01/24/23 0500 01/25/23 0600 01/26/23 0635  Weight: 75.4 kg 80.7 kg 78.1 kg    Intake/Output:   Intake/Output Summary (Last 24 hours) at 01/26/2023 0831 Last data filed at 01/26/2023 0600 Gross per 24 hour  Intake 458.31 ml  Output 3700 ml  Net -3241.69 ml     CVP 7 personally checked.  Physical Exam    General:  No resp difficulty. In the chair.  HEENT: normal Neck: supple. JVP 6-7  . Carotids 2+ bilat; no bruits. No lymphadenopathy or thryomegaly appreciated. RIJ  Cor: PMI nondisplaced. Regular rate & rhythm. No rubs, gallops or murmurs. Sternal dressing intact.  Lungs: clear Abdomen: soft, nontender, nondistended. No hepatosplenomegaly. No bruits or masses. Good bowel sounds. Extremities: no cyanosis, clubbing, rash, edema Neuro: alert & orientedx3, cranial nerves grossly intact. moves all 4 extremities w/o difficulty. Affect pleasant   Telemetry   SR with brief NSVT.   EKG    NSr 87 bpm   Labs    CBC Recent Labs    01/23/23 1915 01/23/23 2024 01/24/23 1618 01/25/23 0501  WBC 19.2*    < > 12.7* 11.0*  NEUTROABS 17.5*  --   --   --   HGB 11.4*   < > 10.7* 9.4*  HCT 34.2*   < > 32.1* 28.8*  MCV 83.0   < > 83.4 85.5  PLT 370   < > 202 162   < > = values in this interval not displayed.   Basic Metabolic Panel Recent Labs    01/24/23 1618 01/25/23 0501 01/26/23 0410  NA 131* 133* 136  K 4.4 4.1 3.7  CL 98 98 100  CO2 '25 28 28  '$ GLUCOSE 107* 85 87  BUN 6* 9 11  CREATININE 0.85 0.81 0.82  CALCIUM 8.6* 8.4* 8.5*  MG 2.2  --  2.4   Liver Function Tests No results for input(s): "AST", "ALT", "ALKPHOS", "BILITOT", "PROT", "ALBUMIN" in the last 72 hours. No results for input(s): "LIPASE", "AMYLASE" in the last 72 hours. Cardiac Enzymes No results for input(s): "CKTOTAL", "CKMB", "CKMBINDEX", "TROPONINI" in the last 72 hours.  BNP: BNP (last 3 results) Recent Labs    01/19/23 1543  BNP 897.6*    ProBNP (last 3 results) No results for input(s): "PROBNP" in the last 8760 hours.   D-Dimer No results for input(s): "DDIMER" in the last 72 hours. Hemoglobin A1C No results for input(s): "HGBA1C" in the last 72 hours. Fasting Lipid Panel No  results for input(s): "CHOL", "HDL", "LDLCALC", "TRIG", "CHOLHDL", "LDLDIRECT" in the last 72 hours. Thyroid Function Tests No results for input(s): "TSH", "T4TOTAL", "T3FREE", "THYROIDAB" in the last 72 hours.  Invalid input(s): "FREET3"  Other results:   Imaging    No results found.   Medications:     Scheduled Medications:  acetaminophen  1,000 mg Oral Q6H   Or   acetaminophen (TYLENOL) oral liquid 160 mg/5 mL  1,000 mg Per Tube Q6H   allopurinol  300 mg Oral Daily   aspirin EC  325 mg Oral Daily   Or   aspirin  324 mg Per Tube Daily   atorvastatin  80 mg Oral Daily   bisacodyl  10 mg Oral Daily   Or   bisacodyl  10 mg Rectal Daily   Chlorhexidine Gluconate Cloth  6 each Topical Daily   citalopram  20 mg Oral Daily   cycloSPORINE  1 drop Both Eyes Daily   docusate sodium  200 mg Oral Daily    empagliflozin  10 mg Oral Daily   enoxaparin (LOVENOX) injection  40 mg Subcutaneous QHS   insulin aspart  0-24 Units Subcutaneous TID WC   pantoprazole  40 mg Oral Daily   potassium chloride  20 mEq Oral Q4H   sodium chloride flush  3 mL Intravenous Q12H   spironolactone  12.5 mg Oral Daily    Infusions:  sodium chloride Stopped (01/24/23 0009)   sodium chloride     sodium chloride 10 mL/hr at 01/26/23 0600   lactated ringers Stopped (01/25/23 0039)   lactated ringers 10 mL/hr at 01/26/23 0600    PRN Medications: sodium chloride, ALPRAZolam, metoprolol tartrate, ondansetron (ZOFRAN) IV, oxyCODONE, sodium chloride flush, traMADol    Patient Profile   67 y/o male w/ bicuspid AoV, HTN, T2DM and prior TIA admitted w/ NSTEMI. Found to have systolic heart failure, 2V CAD and severe AS. Now s/p CABG + SAVR. AHF team asked to assist w/ post op management of systolic heart failure.   Assessment/Plan   1. CAD: s/p NSTEMI. Cath with CTO RCA with collaterals from left + high grade pLAD.  Now s/p CABG with LIMA-LAD.  Unable to graft RCA as too calcified.  - Continue ASA - Continue statin.  - Continue mobilize.  2. Aortic stenosis: Severe bicuspid AS.  Normal caliber thoracic aorta.  Now s/p bioprosthetic AVR.   3. Cardiogenic shock: Ischemic cardiomyopathy, pre-op echo with EF 30-35%.  He is now s/p LIMA-LAD + bioprosthetic AVR.   -Now off milrinone. CO-OX 60%.  - CVP 6-7. Volume status stable.  - Increase spiro to 25 mg daily  - Continue jardiance 10 mg daily  - Renal function stable.    Looks great.   Length of Stay: [place], [place]  01/26/2023, 8:31 AM  Advanced Heart Failure Team Pager [phone] (M-F; 7a - 5p)  Please contact [facility] for night-coverage after hours (5p -7a ) and weekends on [email or web address]  8:31 AM  Patient seen with NP, agree with the above note.   Co-ox 60% with CVP 6.  No complaints, walking in halls.   General: NAD Neck: No JVD, no thyromegaly or  thyroid nodule.  Lungs: Clear to auscultation bilaterally with normal respiratory effort. CV: Nondisplaced PMI.  Heart regular S1/S2, no S3/S4, no murmur.  No peripheral edema.   Abdomen: Soft, nontender, no hepatosplenomegaly, no distention.  Skin: Intact without lesions or rashes.  Neurologic: Alert and oriented x 3.  Psych: Normal affect.  Extremities: No clubbing or cyanosis.  HEENT: Normal.   Doing great.  Will stop milrinone today and repeat co-ox.  If stable, can go to step down.    Will increase spironolactone to 25 and continue Jardiance.  Can add ARB/ARNI tomorrow if BP stable (relatively soft today).  Creatinine 0.8.   Loralie Champagne 01/26/2023 9:44 AM

## 2023-01-26 NOTE — Progress Notes (Signed)
     [address]       [place] [address]             [phone]       EVENING ROUNDS  Doing well. Ambulating Off milrinone with Coox of 50s Discussed with AHF, they are not concerned Hopefully to floor tomorrow

## 2023-01-26 NOTE — Plan of Care (Signed)
  Problem: Cardiac: Goal: Will achieve and/or maintain hemodynamic stability Outcome: Progressing   Problem: Respiratory: Goal: Respiratory status will improve Outcome: Not Progressing  PT non-compliant with Problem: Urinary Elimination: Goal: Ability to achieve and maintain adequate renal perfusion and functioning will improve Outcome: Progressing   Problem: Activity: Goal: Risk for activity intolerance will decrease Outcome: Progressing   IS usage despite education on importance

## 2023-01-26 NOTE — Progress Notes (Signed)
Pt educated on the importance of using the IS. Pt unwilling to do more than 1 rep. Pt remains on room air with oxygen saturations 91%-93%. Will continue to monitor respiratory status and encourage use of IS.

## 2023-01-26 NOTE — Progress Notes (Addendum)
TCTS DAILY ICU PROGRESS NOTE                   [address]            [place], [address]          [phone]   3 Days Post-Op Procedure(s) (LRB): CORONARY ARTERY BYPASS GRAFTING (CABG) 1 TIMES  USING LEFT INTERNAL MAMMARY ARTERY,  RIGHT SAPHENOUS LEG VEIN HARVESTED ENDOSCOPICALLY (N/A) AORTIC VALVE REPLACEMENT (AVR) USING 27 INSPIRIS VALVE (N/A) TRANSESOPHAGEAL ECHOCARDIOGRAM (N/A)  Total Length of Stay:  LOS: 7 days   Subjective: Minor pain c/o  Objective: Vital signs in last 24 hours: Temp:  [97.5 F (36.4 C)-99.1 F (37.3 C)] 99.1 F (37.3 C) (02/02 0400) Pulse Rate:  [85-105] 86 (02/01 2200) Cardiac Rhythm: Normal sinus rhythm (02/02 0002) Resp:  [12-26] 12 (02/02 0700) BP: (88-151)/(44-79) 116/77 (02/02 0700) SpO2:  [90 %-100 %] 92 % (02/02 0400) Weight:  [78.1 kg] 78.1 kg (02/02 0635)  Filed Weights   01/24/23 0500 01/25/23 0600 01/26/23 0635  Weight: 75.4 kg 80.7 kg 78.1 kg    Weight change: -2.6 kg   Hemodynamic parameters for last 24 hours: CVP:  [2 mmHg-14 mmHg] 14 mmHg  Intake/Output from previous day: 02/01 0701 - 02/02 0700 In: 458.3 [P.O.:120; I.V.:338.3] Out: 5200 [Urine:5200]  Intake/Output this shift: No intake/output data recorded.  Current Meds: Scheduled Meds:  acetaminophen  1,000 mg Oral Q6H   Or   acetaminophen (TYLENOL) oral liquid 160 mg/5 mL  1,000 mg Per Tube Q6H   allopurinol  300 mg Oral Daily   aspirin EC  325 mg Oral Daily   Or   aspirin  324 mg Per Tube Daily   atorvastatin  80 mg Oral Daily   bisacodyl  10 mg Oral Daily   Or   bisacodyl  10 mg Rectal Daily   Chlorhexidine Gluconate Cloth  6 each Topical Daily   citalopram  20 mg Oral Daily   cycloSPORINE  1 drop Both Eyes Daily   docusate sodium  200 mg Oral Daily   empagliflozin  10 mg Oral Daily   enoxaparin (LOVENOX) injection  40 mg Subcutaneous QHS   insulin aspart  0-24 Units Subcutaneous TID WC   insulin detemir  10 Units Subcutaneous Daily    pantoprazole  40 mg Oral Daily   potassium chloride  20 mEq Oral Q4H   sodium chloride flush  3 mL Intravenous Q12H   spironolactone  12.5 mg Oral Daily   Continuous Infusions:  sodium chloride Stopped (01/24/23 0009)   sodium chloride     sodium chloride 10 mL/hr at 01/26/23 0600   lactated ringers Stopped (01/25/23 0039)   lactated ringers 10 mL/hr at 01/26/23 0600   milrinone 0.125 mcg/kg/min (01/26/23 0600)   PRN Meds:.sodium chloride, ALPRAZolam, metoprolol tartrate, morphine injection, ondansetron (ZOFRAN) IV, oxyCODONE, sodium chloride flush, traMADol  General appearance: alert, cooperative, and no distress Heart: regular rate and rhythm Lungs: dim l>R base Abdomen: benign Extremities: no edema Wound: dressing with minor old drainage  Lab Results: CBC: Recent Labs    01/24/23 1618 01/25/23 0501  WBC 12.7* 11.0*  HGB 10.7* 9.4*  HCT 32.1* 28.8*  PLT 202 162   BMET:  Recent Labs    01/25/23 0501 01/26/23 0410  NA 133* 136  K 4.1 3.7  CL 98 100  CO2 28 28  GLUCOSE 85 87  BUN 9 11  CREATININE 0.81 0.82  CALCIUM 8.4* 8.5*  CMET: Lab Results  Component Value Date   WBC 11.0 (H) 01/25/2023   HGB 9.4 (L) 01/25/2023   HCT 28.8 (L) 01/25/2023   PLT 162 01/25/2023   GLUCOSE 87 01/26/2023   ALT 20 01/19/2023   AST 34 01/19/2023   NA 136 01/26/2023   K 3.7 01/26/2023   CL 100 01/26/2023   CREATININE 0.82 01/26/2023   BUN 11 01/26/2023   CO2 28 01/26/2023   TSH 1.447 01/19/2023   INR 1.3 (H) 01/23/2023   HGBA1C 5.4 01/19/2023      PT/INR:  Recent Labs    01/23/23 1353  LABPROT 16.0*  INR 1.3*   Radiology: No results found.   Assessment/Plan: S/P Procedure(s) (LRB): CORONARY ARTERY BYPASS GRAFTING (CABG) 1 TIMES  USING LEFT INTERNAL MAMMARY ARTERY,  RIGHT SAPHENOUS LEG VEIN HARVESTED ENDOSCOPICALLY (N/A) AORTIC VALVE REPLACEMENT (AVR) USING 27 INSPIRIS VALVE (N/A) TRANSESOPHAGEAL ECHOCARDIOGRAM (N/A) POD#3  1 afebrile, S bp 88-151,  sinus rhythm, PVC's, short burst of VTACH, Co-ox 60, milrinone 0.125, AHF team assisting with management, prob d/c today- poss start some beta blocker 2 sats good on RA 3 excellent UOP, weight at preop 4 BS well controlled 5 normal renal fxn 6 cont pulm hygiene and rehab, tx to floor when off milrinone     Raymond Roberson 01/26/2023 7:29 AM   Chart reviewed, patient examined, agree with above. He looks great and feels well. Says his breathing and stamina are much better than they have been in years. Co-ox 60.4 early this am on milrinone 0.125. Will stop it and repeat Co-ox 2 hrs later and DC sleeve after that. Then he can go to 4E. Diuresed -4700 cc yesterday. I don't think he needs any further diuresis. Plan home Sunday if no changes.

## 2023-01-27 ENCOUNTER — Other Ambulatory Visit (HOSPITAL_BASED_OUTPATIENT_CLINIC_OR_DEPARTMENT_OTHER): Payer: Self-pay

## 2023-01-27 LAB — CBC
HCT: 32.5 % — ABNORMAL LOW (ref 39.0–52.0)
Hemoglobin: 10.1 g/dL — ABNORMAL LOW (ref 13.0–17.0)
MCH: 26.7 pg (ref 26.0–34.0)
MCHC: 31.1 g/dL (ref 30.0–36.0)
MCV: 86 fL (ref 80.0–100.0)
Platelets: 294 10*3/uL (ref 150–400)
RBC: 3.78 MIL/uL — ABNORMAL LOW (ref 4.22–5.81)
RDW: 12.9 % (ref 11.5–15.5)
WBC: 8.1 10*3/uL (ref 4.0–10.5)
nRBC: 0 % (ref 0.0–0.2)

## 2023-01-27 LAB — BASIC METABOLIC PANEL
Anion gap: 11 (ref 5–15)
BUN: 13 mg/dL (ref 8–23)
CO2: 26 mmol/L (ref 22–32)
Calcium: 9.1 mg/dL (ref 8.9–10.3)
Chloride: 98 mmol/L (ref 98–111)
Creatinine, Ser: 0.86 mg/dL (ref 0.61–1.24)
GFR, Estimated: >60 mL/min (ref >60)
Glucose, Bld: 81 mg/dL (ref 70–99)
Potassium: 4.1 mmol/L (ref 3.5–5.1)
Sodium: 135 mmol/L (ref 135–145)

## 2023-01-27 LAB — MAGNESIUM: Magnesium: 2.4 mg/dL (ref 1.7–2.4)

## 2023-01-27 MED ORDER — EMPAGLIFLOZIN 10 MG PO TABS
10.0000 mg | ORAL_TABLET | Freq: Every day | ORAL | 3 refills | Status: DC
  Filled 2023-01-27: qty 30, 30d supply, fill #0

## 2023-01-27 MED ORDER — ATORVASTATIN CALCIUM 80 MG PO TABS: 80.0000 mg | ORAL_TABLET | Freq: Every day | ORAL | 3 refills | Status: DC

## 2023-01-27 MED ORDER — SPIRONOLACTONE 25 MG PO TABS: 25.0000 mg | ORAL_TABLET | Freq: Every day | ORAL | 3 refills | Status: DC

## 2023-01-27 MED ORDER — LOSARTAN POTASSIUM 25 MG PO TABS: 25.0000 mg | ORAL_TABLET | Freq: Every day | ORAL | 3 refills | Status: DC

## 2023-01-27 NOTE — Progress Notes (Signed)
Advanced Heart Failure Rounding Note  PCP-Cardiologist: None    Patient Profile   67 y/o male w/ bicuspid AoV, HTN, T2DM and prior TIA admitted w/ NSTEMI. Found to have systolic heart failure, 2V CAD and severe AS. Now s/p CABG + SAVR. AHF team asked to assist w/ post op management of systolic heart failure. Pre-op echo with EF 30-35%, RV mildly reduced.   Subjective:    Frustrated this am because his chest and back are sore and says he is not getting enough pain meds.   Denies CP or SOB. Walking around the room   BP stable off milrinone  Objective:   Weight Range: 77.7 kg Body mass index is 23.23 kg/m.   Vital Signs:   Temp:  [98 F (36.7 C)] 98 F (36.7 C) (02/02 1900) Resp:  [11-31] 20 (02/03 0800) BP: (112-144)/(53-87) 138/81 (02/02 2123) SpO2:  [94 %-95 %] 94 % (02/02 1600) Weight:  [77.7 kg] 77.7 kg (02/03 0500) Last BM Date : 01/22/23  Weight change: Filed Weights   01/25/23 0600 01/26/23 0635 01/27/23 0500  Weight: 80.7 kg 78.1 kg 77.7 kg    Intake/Output:   Intake/Output Summary (Last 24 hours) at 01/27/2023 0947 Last data filed at 01/26/2023 2000 Gross per 24 hour  Intake --  Output 2125 ml  Net -2125 ml       Physical Exam    General:  Well appearing. No resp difficulty HEENT: normal Neck: supple. no JVD. Carotids 2+ bilat; no bruits. No lymphadenopathy or thryomegaly appreciated. Cor: Sternal wound ok  Regular rate & rhythm. No rubs, gallops or murmurs. Lungs: clear Abdomen: soft, nontender, nondistended. No hepatosplenomegaly. No bruits or masses. Good bowel sounds. Extremities: no cyanosis, clubbing, rash, edema Neuro: alert & orientedx3, cranial nerves grossly intact. moves all 4 extremities w/o difficulty. Affect pleasant   Telemetry   SR w80s Personally reviewed   Labs    CBC Recent Labs    01/25/23 0501 01/27/23 0647  WBC 11.0* 8.1  HGB 9.4* 10.1*  HCT 28.8* 32.5*  MCV 85.5 86.0  PLT 162 096    Basic Metabolic  Panel Recent Labs    01/24/23 1618 01/25/23 0501 01/26/23 0410  NA 131* 133* 136  K 4.4 4.1 3.7  CL 98 98 100  CO2 '25 28 28  '$ GLUCOSE 107* 85 87  BUN 6* 9 11  CREATININE 0.85 0.81 0.82  CALCIUM 8.6* 8.4* 8.5*  MG 2.2  --  2.4    Liver Function Tests No results for input(s): "AST", "ALT", "ALKPHOS", "BILITOT", "PROT", "ALBUMIN" in the last 72 hours. No results for input(s): "LIPASE", "AMYLASE" in the last 72 hours. Cardiac Enzymes No results for input(s): "CKTOTAL", "CKMB", "CKMBINDEX", "TROPONINI" in the last 72 hours.  BNP: BNP (last 3 results) Recent Labs    01/19/23 1543  BNP 897.6*     ProBNP (last 3 results) No results for input(s): "PROBNP" in the last 8760 hours.   D-Dimer No results for input(s): "DDIMER" in the last 72 hours. Hemoglobin A1C No results for input(s): "HGBA1C" in the last 72 hours. Fasting Lipid Panel No results for input(s): "CHOL", "HDL", "LDLCALC", "TRIG", "CHOLHDL", "LDLDIRECT" in the last 72 hours. Thyroid Function Tests No results for input(s): "TSH", "T4TOTAL", "T3FREE", "THYROIDAB" in the last 72 hours.  Invalid input(s): "FREET3"  Other results:   Imaging    No results found.   Medications:     Scheduled Medications:  acetaminophen  1,000 mg Oral Q6H   Or  acetaminophen (TYLENOL) oral liquid 160 mg/5 mL  1,000 mg Per Tube Q6H   allopurinol  300 mg Oral Daily   aspirin EC  325 mg Oral Daily   Or   aspirin  324 mg Per Tube Daily   atorvastatin  80 mg Oral Daily   bisacodyl  10 mg Oral Daily   Or   bisacodyl  10 mg Rectal Daily   Chlorhexidine Gluconate Cloth  6 each Topical Daily   citalopram  20 mg Oral Daily   cycloSPORINE  1 drop Both Eyes Daily   docusate sodium  200 mg Oral Daily   empagliflozin  10 mg Oral Daily   pantoprazole  40 mg Oral Daily   sodium chloride flush  3 mL Intravenous Q12H   spironolactone  25 mg Oral Daily    Infusions:    PRN Medications: ALPRAZolam, metoprolol tartrate,  ondansetron (ZOFRAN) IV, oxyCODONE, sodium chloride flush, traMADol    Patient Profile   67 y/o male w/ bicuspid AoV, HTN, T2DM and prior TIA admitted w/ NSTEMI. Found to have systolic heart failure, 2V CAD and severe AS. Now s/p CABG + SAVR. AHF team asked to assist w/ post op management of systolic heart failure.   Assessment/Plan   1. CAD: s/p NSTEMI. Cath with CTO RCA with collaterals from left + high grade pLAD.  Now s/p CABG with LIMA-LAD.  Unable to graft RCA as too calcified.  - No s/s angina - Continue ASA - Continue statin.  - Continue mobilize.   2. Aortic stenosis: Severe bicuspid AS.  Normal caliber thoracic aorta.  Now s/p bioprosthetic AVR.    3. Acute systolic HF -> Cardiogenic shock: Ischemic cardiomyopathy, pre-op echo with EF 30-35%.  He is now s/p LIMA-LAD + bioprosthetic AVR.   - Now off milrinone. - Volume status stable.  - Continue spiro to 25 mg daily  - Continue jardiance 10 mg daily  - Renal function stable.  -> Add losartan 25 - No b-blocker yet with recent shock   He is signing out AMA. I d/w TCTS  Stable from HF standpoint.   Can d/c on above HF meds. Would not start b-blocker until outpatient due to recent shock.   Length of Stay: [place], MD  01/27/2023, 9:42 AM  Advanced Heart Failure Team Pager [phone] (M-F; 7a - 5p)  Please contact [facility] for night-coverage after hours (5p -7a ) and weekends on [email or web address]  9:42 AM

## 2023-01-27 NOTE — Progress Notes (Signed)
      [address]       [place] [address]             [phone]      4 Days Post-Op  Procedure(s) (LRB): CORONARY ARTERY BYPASS GRAFTING (CABG) 1 TIMES  USING LEFT INTERNAL MAMMARY ARTERY,  RIGHT SAPHENOUS LEG VEIN HARVESTED ENDOSCOPICALLY (N/A) AORTIC VALVE REPLACEMENT (AVR) USING 27 INSPIRIS VALVE (N/A) TRANSESOPHAGEAL ECHOCARDIOGRAM (N/A)  Total Length of Stay:  LOS: 8 days   SUBJECTIVE: Wants to go home. Became frustrated last night and had higher blood pressures Feels better this am.  Ambulated all morning so far  Vitals:   01/27/23 0500 01/27/23 0600  BP:    Pulse:    Resp: 16 (!) 24  Temp:    SpO2:      Intake/Output      02/02 0701 02/03 0700 02/03 0701 02/04 0700   P.O. 400    I.V. (mL/kg) 35.5 (0.5)    Total Intake(mL/kg) 435.5 (5.6)    Urine (mL/kg/hr) 2125 (1.1)    Total Output 2125    Net -1689.6             sodium chloride Stopped (01/24/23 0009)   sodium chloride     sodium chloride Stopped (01/26/23 1757)   lactated ringers Stopped (01/25/23 0039)   lactated ringers Stopped (01/26/23 1757)    CBC    Component Value Date/Time   WBC 11.0 (H) 01/25/2023 0501   RBC 3.37 (L) 01/25/2023 0501   HGB 9.4 (L) 01/25/2023 0501   HCT 28.8 (L) 01/25/2023 0501   PLT 162 01/25/2023 0501   MCV 85.5 01/25/2023 0501   MCH 27.9 01/25/2023 0501   MCHC 32.6 01/25/2023 0501   RDW 12.7 01/25/2023 0501   LYMPHSABS 0.5 (L) 01/23/2023 1915   MONOABS 0.9 01/23/2023 1915   EOSABS 0.0 01/23/2023 1915   BASOSABS 0.0 01/23/2023 1915   CMP     Component Value Date/Time   NA 136 01/26/2023 0410   K 3.7 01/26/2023 0410   CL 100 01/26/2023 0410   CO2 28 01/26/2023 0410   GLUCOSE 87 01/26/2023 0410   BUN 11 01/26/2023 0410   CREATININE 0.82 01/26/2023 0410   CALCIUM 8.5 (L) 01/26/2023 0410   PROT 6.4 (L) 01/19/2023 1543   ALBUMIN 3.3 (L) 01/19/2023 1543   AST 34 01/19/2023 1543   ALT 20 01/19/2023 1543   ALKPHOS 41 01/19/2023 1543    BILITOT 1.0 01/19/2023 1543   GFRNONAA >60 01/26/2023 0410   ABG    Component Value Date/Time   PHART 7.368 01/23/2023 2024   PCO2ART 41.6 01/23/2023 2024   PO2ART 89 01/23/2023 2024   HCO3 23.8 01/23/2023 2024   TCO2 25 01/23/2023 2024   ACIDBASEDEF 1.0 01/23/2023 2024   O2SAT 53.4 01/26/2023 1016   CBG (last 3)  Recent Labs    01/26/23 0754 01/26/23 1125 01/26/23 1517  GLUCAP 104* 94 89  EXAM Lungs:clear Card: RR Incisions clean Ext: warm and dry   ASSESSMENT: POD #4 SP AVR/CABG Doing well Transfer to floor DC to home soon   Coralie Common, MD 01/27/2023

## 2023-01-27 NOTE — Progress Notes (Signed)
AVS reviewed with pt and his significant other.  Pt agreed to take all AM medications.  He then proceeded to take PRN pain medication, and throw the remaining medications in the trash can.  MAR updated to reflect this.

## 2023-01-27 NOTE — Progress Notes (Signed)
Pt agreeable to light education. Discussed sternal precautions, IS, walking as tolerated, watching for signs of heart failure. Pt somewhat receptive. Gave [facility] booklet and encouraged to read. Will place referral for [facility].  [id number] Yves Dill BS, ACSM-CEP 01/27/2023 10:50 AM

## 2023-01-27 NOTE — H&P (Deleted)
      [address]       [place] [address]             [phone]        Patient for discharge home today.  Of note patient is upset that he will not be provided a pain medication script on discharge.  He had 120 tablets of Norco 5/325 mg filled on 12/21/2022 and again on 01/03/2023.  This is adequate post operative pain medication control.  He can follow up with pain management physician should he need additional refills.   Ellwood Handler, PA-C 9:38 AM 01/27/23

## 2023-01-27 NOTE — Progress Notes (Signed)
      [address]       [place] [address]             [phone]       Patient wishes to leave AMA.  Spoke with Dr. Lavonna Monarch who felt patient was stable for discharge.  Of note patient is upset that he will not be provided a pain medication script on discharge.  He had 120 tablets of Norco 5/325 mg filled on 12/21/2022 and again on 01/03/2023. (240 tablets filled in a 2 week period).  This is adequate post operative pain medication control.   He can follow up with pain management physician should he need additional refills.  Ellwood Handler, PA-C 9:42 AM 01/27/23

## 2023-01-29 ENCOUNTER — Telehealth: Payer: Self-pay | Admitting: *Deleted

## 2023-01-29 ENCOUNTER — Other Ambulatory Visit (HOSPITAL_BASED_OUTPATIENT_CLINIC_OR_DEPARTMENT_OTHER): Payer: Self-pay

## 2023-01-29 NOTE — Telephone Encounter (Signed)
Patient contacted the office requesting pain medication s/p CABG 1/30 by Dr. Cyndia Bent. Patient states he had a telephone call from a physician who stated he needed to call our office for pain medication. Per patient, he was not provided medication when he left the hospital. Advised patient, per E. Barrett, PA's, note from 2/3 that patient needs to contact pain management MD for pain control medications, Dr. Gar Ponto. Patient states he will give his office a call.

## 2023-02-02 MED FILL — Potassium Chloride Inj 2 mEq/ML: INTRAVENOUS | Qty: 40 | Status: AC

## 2023-02-02 MED FILL — Heparin Sodium (Porcine) Inj 1000 Unit/ML: INTRAMUSCULAR | Qty: 10 | Status: AC

## 2023-02-02 MED FILL — Mannitol IV Soln 20%: INTRAVENOUS | Qty: 500 | Status: AC

## 2023-02-02 MED FILL — Heparin Sodium (Porcine) Inj 1000 Unit/ML: INTRAMUSCULAR | Qty: 30 | Status: AC

## 2023-02-02 MED FILL — Heparin Sodium (Porcine) Inj 1000 Unit/ML: Qty: 1000 | Status: AC

## 2023-02-02 MED FILL — Electrolyte-R (PH 7.4) Solution: INTRAVENOUS | Qty: 3000 | Status: AC

## 2023-02-02 MED FILL — Lidocaine HCl Local Preservative Free (PF) Inj 2%: INTRAMUSCULAR | Qty: 14 | Status: AC

## 2023-02-02 MED FILL — Sodium Bicarbonate IV Soln 8.4%: INTRAVENOUS | Qty: 50 | Status: AC

## 2023-02-09 NOTE — Progress Notes (Deleted)
Cardiology Clinic Note   Patient Name: ABRAAM TURTURRO Date of Encounter: 02/09/2023  Primary Care Provider:  Caryl Bis, MD Primary Cardiologist:  None  Patient Profile    ZAVIYAR VIRELLA 67 year old male presents to the clinic today for follow-up evaluation of his coronary artery disease. Past Medical History    Past Medical History:  Diagnosis Date   Acute angina (HCC)    Bicuspid aortic valve    Chronic alcohol abuse    Classic migraine 09/02/2013   CVA (cerebral vascular accident) ([place])    Diabetes mellitus without complication ([place])    Dizziness and giddiness 09/02/2013   GERD (gastroesophageal reflux disease)    Gouty arthritis    Hypertension    Major depression    Other and unspecified hyperlipidemia    Peptic ulcer disease    Sleep apnea    Past Surgical History:  Procedure Laterality Date   AORTIC VALVE REPLACEMENT N/A 01/23/2023   Procedure: AORTIC VALVE REPLACEMENT (AVR) USING 27 INSPIRIS VALVE;  Surgeon: Gaye Pollack, MD;  Location: [facility];  Service: Open Heart Surgery;  Laterality: N/A;   BIOPSY  07/21/2022   Procedure: BIOPSY;  Surgeon: Harvel Quale, MD;  [facility];  Service: Gastroenterology;;   CIRCUMCISION     COLONOSCOPY WITH PROPOFOL N/A 07/21/2022   Procedure: COLONOSCOPY WITH PROPOFOL;  Surgeon: Harvel Quale, MD;  [facility];  Service: Gastroenterology;  Laterality: N/A;  11:15 ASA 2   CORONARY ARTERY BYPASS GRAFT N/A 01/23/2023   Procedure: CORONARY ARTERY BYPASS GRAFTING (CABG) 1 TIMES  USING LEFT INTERNAL MAMMARY ARTERY,  RIGHT SAPHENOUS LEG VEIN HARVESTED ENDOSCOPICALLY;  Surgeon: Gaye Pollack, MD;  Location: [facility];  Service: Open Heart Surgery;  Laterality: N/A;   CYST REMOVAL TRUNK  11/24/2012   back   INGUINAL HERNIA REPAIR Bilateral    POLYPECTOMY  07/21/2022   Procedure: POLYPECTOMY;  Surgeon: Harvel Quale, MD;  [facility];  Service:  Gastroenterology;;   RHINOPLASTY     RIGHT HEART CATH AND CORONARY ANGIOGRAPHY N/A 01/22/2023   Procedure: RIGHT HEART CATH AND CORONARY ANGIOGRAPHY;  Surgeon: Lorretta Harp, MD;  Location: [facility];  Service: Cardiovascular;  Laterality: N/A;   TEE WITHOUT CARDIOVERSION N/A 01/23/2023   Procedure: TRANSESOPHAGEAL ECHOCARDIOGRAM;  Surgeon: Gaye Pollack, MD;  Location: [facility];  Service: Open Heart Surgery;  Laterality: N/A;    Allergies  Allergies  Allergen Reactions   Naproxen Nausea Only   Sulfa Antibiotics Nausea Only    History of Present Illness    ZASHAWN CONKEY is a PMH of coronary artery disease, NSTEMI, severe aortic stenosis, acute on chronic systolic CHF, HTN, HLD, and major depression.  His PMH also includes GERD, gout, type 2 diabetes, anxiety, chronic pain, tobacco use, and history of alcohol abuse.  He has a family history of coronary disease with a father died in his 104s.  He was admitted to the hospital on 01/19/2023 and discharged on 01/27/2023.  He presented to emergency department in [place] with complaints of chest pain and shortness of breath.  His cardiac workup showed an elevated troponin.  Admission was recommended but he left AMA.  He presented to [facility] on 01/19/2023.  He reported 3-4 days of shortness of breath and chest discomfort.  His symptoms were mainly exertional.  He is disabled due to his back.  He reported a 40 pound weight loss in the last 6  months.  He underwent left heart catheterization and was noted to have multivessel disease.  Cardiothoracic surgery was consulted.  He was taken to the operating room 01/23/2023 and underwent CABG x 1.  He also received aortic valve replacement.  His surgery was uncomplicated.  He was taken off from cardiopulmonary bypass without complication.  He required milrinone and epinephrine.  His vasopressors were weaned off and he was weaned on the ventilator.  Postoperative his ejection fraction was noted to be  30-35%.  The advanced heart failure team was consulted for management and maximizing of his GDMT.  He received IV diuresis and responded well.  He maintained sinus rhythm.  Plan was made to continue increasing GDMT with addition of beta-blocker as an outpatient.  His surgical incisions continue to heal well without signs of infection and he was discharged home in stable condition on 01/27/2023.  He presents to the clinic today for follow-up evaluation and states***  *** denies chest pain, shortness of breath, lower extremity edema, fatigue, palpitations, melena, hematuria, hemoptysis, diaphoresis, weakness, presyncope, syncope, orthopnea, and PND.  Coronary artery disease-underwent cardiac catheterization and was noted to have 100% ostial RCA and proximal LAD-mid LAD 90% stenosis.  Cardiothoracic surgery was consulted and he underwent CABG x 1 on 01/23/2023.  He also received aortic valve replacement.  He maintained sinus rhythm.  His EF postoperatively was noted to be 30-35%.  He was weaned off of epinephrine and milrinone.  He was started on GDMT.  Advanced heart failure team was consulted. Continue aspirin, atorvastatin, Jardiance, losartan, spironolactone Heart healthy low-sodium diet-salty 6 given Increase physical activity as tolerated  Systolic and diastolic CHF-slowly increasing physical activity.  No increased DOE or activity intolerance.  Limited in physical activity due to bilateral knee pain.  Echocardiogram 01/20/2023 showed an LVEF of 30-35% and G3 DD. Continue losartan, Jardiance, spironolactone Heart healthy low-sodium diet-salty 6 given Increase physical activity as tolerated Start carvedilol 3.25 mg twice daily  Severe aortic valve stenosis-status post aortic valve replacement by Dr. Cyndia Bent on 01/23/2023.  Continues to increase physical activity slowly.  Maintaining sternal precautions. Follows with cardiothoracic surgery  Hyperlipidemia-LDL*** Continue aspirin, atorvastatin Heart  healthy low-sodium diet-salty 6 given Increase physical activity as tolerated Repeat fasting lipids and LFTs in 8 weeks  Essential hypertension-BP today*** Continue spironolactone, losartan Heart healthy low-sodium diet-salty 6 given Increase physical activity as tolerated  Disposition: Follow-up with Dr. Gwenlyn Found or me in 1 month.  Home Medications    Prior to Admission medications   Medication Sig Start Date End Date Taking? Authorizing Provider  ALPRAZolam Duanne Moron) 0.5 MG tablet Take 0.5 mg by mouth 2 (two) times daily.    [provider]  aspirin 81 MG tablet Take 81 mg by mouth daily.    [provider]  Aspirin-Acetaminophen (GOODYS BODY PAIN PO) Take 1 Package by mouth daily as needed (pain/out of aspirin).    [provider]  atorvastatin (LIPITOR) 80 MG tablet Take 1 tablet (80 mg total) by mouth daily. 01/27/23   Barrett, Erin R, PA-C  citalopram (CELEXA) 40 MG tablet Take 20 mg by mouth daily.    [provider]  cycloSPORINE (RESTASIS) 0.05 % ophthalmic emulsion Place 1 drop into both eyes daily. Patient not taking: Reported on 01/21/2023    [provider]  empagliflozin (JARDIANCE) 10 MG TABS tablet Take 1 tablet (10 mg total) by mouth daily. 01/27/23   Barrett, Erin R, PA-C  esomeprazole (NEXIUM) 40 MG capsule Take 40 mg by mouth  daily. 04/27/22   [provider]  HYDROcodone-acetaminophen (NORCO/VICODIN) 5-325 MG tablet Take 1 tablet by mouth 4 (four) times daily.    [provider]  hydrocortisone (ANUSOL-HC) 2.5 % rectal cream Place 1 application  rectally daily as needed for hemorrhoids or anal itching.    [provider]  levocetirizine (XYZAL) 5 MG tablet Take 5 mg by mouth daily. Patient not taking: Reported on 01/21/2023 04/27/22   [provider]  losartan (COZAAR) 25 MG tablet Take 1 tablet (25 mg total) by mouth daily. 01/27/23 01/27/24  Barrett, Lodema Hong, PA-C  Multiple Vitamins-Minerals (MULTIVITAMIN  WITH MINERALS) tablet Take 1 tablet by mouth daily.    [provider]  ondansetron (ZOFRAN) 8 MG tablet Take 8 mg by mouth once a week. 04/10/22   [provider]  sildenafil (VIAGRA) 100 MG tablet Take 100 mg by mouth daily as needed for erectile dysfunction. 01/17/23   [provider]  spironolactone (ALDACTONE) 25 MG tablet Take 1 tablet (25 mg total) by mouth daily. 01/27/23   Barrett, Lodema Hong, PA-C    Family History    Family History  Problem Relation Age of Onset   Allergies Mother    Anxiety disorder Mother    CAD Father    Heart failure Father    He indicated that his mother is alive. He indicated that his father is deceased.  Social History    Social History   Socioeconomic History   Marital status: Widowed    Spouse name: Not on file   Number of children: 2   Years of education: 9th   Highest education level: Not on file  Occupational History   Occupation: Retired Administrator  Tobacco Use   Smoking status: Never   Smokeless tobacco: Current    Types: Snuff  Substance and Sexual Activity   Alcohol use: Not Currently   Drug use: No   Sexual activity: Not on file  Other Topics Concern   Not on file  Social History Narrative   Not on file   Social Determinants of Health   Financial Resource Strain: Not on file  Food Insecurity: No Food Insecurity (01/20/2023)   Hunger Vital Sign    Worried About Running Out of Food in the Last Year: Never true    Ran Out of Food in the Last Year: Never true  Transportation Needs: No Transportation Needs (01/20/2023)   PRAPARE - Hydrologist (Medical): No    Lack of Transportation (Non-Medical): No  Physical Activity: Not on file  Stress: Not on file  Social Connections: Not on file  Intimate Partner Violence: Not At Risk (01/19/2023)   Humiliation, Afraid, Rape, and Kick questionnaire    Fear of Current or Ex-Partner: No    Emotionally Abused: No    Physically Abused:  No    Sexually Abused: No     Review of Systems    General:  No chills, fever, night sweats or weight changes.  Cardiovascular:  No chest pain, dyspnea on exertion, edema, orthopnea, palpitations, paroxysmal nocturnal dyspnea. Dermatological: No rash, lesions/masses Respiratory: No cough, dyspnea Urologic: No hematuria, dysuria Abdominal:   No nausea, vomiting, diarrhea, bright red blood per rectum, melena, or hematemesis Neurologic:  No visual changes, wkns, changes in mental status. All other systems reviewed and are otherwise negative except as noted above.  Physical Exam    VS:  There were no vitals taken for this visit. , BMI There is no  height or weight on file to calculate BMI. GEN: Well nourished, well developed, in no acute distress. HEENT: normal. Neck: Supple, no JVD, carotid bruits, or masses. Cardiac: RRR, no murmurs, rubs, or gallops. No clubbing, cyanosis, edema.  Radials/DP/PT 2+ and equal bilaterally.  Respiratory:  Respirations regular and unlabored, clear to auscultation bilaterally. GI: Soft, nontender, nondistended, BS + x 4. MS: no deformity or atrophy. Skin: warm and dry, no rash. Neuro:  Strength and sensation are intact. Psych: Normal affect.  Accessory Clinical Findings    Recent Labs: 01/19/2023: ALT 20; B Natriuretic Peptide 897.6; TSH 1.447 01/27/2023: BUN 13; Creatinine, Ser 0.86; Hemoglobin 10.1; Magnesium 2.4; Platelets 294; Potassium 4.1; Sodium 135   Recent Lipid Panel No results found for: "CHOL", "TRIG", "HDL", "CHOLHDL", "VLDL", "LDLCALC", "LDLDIRECT"  No BP recorded.  {Refresh Note OR Click here to enter BP  :1}***    ECG personally reviewed by me today- *** - No acute changes  Echocardiogram 01/20/2023 IMPRESSIONS     1. Calcified aortic valve with inderminant number of cusps; probable  severe AS with mean gradient as high as 37 mmHg; mild AI.   2. Left ventricular ejection fraction, by estimation, is 30 to 35%. The  left ventricle  has moderate to severely decreased function. The left  ventricle demonstrates global hypokinesis. The left ventricular internal  cavity size was moderately dilated.  Left ventricular diastolic parameters are consistent with Grade III  diastolic dysfunction (restrictive).   3. Right ventricular systolic function is mildly reduced. The right  ventricular size is normal.   4. The mitral valve is degenerative. Mild mitral valve regurgitation. No  evidence of mitral stenosis.   5. The aortic valve is calcified. Aortic valve regurgitation is mild.  Severe aortic valve stenosis. Aortic regurgitation PHT measures 220 msec.  Aortic valve area, by VTI measures 1.43 cm. Aortic valve mean gradient  measures 33.7 mmHg. Aortic valve Vmax   measures 3.63 m/s.   6. The inferior vena cava is dilated in size with >50% respiratory  variability, suggesting right atrial pressure of 8 mmHg.   FINDINGS   Left Ventricle: Left ventricular ejection fraction, by estimation, is 30  to 35%. The left ventricle has moderate to severely decreased function.  The left ventricle demonstrates global hypokinesis. Definity contrast  agent was given IV to delineate the  left ventricular endocardial borders. The left ventricular internal cavity  size was moderately dilated. There is no left ventricular hypertrophy.  Left ventricular diastolic parameters are consistent with Grade III  diastolic dysfunction (restrictive).   Right Ventricle: The right ventricular size is normal. Right ventricular  systolic function is mildly reduced.   Left Atrium: Left atrial size was normal in size.   Right Atrium: Right atrial size was normal in size.   Pericardium: There is no evidence of pericardial effusion.   Mitral Valve: The mitral valve is degenerative in appearance. Mild mitral  annular calcification. Mild mitral valve regurgitation. No evidence of  mitral valve stenosis.   Tricuspid Valve: The tricuspid valve is grossly  normal. Tricuspid valve  regurgitation is trivial. No evidence of tricuspid stenosis.   Aortic Valve: The aortic valve is calcified. Aortic valve regurgitation is  mild. Aortic regurgitation PHT measures 220 msec. Severe aortic stenosis  is present. Aortic valve mean gradient measures 33.7 mmHg. Aortic valve  peak gradient measures 52.8 mmHg.  Aortic valve area, by VTI measures 1.43 cm.   Pulmonic Valve: The pulmonic valve was grossly normal. Pulmonic valve  regurgitation is  trivial. No evidence of pulmonic stenosis.   Aorta: The aortic root and ascending aorta are structurally normal, with  no evidence of dilitation.   Venous: The inferior vena cava is dilated in size with greater than 50%  respiratory variability, suggesting right atrial pressure of 8 mmHg.   IAS/Shunts: No atrial level shunt detected by color flow Doppler.   Additional Comments: Calcified aortic valve with inderminant number of  cusps; probable severe AS with mean gradient as high as 37 mmHg; mild AI.       Cardiac catheterization 01/22/2023    Ost RCA lesion is 100% stenosed.   Prox LAD to Mid LAD lesion is 90% stenosed.   GERRON BIRELEY is a 67 y.o. male      VX:[id number] LOCATION:  FACILITY: [facility]  PHYSICIAN: Quay Burow, M.D. May 08, 1956  Diagnostic Dominance: Right  Intervention    Assessment & Plan   1.  ***   Jossie Ng. Keigen Caddell NP-C     02/09/2023, 7:35 AM [place] [address] Office [phone] Fax [phone]    I spent***minutes examining this patient, reviewing medications, and using patient centered shared decision making involving her cardiac care.  Prior to her visit I spent greater than 20 minutes reviewing her past medical history,  medications, and prior cardiac tests.

## 2023-02-12 ENCOUNTER — Ambulatory Visit: Payer: Medicare HMO | Attending: General Practice | Admitting: General Practice

## 2023-02-21 ENCOUNTER — Ambulatory Visit: Payer: Medicare HMO | Admitting: Surgery

## 2023-02-21 ENCOUNTER — Ambulatory Visit: Payer: Medicare HMO

## 2023-02-27 ENCOUNTER — Ambulatory Visit: Payer: Medicare HMO

## 2023-03-06 ENCOUNTER — Other Ambulatory Visit: Payer: Self-pay | Admitting: Surgery

## 2023-03-06 DIAGNOSIS — Z951 Presence of aortocoronary bypass graft: Secondary | ICD-10-CM

## 2023-03-06 NOTE — Progress Notes (Signed)
cxr 

## 2023-03-07 ENCOUNTER — Ambulatory Visit (HOSPITAL_COMMUNITY): Payer: Medicare HMO | Attending: Cardiovascular Disease

## 2023-03-07 ENCOUNTER — Ambulatory Visit
Admission: RE | Admit: 2023-03-07 | Discharge: 2023-03-07 | Disposition: A | Discharge status: Home / Self Care | Payer: Medicare HMO | Source: Ambulatory Visit | Attending: Surgery | Admitting: Surgery

## 2023-03-07 ENCOUNTER — Ambulatory Visit (INDEPENDENT_AMBULATORY_CARE_PROVIDER_SITE_OTHER): Payer: Self-pay | Admitting: Physician Assistant

## 2023-03-07 VITALS — BP 133/79 | HR 45 | Resp 20 | Wt 175.0 lb

## 2023-03-07 DIAGNOSIS — Z951 Presence of aortocoronary bypass graft: Secondary | ICD-10-CM

## 2023-03-07 DIAGNOSIS — J9811 Atelectasis: Secondary | ICD-10-CM | POA: Diagnosis not present

## 2023-03-07 DIAGNOSIS — M47814 Spondylosis without myelopathy or radiculopathy, thoracic region: Secondary | ICD-10-CM | POA: Diagnosis not present

## 2023-03-07 DIAGNOSIS — Z952 Presence of prosthetic heart valve: Secondary | ICD-10-CM

## 2023-03-07 DIAGNOSIS — J90 Pleural effusion, not elsewhere classified: Secondary | ICD-10-CM | POA: Diagnosis not present

## 2023-03-07 LAB — ECHOCARDIOGRAM COMPLETE
AV Mean grad: 9 mmHg
AV Peak grad: 17.6 mmHg
Ao pk vel: 2.1 m/s
Area-P 1/2: 5.34 cm2
S' Lateral: 3.5 cm

## 2023-03-07 MED ORDER — PERFLUTREN LIPID MICROSPHERE
1.0000 mL | INTRAVENOUS | Status: AC | PRN
  Administered 2023-03-07: 2 mL via INTRAVENOUS

## 2023-03-07 NOTE — Patient Instructions (Signed)
Endocarditis is a potentially serious infection of heart valves or inside lining of the heart.  It occurs more commonly in patients with diseased heart valves (such as patient's with aortic or mitral valve disease) and in patients who have undergone heart valve repair or replacement.  Certain surgical and dental procedures may put you at risk, such as dental cleaning, other dental procedures, or any surgery involving the respiratory, urinary, gastrointestinal tract, gallbladder or prostate gland.   To minimize your chances for develooping endocarditis, maintain good oral health and seek prompt medical attention for any infections involving the mouth, teeth, gums, skin or urinary tract.    Always notify your doctor or dentist about your underlying heart valve condition before having any invasive procedures. You will need to take antibiotics before certain procedures, including all routine dental cleanings or other dental procedures.  Your cardiologist or dentist should prescribe these antibiotics for you to be taken ahead of time.       Patient is counseled regarding the importance of long term risk factor modification as they pertain to the presence of ischemic heart disease including avoiding the use of all tobacco products, dietary modifications and medical therapy for diabetes, cholesterol and lipid management, and regular exercise.    You are encouraged to enroll and participate in the outpatient cardiac rehab program beginning as soon as practical.  Make every effort to maintain a "heart-healthy" lifestyle with regular physical exercise and adherence to a low-fat, low-carbohydrate diet.  Continue to seek regular follow-up appointments with your primary care physician and/or cardiologist.  You may return to driving an automobile as long as you are no longer requiring oral narcotic pain relievers during the daytime.  It would be wise to start driving only short distances during the daylight and  gradually increase from there as you feel comfortable.  You may continue to gradually increase your physical activity as tolerated.  Refrain from any heavy lifting or strenuous use of your arms and shoulders until at least 8 weeks from the time of your surgery, and avoid activities that cause increased pain in your chest on the side of your surgical incision.  Otherwise you may continue to increase activities without any particular limitations.  Increase the intensity and duration of physical activity gradually.

## 2023-03-07 NOTE — Progress Notes (Signed)
[address]       [place] [address]             [phone]    HPI:  Raymond Roberson is a 67 yo male with history of CVA, HTN, HLD,  Gout, GERD, Type 2 DM, Anxiety/Depression, Chronic Pain on Chronic Narcotics, Chewing Tobacco, and Hisotry of Alcohol abuse. Patient returns for routine postoperative follow-up having undergone CABG x 1 and an Aortic Valve Replacement.  The patient's early postoperative recovery while in the hospital progressed without complications.  Since hospital discharge the patient reports he is doing okay.  He continues to have discomfort across  his chest.  However he admits he does not follow sternal precautions.  He has been sleeping on his side and doing push ups since he left the hospital.  He is ambulating about a mile before he develops shortness of breath.  He states he is able to do everything he was prior to surgery, but his energy level has not fully recovered.  He has not been compliant with medications and overall isn't quite sure what he has been taking.   Current Outpatient Medications  Medication Sig Dispense Refill   ALPRAZolam (XANAX) 0.5 MG tablet Take 0.5 mg by mouth 2 (two) times daily.     aspirin 81 MG tablet Take 81 mg by mouth daily.     Aspirin-Acetaminophen (GOODYS BODY PAIN PO) Take 1 Package by mouth daily as needed (pain/out of aspirin).     atorvastatin (LIPITOR) 80 MG tablet Take 1 tablet (80 mg total) by mouth daily. 30 tablet 3   citalopram (CELEXA) 40 MG tablet Take 20 mg by mouth daily.     empagliflozin (JARDIANCE) 10 MG TABS tablet Take 1 tablet (10 mg total) by mouth daily. 30 tablet 3   esomeprazole (NEXIUM) 40 MG capsule Take 40 mg by mouth daily.     HYDROcodone-acetaminophen (NORCO/VICODIN) 5-325 MG tablet Take 1 tablet by mouth 4 (four) times daily.     hydrocortisone (ANUSOL-HC) 2.5 % rectal cream Place 1 application  rectally daily as needed for hemorrhoids or anal itching.     Multiple Vitamins-Minerals  (MULTIVITAMIN WITH MINERALS) tablet Take 1 tablet by mouth daily.     ondansetron (ZOFRAN) 8 MG tablet Take 8 mg by mouth once a week.     sildenafil (VIAGRA) 100 MG tablet Take 100 mg by mouth daily as needed for erectile dysfunction.     spironolactone (ALDACTONE) 25 MG tablet Take 1 tablet (25 mg total) by mouth daily. 30 tablet 3   No current facility-administered medications for this visit.    Physical Exam  BP 133/79   Pulse (!) 45   Resp 20   Wt 175 lb (79.4 kg)   SpO2 96%   BMI 23.73 kg/m   Gen: NAD Heart:RRR Lungs: CTA bilaterally Ext:  no edema present Incisions: well healed  Diagnostic Tests:  CXR: trace left pleural effusion/atelectasis.Marland Kitchen post surgical changes   A/P:  S/p CABG/AVR-overall doing well.. HR low today at 45 and he will not be started on a BB due to this Chronic Narcotic Use- no refills to be provided by this office as patient has a pain doctor DM- patient not taking Metformin, he also has not started Jardiance.  He was encouraged to start and see if he can tolerate this medication HTN- BP controlled today, patient is not sure if he is taking this or not Chest discomfort- patient has not been  compliant with sternal precautions, wires are intact.. he was instructed to start following sternal precautions until 4/23 and his pain should improve.   He may start cardiac rehab if interested  Patient has missed Cardiology follow up.  He was educated on importance of following up with his Cardiologist several times per year.  We will set him up an appointment for [place].  He was instructed to take all medications he is taking with him to that appointment.  He has had an Echocardiogram completed today.   RTC prn  Ellwood Handler, PA-C [facility] [phone]

## 2023-03-08 ENCOUNTER — Encounter: Payer: Self-pay | Admitting: Emergency Medicine

## 2023-03-08 ENCOUNTER — Telehealth: Payer: Self-pay | Admitting: Emergency Medicine

## 2023-03-08 NOTE — Telephone Encounter (Signed)
Called, no answer, no mailbox set up  [employer] with results

## 2023-03-14 DIAGNOSIS — Z6823 Body mass index (BMI) 23.0-23.9, adult: Secondary | ICD-10-CM | POA: Diagnosis not present

## 2023-03-14 DIAGNOSIS — L03111 Cellulitis of right axilla: Secondary | ICD-10-CM | POA: Diagnosis not present

## 2023-03-14 DIAGNOSIS — R030 Elevated blood-pressure reading, without diagnosis of hypertension: Secondary | ICD-10-CM | POA: Diagnosis not present

## 2023-04-10 ENCOUNTER — Ambulatory Visit: Payer: Medicare HMO | Attending: Internal Medicine | Admitting: Internal Medicine

## 2023-04-10 ENCOUNTER — Encounter: Payer: Self-pay | Admitting: Internal Medicine

## 2023-04-10 VITALS — BP 110/80 | HR 74 | Ht 72.0 in | Wt 184.8 lb

## 2023-04-10 DIAGNOSIS — I2510 Atherosclerotic heart disease of native coronary artery without angina pectoris: Secondary | ICD-10-CM

## 2023-04-10 DIAGNOSIS — I255 Ischemic cardiomyopathy: Secondary | ICD-10-CM | POA: Insufficient documentation

## 2023-04-10 DIAGNOSIS — Z951 Presence of aortocoronary bypass graft: Secondary | ICD-10-CM | POA: Diagnosis not present

## 2023-04-10 DIAGNOSIS — E7849 Other hyperlipidemia: Secondary | ICD-10-CM | POA: Diagnosis not present

## 2023-04-10 DIAGNOSIS — E785 Hyperlipidemia, unspecified: Secondary | ICD-10-CM | POA: Insufficient documentation

## 2023-04-10 MED ORDER — METOPROLOL TARTRATE 25 MG PO TABS: 12.5000 mg | ORAL_TABLET | Freq: Two times a day (BID) | ORAL | 1 refills | Status: DC

## 2023-04-10 MED ORDER — CLOPIDOGREL BISULFATE 75 MG PO TABS: 75.0000 mg | ORAL_TABLET | Freq: Every day | ORAL | 1 refills | Status: DC

## 2023-04-10 NOTE — Patient Instructions (Addendum)
Medication Instructions:  Your physician has recommended you make the following change in your medication:  Stop jardiance Start metoprolol tartrate 12.5 mg twice daily Start plavix 75 mg daily Continue other medications the same  Labwork: none  Testing/Procedures: Your physician has requested that you have an echocardiogram in 3 months. Echocardiography is a painless test that uses sound waves to create images of your heart. It provides your doctor with information about the size and shape of your heart and how well your heart's chambers and valves are working. This procedure takes approximately one hour. There are no restrictions for this procedure. Please do NOT wear cologne, perfume, aftershave, or lotions (deodorant is allowed). Please arrive 15 minutes prior to your appointment time.  Follow-Up: Your physician recommends that you schedule a follow-up appointment in: 6 months  Any Other Special Instructions Will Be Listed Below (If Applicable).  If you need a refill on your cardiac medications before your next appointment, please call your pharmacy.

## 2023-04-10 NOTE — Progress Notes (Addendum)
Cardiology Office Note  Date: 04/10/2023   ID: Raymond Roberson, DOB 06/24/1956, MRN [id number]  PCP:  Richardean Chimera, MD  Cardiologist:  None Electrophysiologist:  None   Reason for Office Visit: Follow-up of CAD and SAVR   History of Present Illness: Raymond Roberson is a 67 y.o. male known to have CAD manifested by NSTEMI in 12/2022 s/p 1v CABG (LIMA to LAD) with residual CTO RCA with collaterals from left with LVEF 40 to 45%, severe aortic valve stenosis from bicuspid aortic valve s/p SAVR (27 mm Edwards Inspiris Resilia pericardial valve), HTN, HLD is here for follow-up visit.  Patient was admitted to [facility] in 12/2022 with NSTEMI. LHC showed proximal LAD stenosis and CTO of RCA.  Echocardiogram showed LVEF 30 to 35%, severe aortic valve stenosis and mild AI with underlying bicuspid aortic valve.  He was managed for cardiogenic shock on inotropic support (milrinone), stabilized and eventually he left AMA.  Post CABG echocardiogram showed LVEF 40 to 45%.  He was seen by CT surgery in the outpatient clinic, beta-blocker was deferred due to HR 45 bpm.  He is here for follow-up visit.  He denies any chest pain ever, had DOE prior to 12/2022 admission, denies any DOE now but has left arm tingling for the last 1 to 2 months. Denies any leg swelling, dizziness, palpitations. He is currently on aspirin but not on Plavix.  He was also taking Jardiance which she did not tolerate, had side effects of memory changes, confusion etc. after stopping the medication, the symptoms resolved.  Past Medical History:  Diagnosis Date   Acute angina (HCC)    Bicuspid aortic valve    Chronic alcohol abuse    Classic migraine 09/02/2013   CVA (cerebral vascular accident) (HCC)    Diabetes mellitus without complication (HCC)    Dizziness and giddiness 09/02/2013   GERD (gastroesophageal reflux disease)    Gouty arthritis    Hypertension    Major depression    Other and unspecified  hyperlipidemia    Peptic ulcer disease    Sleep apnea     Past Surgical History:  Procedure Laterality Date   AORTIC VALVE REPLACEMENT N/A 01/23/2023   Procedure: AORTIC VALVE REPLACEMENT (AVR) USING 27 INSPIRIS VALVE;  Surgeon: Alleen Borne, MD;  Location: [facility] OR;  Service: Open Heart Surgery;  Laterality: N/A;   BIOPSY  07/21/2022   Procedure: BIOPSY;  Surgeon: Dolores Frame, MD;  Location: [facility] ENDO SUITE;  Service: Gastroenterology;;   CIRCUMCISION     COLONOSCOPY WITH PROPOFOL N/A 07/21/2022   Procedure: COLONOSCOPY WITH PROPOFOL;  Surgeon: Dolores Frame, MD;  Location: [facility] ENDO SUITE;  Service: Gastroenterology;  Laterality: N/A;  11:15 ASA 2   CORONARY ARTERY BYPASS GRAFT N/A 01/23/2023   Procedure: CORONARY ARTERY BYPASS GRAFTING (CABG) 1 TIMES  USING LEFT INTERNAL MAMMARY ARTERY,  RIGHT SAPHENOUS LEG VEIN HARVESTED ENDOSCOPICALLY;  Surgeon: Alleen Borne, MD;  Location: [facility] OR;  Service: Open Heart Surgery;  Laterality: N/A;   CYST REMOVAL TRUNK  11/24/2012   back   INGUINAL HERNIA REPAIR Bilateral    POLYPECTOMY  07/21/2022   Procedure: POLYPECTOMY;  Surgeon: Dolores Frame, MD;  Location: [facility] ENDO SUITE;  Service: Gastroenterology;;   RHINOPLASTY     RIGHT HEART CATH AND CORONARY ANGIOGRAPHY N/A 01/22/2023   Procedure: RIGHT HEART CATH AND CORONARY ANGIOGRAPHY;  Surgeon: Runell Gess, MD;  Location: [facility] INVASIVE CV LAB;  Service: Cardiovascular;  Laterality: N/A;   TEE WITHOUT CARDIOVERSION N/A 01/23/2023   Procedure: TRANSESOPHAGEAL ECHOCARDIOGRAM;  Surgeon: Alleen Borne, MD;  Location: [facility] OR;  Service: Open Heart Surgery;  Laterality: N/A;    Current Outpatient Medications  Medication Sig Dispense Refill   ALPRAZolam (XANAX) 0.5 MG tablet Take 0.5 mg by mouth 2 (two) times daily.     aspirin 81 MG tablet Take 81 mg by mouth daily.     Aspirin-Acetaminophen (GOODYS BODY PAIN PO) Take 1 Package by mouth daily as needed (pain/out of  aspirin).     atorvastatin (LIPITOR) 80 MG tablet Take 1 tablet (80 mg total) by mouth daily. 30 tablet 3   citalopram (CELEXA) 40 MG tablet Take 20 mg by mouth daily.     empagliflozin (JARDIANCE) 10 MG TABS tablet Take 1 tablet (10 mg total) by mouth daily. 30 tablet 3   esomeprazole (NEXIUM) 40 MG capsule Take 40 mg by mouth daily.     HYDROcodone-acetaminophen (NORCO/VICODIN) 5-325 MG tablet Take 1 tablet by mouth 4 (four) times daily.     hydrocortisone (ANUSOL-HC) 2.5 % rectal cream Place 1 application  rectally daily as needed for hemorrhoids or anal itching.     Multiple Vitamins-Minerals (MULTIVITAMIN WITH MINERALS) tablet Take 1 tablet by mouth daily.     ondansetron (ZOFRAN) 8 MG tablet Take 8 mg by mouth once a week.     sildenafil (VIAGRA) 100 MG tablet Take 100 mg by mouth daily as needed for erectile dysfunction.     spironolactone (ALDACTONE) 25 MG tablet Take 1 tablet (25 mg total) by mouth daily. 30 tablet 3   No current facility-administered medications for this visit.   Allergies:  Naproxen and Sulfa antibiotics   Social History: The patient  reports that he has never smoked. His smokeless tobacco use includes snuff. He reports that he does not currently use alcohol. He reports that he does not use drugs.   Family History: The patient's family history includes Allergies in his mother; Anxiety disorder in his mother; CAD in his father; Heart failure in his father.   ROS:  Please see the history of present illness. Otherwise, complete review of systems is positive for none.  All other systems are reviewed and negative.   Physical Exam: VS:  There were no vitals taken for this visit., BMI There is no height or weight on file to calculate BMI.  Wt Readings from Last 3 Encounters:  03/07/23 175 lb (79.4 kg)  01/27/23 171 lb 4.8 oz (77.7 kg)  07/19/22 220 lb (99.8 kg)    General: Patient appears comfortable at rest. HEENT: Conjunctiva and lids normal, oropharynx clear  with moist mucosa. Neck: Supple, no elevated JVP or carotid bruits, no thyromegaly. Lungs: Clear to auscultation, nonlabored breathing at rest. Cardiac: Regular rate and rhythm, no S3 or significant systolic murmur, no pericardial rub. Abdomen: Soft, nontender, no hepatomegaly, bowel sounds present, no guarding or rebound. Extremities: No pitting edema, distal pulses 2+. Skin: Warm and dry. Musculoskeletal: No kyphosis. Neuropsychiatric: Alert and oriented x3, affect grossly appropriate.  ECG: NSR  Recent Labwork: 01/19/2023: ALT 20; AST 34; B Natriuretic Peptide 897.6; TSH 1.447 01/27/2023: BUN 13; Creatinine, Ser 0.86; Hemoglobin 10.1; Magnesium 2.4; Platelets 294; Potassium 4.1; Sodium 135  No results found for: "CHOL", "TRIG", "HDL", "CHOLHDL", "VLDL", "LDLCALC", "LDLDIRECT"  Other Studies Reviewed Today: Echocardiogram in 02/2023 LVEF 40 to 45% with RWMA G1 DD RV systolic function is normal Trivial MR Bioprosthetic aortic valve functioning normal  Assessment  and Plan: Patient is a 67 year old M known to have CAD manifested by NSTEMI in 12/2022 s/p 1v CABG (LIMA to LAD) with residual CTO RCA with collaterals from left with LVEF 40 to 45%, severe aortic valve stenosis from bicuspid aortic valve s/p SAVR (27 mm Edwards Inspiris Resilia pericardial valve), HTN, HLD is here for follow-up visit.  # CAD manifested by NSTEMI in 12/2022 s/p 1v CABG (LIMA to LAD) with residual CTO RCA with collaterals from left with LVEF 40 to 45%, currently angina free -Continue aspirin 81 mg once daily and start Plavix 75 mg once daily, DAPT for total duration of 1 year. -Continue atorvastatin 80 mg nightly -Start metoprolol tartrate 12.5 mg twice daily -Continue losartan 25 mg once daily -Jardiance discontinued as patient was having symptoms of confusion, memory changes after starting the medication. Symptoms resolved after discontinuing the medication. -Not on nitrates due to sildenafil use -ER  precautions for chest pain  # HLD: Continue atorvastatin 80 mg nightly.  LDL less than <70 in 08/2022. Goal LDL< 70. # s/p SAVR (27 mm Edwards Inspiris Resilia pericardial valve)  on 12/2022: Continue aspirin 81 mg once daily, 10 tablets twice per year and antibiotics prior to any dental procedures. # Ischemic cardiomyopathy, LVEF 40 to 45%, compensated: No need of Lasix, start metoprolol tartrate 12.5 mg twice daily, losartan 25 mg once daily and spironolactone 25 mg once daily.  Repeat echocardiogram in 3 months.  I have spent a total of 30 minutes with patient reviewing chart, EKGs, labs and examining patient as well as establishing an assessment and plan that was discussed with the patient.  > 50% of time was spent in direct patient care.      Medication Adjustments/Labs and Tests Ordered: Current medicines are reviewed at length with the patient today.  Concerns regarding medicines are outlined above.   Tests Ordered: Orders Placed This Encounter  Procedures   ECHOCARDIOGRAM COMPLETE      Medication Changes: Meds ordered this encounter  Medications   metoprolol tartrate (LOPRESSOR) 25 MG tablet    Sig: Take 0.5 tablets (12.5 mg total) by mouth 2 (two) times daily.    Dispense:  90 tablet    Refill:  1    04/10/2023 NEW   clopidogrel (PLAVIX) 75 MG tablet    Sig: Take 1 tablet (75 mg total) by mouth daily.    Dispense:  90 tablet    Refill:  1    04/10/2023 NEW      Disposition:  Follow up  6 months  Signed Raymond Leyland Verne Spurr, MD, 04/10/2023 9:02 AM    [facility] [address] [place], [place], [place] [address]

## 2023-04-24 DIAGNOSIS — K2100 Gastro-esophageal reflux disease with esophagitis, without bleeding: Secondary | ICD-10-CM | POA: Diagnosis not present

## 2023-04-24 DIAGNOSIS — E7849 Other hyperlipidemia: Secondary | ICD-10-CM | POA: Diagnosis not present

## 2023-04-24 DIAGNOSIS — R5383 Other fatigue: Secondary | ICD-10-CM | POA: Diagnosis not present

## 2023-04-24 DIAGNOSIS — E1165 Type 2 diabetes mellitus with hyperglycemia: Secondary | ICD-10-CM | POA: Diagnosis not present

## 2023-04-26 DIAGNOSIS — E7849 Other hyperlipidemia: Secondary | ICD-10-CM | POA: Diagnosis not present

## 2023-04-26 DIAGNOSIS — I10 Essential (primary) hypertension: Secondary | ICD-10-CM | POA: Diagnosis not present

## 2023-04-26 DIAGNOSIS — L219 Seborrheic dermatitis, unspecified: Secondary | ICD-10-CM | POA: Diagnosis not present

## 2023-04-26 DIAGNOSIS — Z0001 Encounter for general adult medical examination with abnormal findings: Secondary | ICD-10-CM | POA: Diagnosis not present

## 2023-04-26 DIAGNOSIS — Z79891 Long term (current) use of opiate analgesic: Secondary | ICD-10-CM | POA: Diagnosis not present

## 2023-04-26 DIAGNOSIS — N486 Induration penis plastica: Secondary | ICD-10-CM | POA: Diagnosis not present

## 2023-04-26 DIAGNOSIS — K760 Fatty (change of) liver, not elsewhere classified: Secondary | ICD-10-CM | POA: Diagnosis not present

## 2023-04-26 DIAGNOSIS — F411 Generalized anxiety disorder: Secondary | ICD-10-CM | POA: Diagnosis not present

## 2023-04-26 DIAGNOSIS — F331 Major depressive disorder, recurrent, moderate: Secondary | ICD-10-CM | POA: Diagnosis not present

## 2023-04-27 ENCOUNTER — Other Ambulatory Visit: Payer: Self-pay | Admitting: Physician Assistant

## 2023-05-10 DIAGNOSIS — R000 Tachycardia, unspecified: Secondary | ICD-10-CM | POA: Diagnosis not present

## 2023-05-10 DIAGNOSIS — R42 Dizziness and giddiness: Secondary | ICD-10-CM | POA: Diagnosis not present

## 2023-05-10 DIAGNOSIS — R079 Chest pain, unspecified: Secondary | ICD-10-CM | POA: Diagnosis not present

## 2023-05-30 ENCOUNTER — Other Ambulatory Visit: Payer: Self-pay | Admitting: Physician Assistant

## 2023-05-31 ENCOUNTER — Other Ambulatory Visit: Payer: Self-pay | Admitting: Internal Medicine

## 2023-05-31 ENCOUNTER — Telehealth: Payer: Self-pay | Admitting: Internal Medicine

## 2023-05-31 MED ORDER — ATORVASTATIN CALCIUM 80 MG PO TABS: 80.0000 mg | ORAL_TABLET | Freq: Every day | ORAL | 6 refills | Status: DC

## 2023-05-31 NOTE — Telephone Encounter (Signed)
sent 

## 2023-05-31 NOTE — Telephone Encounter (Signed)
*  STAT* If patient is at the pharmacy, call can be transferred to refill team.   1. Which medications need to be refilled? (please list name of each medication and dose if known)   spironolactone (ALDACTONE) 25 MG tablet  atorvastatin (LIPITOR) 80 MG tablet   2. Which pharmacy/location (including street and city if local pharmacy) is medication to be sent to?  [facility] - [place], [place] - [address]   3. Do they need a 30 day or 90 day supply?   90 day  Patient stated he is completely out of this medication.

## 2023-06-19 ENCOUNTER — Telehealth: Payer: Self-pay | Admitting: Internal Medicine

## 2023-06-19 NOTE — Telephone Encounter (Signed)
Spoke with patient - states that his BP is 125/92 &  HR is 146.  He does stated that he feels like he could be dehydrated as he has been drinking beer off / on last few days.  Denies any c/o SOB, chest pain or dizziness.  Does not feel like his heart is racing, but states that he does notice some palpitations first thing in the mornings when he turns over in the bed.  Does have Lopressor 12.5mg  twice a day on his meds list, but states that he stopped taking for a while and has just been back on it for 1-2 weeks, took last dose at noon today.  Asked patient to sit quietly x 10 minutes for a recheck of BP & HR and I would call him back to get those readings.    Call returned to patient - BP 120/83 & HR 147.    Discussed with Sharlene Dory, NP - she suggest increasing his Lopressor to 25mg  twice a day & can f/u with her in the next 2 weeks.  Appointment scheduled.  Also suggested that he continue to monitor his BP & HR at home with proper technique (reviewed with patient) - keep log & let us know if HR's not getting better.  Informed patient to go to ED for evaluation if symptoms worsen or he feels like he may pass out.  Patient verbalized understanding.

## 2023-06-19 NOTE — Telephone Encounter (Signed)
STAT if HR is under 50 or over 120 (normal HR is 60-100 beats per minute)  What is your heart rate? 146  Do you have a log of your heart rate readings (document readings)?   Do you have any other symptoms? BP 92/125

## 2023-06-22 DIAGNOSIS — C44319 Basal cell carcinoma of skin of other parts of face: Secondary | ICD-10-CM | POA: Diagnosis not present

## 2023-07-02 ENCOUNTER — Emergency Department (HOSPITAL_COMMUNITY): Payer: Medicare HMO

## 2023-07-02 ENCOUNTER — Encounter (HOSPITAL_COMMUNITY): Payer: Self-pay | Admitting: Emergency Medicine

## 2023-07-02 ENCOUNTER — Other Ambulatory Visit: Payer: Self-pay

## 2023-07-02 ENCOUNTER — Inpatient Hospital Stay (HOSPITAL_COMMUNITY): Payer: Medicare HMO

## 2023-07-02 ENCOUNTER — Inpatient Hospital Stay (HOSPITAL_COMMUNITY)
Admission: EM | Admit: 2023-07-02 | Discharge: 2023-07-15 | DRG: 896 | Disposition: A | Discharge status: Home / Self Care | Payer: Medicare HMO | Attending: Internal Medicine | Admitting: Internal Medicine

## 2023-07-02 DIAGNOSIS — J153 Pneumonia due to streptococcus, group B: Secondary | ICD-10-CM | POA: Diagnosis not present

## 2023-07-02 DIAGNOSIS — I252 Old myocardial infarction: Secondary | ICD-10-CM

## 2023-07-02 DIAGNOSIS — E1142 Type 2 diabetes mellitus with diabetic polyneuropathy: Secondary | ICD-10-CM | POA: Diagnosis present

## 2023-07-02 DIAGNOSIS — J9601 Acute respiratory failure with hypoxia: Secondary | ICD-10-CM | POA: Diagnosis not present

## 2023-07-02 DIAGNOSIS — I110 Hypertensive heart disease with heart failure: Secondary | ICD-10-CM | POA: Diagnosis present

## 2023-07-02 DIAGNOSIS — I5043 Acute on chronic combined systolic (congestive) and diastolic (congestive) heart failure: Secondary | ICD-10-CM | POA: Diagnosis not present

## 2023-07-02 DIAGNOSIS — G43109 Migraine with aura, not intractable, without status migrainosus: Secondary | ICD-10-CM | POA: Diagnosis present

## 2023-07-02 DIAGNOSIS — I483 Typical atrial flutter: Secondary | ICD-10-CM | POA: Diagnosis present

## 2023-07-02 DIAGNOSIS — M549 Dorsalgia, unspecified: Secondary | ICD-10-CM | POA: Diagnosis present

## 2023-07-02 DIAGNOSIS — J9690 Respiratory failure, unspecified, unspecified whether with hypoxia or hypercapnia: Secondary | ICD-10-CM | POA: Diagnosis not present

## 2023-07-02 DIAGNOSIS — G4730 Sleep apnea, unspecified: Secondary | ICD-10-CM | POA: Diagnosis present

## 2023-07-02 DIAGNOSIS — Z8711 Personal history of peptic ulcer disease: Secondary | ICD-10-CM

## 2023-07-02 DIAGNOSIS — I350 Nonrheumatic aortic (valve) stenosis: Secondary | ICD-10-CM | POA: Diagnosis not present

## 2023-07-02 DIAGNOSIS — I255 Ischemic cardiomyopathy: Secondary | ICD-10-CM | POA: Diagnosis not present

## 2023-07-02 DIAGNOSIS — G9340 Encephalopathy, unspecified: Secondary | ICD-10-CM

## 2023-07-02 DIAGNOSIS — I10 Essential (primary) hypertension: Secondary | ICD-10-CM | POA: Diagnosis not present

## 2023-07-02 DIAGNOSIS — M19011 Primary osteoarthritis, right shoulder: Secondary | ICD-10-CM | POA: Diagnosis present

## 2023-07-02 DIAGNOSIS — Z882 Allergy status to sulfonamides status: Secondary | ICD-10-CM

## 2023-07-02 DIAGNOSIS — K219 Gastro-esophageal reflux disease without esophagitis: Secondary | ICD-10-CM | POA: Diagnosis present

## 2023-07-02 DIAGNOSIS — M25511 Pain in right shoulder: Secondary | ICD-10-CM | POA: Diagnosis not present

## 2023-07-02 DIAGNOSIS — M109 Gout, unspecified: Secondary | ICD-10-CM | POA: Diagnosis present

## 2023-07-02 DIAGNOSIS — I25119 Atherosclerotic heart disease of native coronary artery with unspecified angina pectoris: Secondary | ICD-10-CM

## 2023-07-02 DIAGNOSIS — F10930 Alcohol use, unspecified with withdrawal, uncomplicated: Secondary | ICD-10-CM | POA: Diagnosis not present

## 2023-07-02 DIAGNOSIS — I2582 Chronic total occlusion of coronary artery: Secondary | ICD-10-CM | POA: Diagnosis present

## 2023-07-02 DIAGNOSIS — I517 Cardiomegaly: Secondary | ICD-10-CM | POA: Diagnosis not present

## 2023-07-02 DIAGNOSIS — F17290 Nicotine dependence, other tobacco product, uncomplicated: Secondary | ICD-10-CM | POA: Diagnosis present

## 2023-07-02 DIAGNOSIS — I1A0 Resistant hypertension: Secondary | ICD-10-CM | POA: Diagnosis present

## 2023-07-02 DIAGNOSIS — F10239 Alcohol dependence with withdrawal, unspecified: Secondary | ICD-10-CM | POA: Diagnosis not present

## 2023-07-02 DIAGNOSIS — Z79891 Long term (current) use of opiate analgesic: Secondary | ICD-10-CM

## 2023-07-02 DIAGNOSIS — K9429 Other complications of gastrostomy: Secondary | ICD-10-CM | POA: Diagnosis not present

## 2023-07-02 DIAGNOSIS — Z953 Presence of xenogenic heart valve: Secondary | ICD-10-CM

## 2023-07-02 DIAGNOSIS — A419 Sepsis, unspecified organism: Secondary | ICD-10-CM | POA: Diagnosis not present

## 2023-07-02 DIAGNOSIS — Z8249 Family history of ischemic heart disease and other diseases of the circulatory system: Secondary | ICD-10-CM

## 2023-07-02 DIAGNOSIS — I2510 Atherosclerotic heart disease of native coronary artery without angina pectoris: Secondary | ICD-10-CM | POA: Diagnosis present

## 2023-07-02 DIAGNOSIS — R918 Other nonspecific abnormal finding of lung field: Secondary | ICD-10-CM | POA: Diagnosis not present

## 2023-07-02 DIAGNOSIS — F10931 Alcohol use, unspecified with withdrawal delirium: Secondary | ICD-10-CM | POA: Diagnosis present

## 2023-07-02 DIAGNOSIS — F10939 Alcohol use, unspecified with withdrawal, unspecified: Secondary | ICD-10-CM | POA: Diagnosis not present

## 2023-07-02 DIAGNOSIS — F10231 Alcohol dependence with withdrawal delirium: Secondary | ICD-10-CM | POA: Diagnosis not present

## 2023-07-02 DIAGNOSIS — Z951 Presence of aortocoronary bypass graft: Secondary | ICD-10-CM | POA: Diagnosis not present

## 2023-07-02 DIAGNOSIS — I5022 Chronic systolic (congestive) heart failure: Secondary | ICD-10-CM

## 2023-07-02 DIAGNOSIS — M545 Low back pain, unspecified: Secondary | ICD-10-CM

## 2023-07-02 DIAGNOSIS — I4892 Unspecified atrial flutter: Secondary | ICD-10-CM | POA: Diagnosis not present

## 2023-07-02 DIAGNOSIS — E873 Alkalosis: Secondary | ICD-10-CM | POA: Diagnosis present

## 2023-07-02 DIAGNOSIS — Z8673 Personal history of transient ischemic attack (TIA), and cerebral infarction without residual deficits: Secondary | ICD-10-CM

## 2023-07-02 DIAGNOSIS — Z886 Allergy status to analgesic agent status: Secondary | ICD-10-CM

## 2023-07-02 DIAGNOSIS — Z952 Presence of prosthetic heart valve: Secondary | ICD-10-CM | POA: Diagnosis not present

## 2023-07-02 DIAGNOSIS — R000 Tachycardia, unspecified: Secondary | ICD-10-CM | POA: Diagnosis present

## 2023-07-02 DIAGNOSIS — D509 Iron deficiency anemia, unspecified: Secondary | ICD-10-CM | POA: Diagnosis present

## 2023-07-02 DIAGNOSIS — F329 Major depressive disorder, single episode, unspecified: Secondary | ICD-10-CM | POA: Diagnosis present

## 2023-07-02 DIAGNOSIS — Z7982 Long term (current) use of aspirin: Secondary | ICD-10-CM

## 2023-07-02 DIAGNOSIS — G8929 Other chronic pain: Secondary | ICD-10-CM | POA: Diagnosis not present

## 2023-07-02 DIAGNOSIS — J9602 Acute respiratory failure with hypercapnia: Secondary | ICD-10-CM | POA: Diagnosis not present

## 2023-07-02 DIAGNOSIS — R6521 Severe sepsis with septic shock: Secondary | ICD-10-CM | POA: Diagnosis not present

## 2023-07-02 DIAGNOSIS — Z7902 Long term (current) use of antithrombotics/antiplatelets: Secondary | ICD-10-CM

## 2023-07-02 DIAGNOSIS — Z4682 Encounter for fitting and adjustment of non-vascular catheter: Secondary | ICD-10-CM | POA: Diagnosis not present

## 2023-07-02 DIAGNOSIS — G928 Other toxic encephalopathy: Secondary | ICD-10-CM | POA: Diagnosis not present

## 2023-07-02 DIAGNOSIS — Z79899 Other long term (current) drug therapy: Secondary | ICD-10-CM | POA: Diagnosis not present

## 2023-07-02 DIAGNOSIS — Z781 Physical restraint status: Secondary | ICD-10-CM

## 2023-07-02 DIAGNOSIS — F1010 Alcohol abuse, uncomplicated: Secondary | ICD-10-CM | POA: Diagnosis not present

## 2023-07-02 DIAGNOSIS — I4891 Unspecified atrial fibrillation: Secondary | ICD-10-CM | POA: Diagnosis present

## 2023-07-02 DIAGNOSIS — R197 Diarrhea, unspecified: Secondary | ICD-10-CM | POA: Diagnosis not present

## 2023-07-02 DIAGNOSIS — E782 Mixed hyperlipidemia: Secondary | ICD-10-CM | POA: Diagnosis present

## 2023-07-02 DIAGNOSIS — Z818 Family history of other mental and behavioral disorders: Secondary | ICD-10-CM | POA: Diagnosis not present

## 2023-07-02 DIAGNOSIS — I5020 Unspecified systolic (congestive) heart failure: Secondary | ICD-10-CM | POA: Diagnosis not present

## 2023-07-02 DIAGNOSIS — R1310 Dysphagia, unspecified: Secondary | ICD-10-CM | POA: Diagnosis not present

## 2023-07-02 DIAGNOSIS — T510X1A Toxic effect of ethanol, accidental (unintentional), initial encounter: Secondary | ICD-10-CM | POA: Diagnosis present

## 2023-07-02 DIAGNOSIS — E876 Hypokalemia: Secondary | ICD-10-CM | POA: Diagnosis present

## 2023-07-02 HISTORY — DX: Nonrheumatic aortic (valve) stenosis: I35.0

## 2023-07-02 HISTORY — DX: Atherosclerotic heart disease of native coronary artery without angina pectoris: I25.10

## 2023-07-02 HISTORY — DX: Mixed hyperlipidemia: E78.2

## 2023-07-02 LAB — URINALYSIS, ROUTINE W REFLEX MICROSCOPIC
Bilirubin Urine: NEGATIVE
Glucose, UA: NEGATIVE mg/dL
Hgb urine dipstick: NEGATIVE
Ketones, ur: NEGATIVE mg/dL
Leukocytes,Ua: NEGATIVE
Nitrite: NEGATIVE
Protein, ur: NEGATIVE mg/dL
Specific Gravity, Urine: 1.003 — ABNORMAL LOW (ref 1.005–1.030)
pH: 6 (ref 5.0–8.0)

## 2023-07-02 LAB — CBC WITH DIFFERENTIAL/PLATELET
Abs Immature Granulocytes: 0.03 10*3/uL (ref 0.00–0.07)
Basophils Absolute: 0 10*3/uL (ref 0.0–0.1)
Basophils Relative: 0 %
Eosinophils Absolute: 0.1 10*3/uL (ref 0.0–0.5)
Eosinophils Relative: 1 %
HCT: 40.7 % (ref 39.0–52.0)
Hemoglobin: 11.7 g/dL — ABNORMAL LOW (ref 13.0–17.0)
Immature Granulocytes: 0 %
Lymphocytes Relative: 9 %
Lymphs Abs: 0.7 10*3/uL (ref 0.7–4.0)
MCH: 21.7 pg — ABNORMAL LOW (ref 26.0–34.0)
MCHC: 28.7 g/dL — ABNORMAL LOW (ref 30.0–36.0)
MCV: 75.5 fL — ABNORMAL LOW (ref 80.0–100.0)
Monocytes Absolute: 0.4 10*3/uL (ref 0.1–1.0)
Monocytes Relative: 5 %
Neutro Abs: 6.9 10*3/uL (ref 1.7–7.7)
Neutrophils Relative %: 85 %
Platelets: 299 10*3/uL (ref 150–400)
RBC: 5.39 MIL/uL (ref 4.22–5.81)
RDW: 16.3 % — ABNORMAL HIGH (ref 11.5–15.5)
WBC: 8.1 10*3/uL (ref 4.0–10.5)
nRBC: 0 % (ref 0.0–0.2)

## 2023-07-02 LAB — COMPREHENSIVE METABOLIC PANEL
ALT: 30 U/L (ref 0–44)
AST: 28 U/L (ref 15–41)
Albumin: 4 g/dL (ref 3.5–5.0)
Alkaline Phosphatase: 62 U/L (ref 38–126)
Anion gap: 9 (ref 5–15)
BUN: 13 mg/dL (ref 8–23)
CO2: 26 mmol/L (ref 22–32)
Calcium: 9.3 mg/dL (ref 8.9–10.3)
Chloride: 99 mmol/L (ref 98–111)
Creatinine, Ser: 0.84 mg/dL (ref 0.61–1.24)
GFR, Estimated: >60 mL/min (ref >60)
Glucose, Bld: 107 mg/dL — ABNORMAL HIGH (ref 70–99)
Potassium: 3.8 mmol/L (ref 3.5–5.1)
Sodium: 134 mmol/L — ABNORMAL LOW (ref 135–145)
Total Bilirubin: 1.3 mg/dL — ABNORMAL HIGH (ref 0.3–1.2)
Total Protein: 8.3 g/dL — ABNORMAL HIGH (ref 6.5–8.1)

## 2023-07-02 LAB — LIPASE, BLOOD: Lipase: 47 U/L (ref 11–51)

## 2023-07-02 LAB — MAGNESIUM: Magnesium: 2.1 mg/dL (ref 1.7–2.4)

## 2023-07-02 LAB — ETHANOL: Alcohol, Ethyl (B): <10 mg/dL (ref <10)

## 2023-07-02 LAB — TROPONIN I (HIGH SENSITIVITY)
Troponin I (High Sensitivity): 25 ng/L — ABNORMAL HIGH (ref <18)
Troponin I (High Sensitivity): 25 ng/L — ABNORMAL HIGH (ref <18)

## 2023-07-02 MED ORDER — HALOPERIDOL LACTATE 5 MG/ML IJ SOLN
5.0000 mg | Freq: Once | INTRAMUSCULAR | Status: AC
  Administered 2023-07-02: 5 mg via INTRAMUSCULAR

## 2023-07-02 MED ORDER — ACETAMINOPHEN 650 MG RE SUPP: 650.0000 mg | Freq: Four times a day (QID) | RECTAL | Status: DC | PRN

## 2023-07-02 MED ORDER — ACETAMINOPHEN 325 MG PO TABS: 650.0000 mg | ORAL_TABLET | Freq: Four times a day (QID) | ORAL | Status: DC | PRN

## 2023-07-02 MED ORDER — PANTOPRAZOLE SODIUM 40 MG PO TBEC: 40.0000 mg | DELAYED_RELEASE_TABLET | Freq: Every day | ORAL | Status: DC

## 2023-07-02 MED ORDER — HALOPERIDOL LACTATE 5 MG/ML IJ SOLN
INTRAMUSCULAR | Status: AC
  Filled 2023-07-02: qty 1

## 2023-07-02 MED ORDER — AMIODARONE HCL IN DEXTROSE 360-4.14 MG/200ML-% IV SOLN
60.0000 mg/h | INTRAVENOUS | Status: AC
  Administered 2023-07-02 (×2): 60 mg/h via INTRAVENOUS
  Filled 2023-07-02: qty 200

## 2023-07-02 MED ORDER — ROCURONIUM BROMIDE 10 MG/ML (PF) SYRINGE
PREFILLED_SYRINGE | INTRAVENOUS | Status: AC
  Administered 2023-07-02: 100 mg
  Filled 2023-07-02: qty 10

## 2023-07-02 MED ORDER — LORAZEPAM 1 MG PO TABS: 1.0000 mg | ORAL_TABLET | ORAL | Status: DC | PRN

## 2023-07-02 MED ORDER — APIXABAN 5 MG PO TABS
5.0000 mg | ORAL_TABLET | Freq: Two times a day (BID) | ORAL | Status: DC
  Administered 2023-07-02: 5 mg via ORAL
  Filled 2023-07-02: qty 1

## 2023-07-02 MED ORDER — PROPOFOL 1000 MG/100ML IV EMUL
5.0000 ug/kg/min | INTRAVENOUS | Status: DC
  Administered 2023-07-03 (×4): 60 ug/kg/min via INTRAVENOUS
  Administered 2023-07-04 (×3): 50 ug/kg/min via INTRAVENOUS
  Administered 2023-07-04 (×3): 60 ug/kg/min via INTRAVENOUS
  Administered 2023-07-04: 50 ug/kg/min via INTRAVENOUS
  Administered 2023-07-04 – 2023-07-05 (×2): 60 ug/kg/min via INTRAVENOUS
  Administered 2023-07-05 (×2): 50 ug/kg/min via INTRAVENOUS
  Filled 2023-07-02 (×18): qty 100

## 2023-07-02 MED ORDER — METOPROLOL TARTRATE 25 MG PO TABS
12.5000 mg | ORAL_TABLET | Freq: Two times a day (BID) | ORAL | Status: DC
  Filled 2023-07-02: qty 1

## 2023-07-02 MED ORDER — THIAMINE HCL 100 MG/ML IJ SOLN
100.0000 mg | Freq: Every day | INTRAMUSCULAR | Status: DC
  Administered 2023-07-02: 100 mg via INTRAVENOUS
  Filled 2023-07-02: qty 2

## 2023-07-02 MED ORDER — ASPIRIN 81 MG PO CHEW: 81.0000 mg | CHEWABLE_TABLET | Freq: Every day | ORAL | Status: DC

## 2023-07-02 MED ORDER — PROPOFOL 1000 MG/100ML IV EMUL
INTRAVENOUS | Status: AC
  Administered 2023-07-03: 5 ug/kg/min via INTRAVENOUS
  Filled 2023-07-02: qty 100

## 2023-07-02 MED ORDER — LORAZEPAM 1 MG PO TABS
1.0000 mg | ORAL_TABLET | ORAL | Status: DC | PRN
  Administered 2023-07-02: 1 mg via ORAL
  Filled 2023-07-02: qty 1

## 2023-07-02 MED ORDER — ETOMIDATE 2 MG/ML IV SOLN
INTRAVENOUS | Status: AC
  Administered 2023-07-02: 20 mg
  Filled 2023-07-02: qty 10

## 2023-07-02 MED ORDER — FENTANYL CITRATE PF 50 MCG/ML IJ SOSY
12.5000 ug | PREFILLED_SYRINGE | INTRAMUSCULAR | Status: DC | PRN
  Administered 2023-07-03 (×2): 12.5 ug via INTRAVENOUS
  Filled 2023-07-02 (×2): qty 1

## 2023-07-02 MED ORDER — POTASSIUM CHLORIDE CRYS ER 20 MEQ PO TBCR
20.0000 meq | EXTENDED_RELEASE_TABLET | Freq: Once | ORAL | Status: AC
  Administered 2023-07-02: 20 meq via ORAL
  Filled 2023-07-02: qty 1

## 2023-07-02 MED ORDER — METOPROLOL TARTRATE 5 MG/5ML IV SOLN
5.0000 mg | Freq: Once | INTRAVENOUS | Status: AC
  Administered 2023-07-02: 5 mg via INTRAVENOUS
  Filled 2023-07-02: qty 5

## 2023-07-02 MED ORDER — ATORVASTATIN CALCIUM 40 MG PO TABS: 80.0000 mg | ORAL_TABLET | Freq: Every day | ORAL | Status: DC

## 2023-07-02 MED ORDER — POLYETHYLENE GLYCOL 3350 17 G PO PACK: 17.0000 g | PACK | Freq: Every day | ORAL | Status: DC | PRN

## 2023-07-02 MED ORDER — LORAZEPAM 2 MG/ML IJ SOLN
1.0000 mg | INTRAMUSCULAR | Status: DC | PRN
  Administered 2023-07-02 (×2): 4 mg via INTRAVENOUS
  Administered 2023-07-02: 2 mg via INTRAVENOUS
  Filled 2023-07-02: qty 1
  Filled 2023-07-02 (×2): qty 2

## 2023-07-02 MED ORDER — DEXMEDETOMIDINE HCL IN NACL 400 MCG/100ML IV SOLN
0.0000 ug/kg/h | INTRAVENOUS | Status: DC
  Administered 2023-07-02: 0.4 ug/kg/h via INTRAVENOUS

## 2023-07-02 MED ORDER — FOLIC ACID 1 MG PO TABS
1.0000 mg | ORAL_TABLET | Freq: Every day | ORAL | Status: DC
  Administered 2023-07-02: 1 mg via ORAL
  Filled 2023-07-02: qty 1

## 2023-07-02 MED ORDER — SODIUM CHLORIDE 0.9 % IV BOLUS
1000.0000 mL | Freq: Once | INTRAVENOUS | Status: AC
  Administered 2023-07-02: 1000 mL via INTRAVENOUS

## 2023-07-02 MED ORDER — SODIUM CHLORIDE 0.9 % IV SOLN: INTRAVENOUS | Status: DC

## 2023-07-02 MED ORDER — ADULT MULTIVITAMIN W/MINERALS CH
1.0000 | ORAL_TABLET | Freq: Every day | ORAL | Status: DC
  Administered 2023-07-02: 1 via ORAL
  Filled 2023-07-02: qty 1

## 2023-07-02 MED ORDER — LORAZEPAM 2 MG/ML IJ SOLN: 4.0000 mg | Freq: Once | INTRAMUSCULAR | Status: DC

## 2023-07-02 MED ORDER — ONDANSETRON HCL 4 MG/2ML IJ SOLN
4.0000 mg | Freq: Once | INTRAMUSCULAR | Status: AC
  Administered 2023-07-02: 4 mg via INTRAVENOUS
  Filled 2023-07-02: qty 2

## 2023-07-02 MED ORDER — DEXMEDETOMIDINE HCL IN NACL 400 MCG/100ML IV SOLN
INTRAVENOUS | Status: AC
  Filled 2023-07-02: qty 100

## 2023-07-02 MED ORDER — AMIODARONE HCL IN DEXTROSE 360-4.14 MG/200ML-% IV SOLN
30.0000 mg/h | INTRAVENOUS | Status: DC
  Administered 2023-07-03 – 2023-07-09 (×14): 30 mg/h via INTRAVENOUS
  Filled 2023-07-02 (×15): qty 200

## 2023-07-02 MED ORDER — LORAZEPAM 2 MG/ML IJ SOLN
1.0000 mg | INTRAMUSCULAR | Status: DC | PRN
  Administered 2023-07-02: 4 mg via INTRAVENOUS
  Filled 2023-07-02: qty 2

## 2023-07-02 MED ORDER — AMIODARONE LOAD VIA INFUSION
150.0000 mg | Freq: Once | INTRAVENOUS | Status: AC
  Administered 2023-07-02: 150 mg via INTRAVENOUS
  Filled 2023-07-02: qty 83.34

## 2023-07-02 MED ORDER — HYDROCODONE-ACETAMINOPHEN 5-325 MG PO TABS
1.0000 | ORAL_TABLET | Freq: Four times a day (QID) | ORAL | Status: DC
  Administered 2023-07-02 (×2): 1 via ORAL
  Filled 2023-07-02 (×2): qty 1

## 2023-07-02 MED ORDER — THIAMINE MONONITRATE 100 MG PO TABS: 100.0000 mg | ORAL_TABLET | Freq: Every day | ORAL | Status: DC

## 2023-07-02 MED ORDER — LORAZEPAM 2 MG/ML IJ SOLN: 0.0000 mg | Freq: Four times a day (QID) | INTRAMUSCULAR | Status: DC

## 2023-07-02 MED ORDER — LORAZEPAM 2 MG/ML IJ SOLN
2.0000 mg | Freq: Once | INTRAMUSCULAR | Status: AC
  Administered 2023-07-02: 2 mg via INTRAVENOUS
  Filled 2023-07-02: qty 1

## 2023-07-02 MED ORDER — LORAZEPAM 2 MG/ML IJ SOLN: 0.0000 mg | Freq: Two times a day (BID) | INTRAMUSCULAR | Status: DC

## 2023-07-02 NOTE — Progress Notes (Signed)
Patient continued to be increasingly agitated and confused, threatening staff and family at bedside.  Unable to reorient or redirect.  Haldol and ativan ineffective with precedex at 1.74mcg/kg dosing.   MD came to bedside to evaluate and intubation ordered.

## 2023-07-02 NOTE — ED Provider Notes (Signed)
[facility] EMERGENCY DEPARTMENT AT [facility] Provider Note   CSN: [id number] Arrival date & time: 07/02/23  1402     History  Chief Complaint  Patient presents with  . Hypertension    Raymond Roberson is a 67 y.o. male.  He has a history of cardiac disease NSTEMI, one-vessel CABG, aortic valve stenosis status post SAVR, stroke, hypertension, hyperlipidemia, alcohol use.  He said for the last few weeks his heart rate has been elevated.  For the last few days his blood pressure has been elevated.  Has been nauseous.  Has talked to his doctors and they recommended he come here for further evaluation.  It sounds like he is on Plavix, losartan, spironolactone and was taking metoprolol 12.5 twice daily.  Just increase that to 25 twice daily.  Symptoms are not improved.  He also stopped drinking yesterday, was drinking 12 beers a day.  He denies any chest pain or shortness of breath.  The history is provided by the patient.  Hypertension This is a recurrent problem. The current episode started more than 2 days ago. The problem occurs constantly. The problem has not changed since onset.Pertinent negatives include no chest pain, no abdominal pain, no headaches and no shortness of breath. Nothing aggravates the symptoms. Nothing relieves the symptoms. He has tried nothing for the symptoms. The treatment provided no relief.       Home Medications Prior to Admission medications   Medication Sig Start Date End Date Taking? Authorizing Provider  ALPRAZolam Prudy Feeler) 0.5 MG tablet Take 0.5 mg by mouth 2 (two) times daily.    [provider]  aspirin 81 MG tablet Take 81 mg by mouth daily.    [provider]  atorvastatin (LIPITOR) 80 MG tablet Take 1 tablet (80 mg total) by mouth daily. 05/31/23   Mallipeddi, Vishnu P, MD  citalopram (CELEXA) 40 MG tablet Take 20 mg by mouth daily.    [provider]  clopidogrel (PLAVIX) 75 MG tablet Take 1 tablet (75 mg total) by  mouth daily. 04/10/23   Mallipeddi, Vishnu P, MD  esomeprazole (NEXIUM) 40 MG capsule Take 40 mg by mouth daily. 04/27/22   [provider]  HYDROcodone-acetaminophen (NORCO/VICODIN) 5-325 MG tablet Take 1 tablet by mouth 4 (four) times daily.    [provider]  hydrocortisone (ANUSOL-HC) 2.5 % rectal cream Place 1 application  rectally daily as needed for hemorrhoids or anal itching.    [provider]  levocetirizine (XYZAL) 5 MG tablet Take 5 mg by mouth every evening.    [provider]  losartan (COZAAR) 25 MG tablet Take 25 mg by mouth daily. 03/29/23   [provider]  metoprolol tartrate (LOPRESSOR) 25 MG tablet Take 0.5 tablets (12.5 mg total) by mouth 2 (two) times daily. 04/10/23   Mallipeddi, Vishnu P, MD  Multiple Vitamins-Minerals (MULTIVITAMIN WITH MINERALS) tablet Take 1 tablet by mouth daily. Patient not taking: Reported on 04/10/2023    [provider]  ondansetron (ZOFRAN) 8 MG tablet Take 8 mg by mouth once a week. 04/10/22   [provider]  sildenafil (VIAGRA) 100 MG tablet Take 100 mg by mouth daily as needed for erectile dysfunction. 01/17/23   [provider]  spironolactone (ALDACTONE) 25 MG tablet TAKE 1 TABLET ONCE DAILY 05/31/23   Mallipeddi, Vishnu P, MD      Allergies    Naproxen and Sulfa antibiotics    Review of Systems   Review of Systems  Respiratory:  Negative for shortness of breath.   Cardiovascular:  Negative for chest pain.  Gastrointestinal:  Positive for nausea. Negative for abdominal pain.  Genitourinary:  Negative for dysuria.  Neurological:  Positive for tremors. Negative for headaches.    Physical Exam Updated Vital Signs BP (!) 157/118 (BP Location: Right Arm)   Pulse (!) 139   Temp 97.8 F (36.6 C)   Resp (!) 22   Ht 6' (1.829 m)   Wt 81.6 kg   SpO2 99%   BMI 24.41 kg/m  Physical Exam Vitals and nursing note reviewed.  Constitutional:      General: He is not in acute  distress.    Appearance: Normal appearance. He is well-developed. He is diaphoretic.  HENT:     Head: Normocephalic and atraumatic.  Eyes:     Conjunctiva/sclera: Conjunctivae normal.  Cardiovascular:     Rate and Rhythm: Regular rhythm. Tachycardia present.     Heart sounds: No murmur heard. Pulmonary:     Effort: Pulmonary effort is normal. No respiratory distress.     Breath sounds: Normal breath sounds.  Abdominal:     Palpations: Abdomen is soft.     Tenderness: There is no abdominal tenderness. There is no guarding or rebound.  Musculoskeletal:        General: No deformity. Normal range of motion.     Cervical back: Neck supple.  Skin:    General: Skin is warm.     Capillary Refill: Capillary refill takes less than 2 seconds.  Neurological:     General: No focal deficit present.     Mental Status: He is alert.     Motor: No weakness.     Gait: Gait normal.     ED Results / Procedures / Treatments   Labs (all labs ordered are listed, but only abnormal results are displayed) Labs Reviewed  COMPREHENSIVE METABOLIC PANEL - Abnormal; Notable for the following components:      Result Value   Sodium 134 (*)    Glucose, Bld 107 (*)    Total Protein 8.3 (*)    Total Bilirubin 1.3 (*)    All other components within normal limits  CBC WITH DIFFERENTIAL/PLATELET - Abnormal; Notable for the following components:   Hemoglobin 11.7 (*)    MCV 75.5 (*)    MCH 21.7 (*)    MCHC 28.7 (*)    RDW 16.3 (*)    All other components within normal limits  URINALYSIS, ROUTINE W REFLEX MICROSCOPIC - Abnormal; Notable for the following components:   Color, Urine STRAW (*)    Specific Gravity, Urine 1.003 (*)    All other components within normal limits  TROPONIN I (HIGH SENSITIVITY) - Abnormal; Notable for the following components:   Troponin I (High Sensitivity) 25 (*)    All other components within normal limits  TROPONIN I (HIGH SENSITIVITY) - Abnormal; Notable for the following  components:   Troponin I (High Sensitivity) 25 (*)    All other components within normal limits  LIPASE, BLOOD  MAGNESIUM    EKG EKG Interpretation Date/Time:  Monday July 02 2023 14:17:32 EDT Ventricular Rate:  140 PR Interval:  120 QRS Duration:  124 QT Interval:  262 QTC Calculation: 399 R Axis:   -43  Text Interpretation: Sinus tachycardia with Premature atrial complexes with Abberant conduction Left axis deviation Minimal voltage criteria for LVH, may be normal variant ( Cornell product ) Inferior infarct , age undetermined Abnormal ECG When compared with ECG of  24-Jan-2023 06:53, Significant changes have occurred Confirmed by Meridee Score [phone]) on 07/02/2023 2:20:48 PM  Radiology DG Chest Port 1 View  Result Date: 07/02/2023 CLINICAL DATA:  Hypertension EXAM: PORTABLE CHEST 1 VIEW COMPARISON:  03/07/2023 FINDINGS: Stable cardiomegaly status post sternotomy and aortic valve replacement. Minimal left basilar scarring or atelectasis. Lungs are otherwise clear. No pleural effusion or pneumothorax. IMPRESSION: Minimal left basilar scarring or atelectasis. No acute cardiopulmonary findings. Electronically Signed   By: Duanne Guess D.O.   On: 07/02/2023 15:16    Procedures .Critical Care  Performed by: Terrilee Files, MD Authorized by: Terrilee Files, MD   Critical care provider statement:    Critical care time (minutes):  45   Critical care time was exclusive of:  Separately billable procedures and treating other patients   Critical care was necessary to treat or prevent imminent or life-threatening deterioration of the following conditions:  Circulatory failure and cardiac failure   Critical care was time spent personally by me on the following activities:  Development of treatment plan with patient or surrogate, discussions with consultants, evaluation of patient's response to treatment, examination of patient, obtaining history from patient or surrogate, ordering and  performing treatments and interventions, ordering and review of laboratory studies, ordering and review of radiographic studies, pulse oximetry, re-evaluation of patient's condition and review of old charts   I assumed direction of critical care for this patient from another provider in my specialty: no       Medications Ordered in ED Medications  ondansetron (ZOFRAN) injection 4 mg (has no administration in time range)  sodium chloride 0.9 % bolus 1,000 mL (has no administration in time range)  LORazepam (ATIVAN) injection 2 mg (has no administration in time range)  LORazepam (ATIVAN) tablet 1-4 mg (has no administration in time range)    Or  LORazepam (ATIVAN) injection 1-4 mg (has no administration in time range)  thiamine (VITAMIN B1) tablet 100 mg (has no administration in time range)    Or  thiamine (VITAMIN B1) injection 100 mg (has no administration in time range)  folic acid (FOLVITE) tablet 1 mg (has no administration in time range)  multivitamin with minerals tablet 1 tablet (has no administration in time range)    ED Course/ Medical Decision Making/ A&P Clinical Course as of 07/02/23 1735  Mon Jul 02, 2023  1520 Chest x-ray interpreted by me as cardiomegaly no gross infiltrate.  Awaiting radiology reading. [MB]  1605 Discussed with Dr. Diona Browner cardiology.  He said the patient would need to be admitted for further rate control.  He will make recommendations as far as anticoagulation in consult. [MB]  1617 discussed with Dr. Mariea Clonts [facility] hospitalist who will evaluate patient for admission. [MB]    Clinical Course User Index [MB] Terrilee Files, MD                             Medical Decision Making Amount and/or Complexity of Data Reviewed Labs: ordered. Radiology: ordered.  Risk OTC drugs. Prescription drug management. Decision regarding hospitalization.   This patient complains of elevated heart rate and blood pressure; this involves an extensive number of  treatment Options and is a complaint that carries with it a high risk of complications and morbidity. The differential includes arrhythmia, anemia, infection, withdrawal, sepsis  I ordered, reviewed and interpreted labs, which included CBC with normal white count, hemoglobin slightly down, chemistries fairly unremarkable, troponins mildly elevated need  to be trended I ordered medication IV fluids IV beta-blocker IV Ativan and reviewed PMP when indicated. I ordered imaging studies which included chest x-ray and I independently    visualized and interpreted imaging which showed no definite infiltrate Additional history obtained from patient's family member Previous records obtained and reviewed in epic including recent cardiology notes I consulted cardiology Dr. Diona Browner and [facility] hospitalist Dr. Mariea Clonts I and discussed lab and imaging findings and discussed disposition.  Cardiac monitoring reviewed, atrial tachycardia slowing down to atrial flutter Social determinants considered, ongoing tobacco and alcohol use Critical Interventions: Initiation of rate control with IV beta-blocker, initiation of alcohol withdrawal treatment with IV benzos  After the interventions stated above, I reevaluated the patient and found patient's heart rate and blood pressure to be trending down a little bit. Admission and further testing considered, he would benefit from mission to the hospital, likely will need cardioversion after anticoagulation and further treatment of possible impending alcohol withdrawal.  Patient in agreement with plan for admission.          Final Clinical Impression(s) / ED Diagnoses Final diagnoses:  Atrial flutter with rapid ventricular response (HCC)  Resistant hypertension  Alcohol withdrawal syndrome with complication [facility])    Rx / DC Orders ED Discharge Orders     None         Terrilee Files, MD 07/02/23 1735

## 2023-07-02 NOTE — Assessment & Plan Note (Signed)
Echo 02/2023, EF of 40 to 45%, grade 1 DD.  Bioprosthetic aortic valve. Not on diuretics.

## 2023-07-02 NOTE — Discharge Instructions (Signed)

## 2023-07-02 NOTE — Assessment & Plan Note (Addendum)
Presenting with tachycardia, tremulousness, initial diaphoresis.  Alcoholic beverage was last night at about 10 PM. At baseline drinks about 12 beers per day.  He has had prior hx of alcohol withdrawal symptoms but not to this degree, no seizure history. -CIWA as needed and scheduled - Thiamine folate Multivits. - Patient still quite agitated despite 4mg  of ativan 1 hr apart. Will start Precedex.

## 2023-07-02 NOTE — Assessment & Plan Note (Signed)
Resume home narcotics. Reports intermittent bilateral upper and lower extremity numbness and tingling. No neck pain, no other neurologic deficits. Hx of alcohol abuse and DM, not on medications. Possible peripheral neuropathy- likely 2/2 alcohol/DM.

## 2023-07-02 NOTE — H&P (Addendum)
History and Physical    YAZN ETUE UJW:[id number] DOB: [birth year] DOA: 07/02/2023  PCP: Richardean Chimera, MD   Patient coming from: Home  I have personally briefly reviewed patient's old medical records in [facility]  Chief Complaint: Increased heart rate  HPI: Raymond Roberson is a 67 y.o. male with medical history significant for CABG, Ischemic cardiomyopathy, HTN, systolic CHF.  Patient presented to the ED with complaints of elevated blood pressure and heart rate for the past few weeks.  Patient had CABG January 2024, reports has had barely any chest pain since then.  Reports dizziness and difficulty breathing today when standing.  Also reports onset of diaphoresis and tremulousness today. He also reports intermittent numbness and tingling to his bilateral upper and lower extremities since his surgery., no neck pain but he has chronic back pain that is unchanged.  No weakness of his extremities, no facial assymmetry and no speech changes.  ED Course: Heart rate 109-139, respiratory rate 21 - 27, blood pressure systolic 112- 157.  O2 sats greater than 98% on room. Troponin 25 x 2.:  Unremarkable CBC, CMP. Chest x-ray shows mild scarring. EKG- Atria Fib/flutter, rate 125. Cardiology consulted, recommends starting Eliquis, Amidoarone drip, ECHO when rates better controlled, continue Aspirin, hold plavix, add metop if BP will allow, possible cardioversion planned.  Review of Systems: As per HPI all other systems reviewed and negative.  Past Medical History:  Diagnosis Date   Acute angina (HCC)    Bicuspid aortic valve    Chronic alcohol abuse    Classic migraine 09/02/2013   CVA (cerebral vascular accident) (HCC)    Diabetes mellitus without complication (HCC)    Dizziness and giddiness 09/02/2013   GERD (gastroesophageal reflux disease)    Gouty arthritis    Hypertension    Major depression    Other and unspecified hyperlipidemia    Peptic ulcer disease    Sleep apnea      Past Surgical History:  Procedure Laterality Date   AORTIC VALVE REPLACEMENT N/A 01/23/2023   Procedure: AORTIC VALVE REPLACEMENT (AVR) USING 27 INSPIRIS VALVE;  Surgeon: Alleen Borne, MD;  Location: [facility] OR;  Service: Open Heart Surgery;  Laterality: N/A;   BIOPSY  07/21/2022   Procedure: BIOPSY;  Surgeon: Dolores Frame, MD;  Location: [facility] END[facility] SUITE;  Service: Gastroenterology;;   CIRCUMCISION     COLONOSCOPY WITH PROPOFOL N/A 07/21/2022   Procedure: COLONOSCOPY WITH PROPOFOL;  Surgeon: Dolores Frame, MD;  Location: [facility];  Service: Gastroenterology;  Laterality: N/A;  11:15 ASA 2   CORONARY ARTERY BYPASS GRAFT N/A 01/23/2023   Procedure: CORONARY ARTERY BYPASS GRAFTING (CABG) 1 TIMES  USING LEFT INTERNAL MAMMARY ARTERY,  RIGHT SAPHENOUS LEG VEIN HARVESTED ENDOSCOPICALLY;  Surgeon: Alleen Borne, MD;  Location: [facility] OR;  Service: Open Heart Surgery;  Laterality: N/A;   CYST REMOVAL TRUNK  11/24/2012   back   INGUINAL HERNIA REPAIR Bilateral    POLYPECTOMY  07/21/2022   Procedure: POLYPECTOMY;  Surgeon: Dolores Frame, MD;  Location: [facility];  Service: Gastroenterology;;   RHINOPLASTY     RIGHT HEART CATH AND CORONARY ANGIOGRAPHY N/A 01/22/2023   Procedure: RIGHT HEART CATH AND CORONARY ANGIOGRAPHY;  Surgeon: Runell Gess, MD;  Location: [facility] INVASIVE CV LAB;  Service: Cardiovascular;  Laterality: N/A;   TEE WITHOUT CARDIOVERSION N/A 01/23/2023   Procedure: TRANSESOPHAGEAL ECHOCARDIOGRAM;  Surgeon: Alleen Borne, MD;  Location: [facility] OR;  Service: Open Heart Surgery;  Laterality: N/A;     reports that he has never smoked. His smokeless tobacco use includes snuff. He reports that he does not currently use alcohol after a past usage of about 84.0 standard drinks of alcohol per week. He reports that he does not use drugs.  Allergies  Allergen Reactions   Naproxen Nausea Only   Sulfa Antibiotics Nausea Only    Family History  Problem  Relation Age of Onset   Allergies Mother    Anxiety disorder Mother    CAD Father    Heart failure Father     Prior to Admission medications   Medication Sig Start Date End Date Taking? Authorizing Provider  ALPRAZolam Prudy Feeler) 0.5 MG tablet Take 0.5 mg by mouth 2 (two) times daily.    [provider]  aspirin 81 MG tablet Take 81 mg by mouth daily.    [provider]  atorvastatin (LIPITOR) 80 MG tablet Take 1 tablet (80 mg total) by mouth daily. 05/31/23   Mallipeddi, Vishnu P, MD  citalopram (CELEXA) 40 MG tablet Take 20 mg by mouth daily.    [provider]  clopidogrel (PLAVIX) 75 MG tablet Take 1 tablet (75 mg total) by mouth daily. 04/10/23   Mallipeddi, Vishnu P, MD  esomeprazole (NEXIUM) 40 MG capsule Take 40 mg by mouth daily. 04/27/22   [provider]  HYDROcodone-acetaminophen (NORCO/VICODIN) 5-325 MG tablet Take 1 tablet by mouth 4 (four) times daily.    [provider]  hydrocortisone (ANUSOL-HC) 2.5 % rectal cream Place 1 application  rectally daily as needed for hemorrhoids or anal itching.    [provider]  levocetirizine (XYZAL) 5 MG tablet Take 5 mg by mouth every evening.    [provider]  losartan (COZAAR) 25 MG tablet Take 25 mg by mouth daily. 03/29/23   [provider]  metoprolol tartrate (LOPRESSOR) 25 MG tablet Take 0.5 tablets (12.5 mg total) by mouth 2 (two) times daily. 04/10/23   Mallipeddi, Vishnu P, MD  Multiple Vitamins-Minerals (MULTIVITAMIN WITH MINERALS) tablet Take 1 tablet by mouth daily. Patient not taking: Reported on 04/10/2023    [provider]  ondansetron (ZOFRAN) 8 MG tablet Take 8 mg by mouth once a week. 04/10/22   [provider]  sildenafil (VIAGRA) 100 MG tablet Take 100 mg by mouth daily as needed for erectile dysfunction. 01/17/23   [provider]  spironolactone (ALDACTONE) 25 MG tablet TAKE 1 TABLET ONCE DAILY 05/31/23   Marjo Bicker, MD     Physical Exam: Vitals:   07/02/23 1413 07/02/23 1418 07/02/23 1530 07/02/23 1545  BP:  (!) 157/118 117/88 112/88  Pulse:  (!) 139 (!) 109 (!) 124  Resp:  (!) 22 (!) 27 (!) 21  Temp:  97.8 F (36.6 C)    SpO2:  99% 100% 98%  Weight: 81.6 kg     Height: 6' (1.829 m)       Constitutional: NAD, calm, comfortable Vitals:   07/02/23 1413 07/02/23 1418 07/02/23 1530 07/02/23 1545  BP:  (!) 157/118 117/88 112/88  Pulse:  (!) 139 (!) 109 (!) 124  Resp:  (!) 22 (!) 27 (!) 21  Temp:  97.8 F (36.6 C)    SpO2:  99% 100% 98%  Weight: 81.6 kg     Height: 6' (1.829 m)      Eyes: PERRL, lids and conjunctivae normal ENMT: Mucous membranes are moist.  Neck: normal, supple, no masses, no thyromegaly Respiratory: clear to auscultation  bilaterally, no wheezing, no crackles. Normal respiratory effort. No accessory muscle use.  Cardiovascular: Regular rate and rhythm, no murmurs / rubs / gallops. No extremity edema.  Abdomen: no tenderness, no masses palpated. No hepatosplenomegaly. Bowel sounds positive.  Musculoskeletal: no clubbing / cyanosis. No joint deformity upper and lower extremities.  Skin: no rashes, lesions, ulcers. No induration Neurologic: No facial asymmetry sensation globally intact, including abdominal extremities, speech clear and fluent without aphasia, 5/+5 strength in all Extremities. Psychiatric: Normal judgment and insight. Alert and oriented x 3. Normal mood.  Mild tremulousness of upper extremities on exam.  Labs on Admission: I have personally reviewed following labs and imaging studies  CBC: Recent Labs  Lab 07/02/23 1449  WBC 8.1  NEUTROABS 6.9  HGB 11.7*  HCT 40.7  MCV 75.5*  PLT 299   Basic Metabolic Panel: Recent Labs  Lab 07/02/23 1449  NA 134*  K 3.8  CL 99  CO2 26  GLUCOSE 107*  BUN 13  CREATININE 0.84  CALCIUM 9.3  MG 2.1   GFR: Estimated Creatinine Clearance: 97.5 mL/min (by C-G formula based on SCr of 0.84 mg/dL). Liver Function  Tests: Recent Labs  Lab 07/02/23 1449  AST 28  ALT 30  ALKPHOS 62  BILITOT 1.3*  PROT 8.3*  ALBUMIN 4.0   Recent Labs  Lab 07/02/23 1449  LIPASE 47   Urine analysis:    Component Value Date/Time   COLORURINE STRAW (A) 07/02/2023 1504   APPEARANCEUR CLEAR 07/02/2023 1504   LABSPEC 1.003 (L) 07/02/2023 1504   PHURINE 6.0 07/02/2023 1504   GLUCOSEU NEGATIVE 07/02/2023 1504   HGBUR NEGATIVE 07/02/2023 1504   BILIRUBINUR NEGATIVE 07/02/2023 1504   KETONESUR NEGATIVE 07/02/2023 1504   PROTEINUR NEGATIVE 07/02/2023 1504   NITRITE NEGATIVE 07/02/2023 1504   LEUKOCYTESUR NEGATIVE 07/02/2023 1504    Radiological Exams on Admission: DG Chest Port 1 View  Result Date: 07/02/2023 CLINICAL DATA:  Hypertension EXAM: PORTABLE CHEST 1 VIEW COMPARISON:  03/07/2023 FINDINGS: Stable cardiomegaly status post sternotomy and aortic valve replacement. Minimal left basilar scarring or atelectasis. Lungs are otherwise clear. No pleural effusion or pneumothorax. IMPRESSION: Minimal left basilar scarring or atelectasis. No acute cardiopulmonary findings. Electronically Signed   By: Duanne Guess D.O.   On: 07/02/2023 15:16    EKG: Independently reviewed. Atria Fib/Flutter rate 125.   Assessment/Plan Principal Problem:   Alcohol withdrawal (HCC) Active Problems:   Atrial flutter with rapid ventricular response (HCC)   HTN (hypertension)   S/P AVR   Ischemic cardiomyopathy   S/P CABG x 1   Assessment and Plan: * Alcohol withdrawal (HCC) Presenting with tachycardia, tremulousness, initial diaphoresis.  Alcoholic beverage was last night at about 10 PM. At baseline drinks about 12 beers per day.  He has had prior hx of alcohol withdrawal symptoms but not to this degree, no seizure history. -CIWA as needed and scheduled - Thiamine folate Multivits. - Patient still quite agitated despite 4mg  of ativan 1 hr apart. Will start Precedex. -Patient delirious 2/2 alcohol withdrawal, requesting to be  discharged, pulling against restraints, tremulous, talking about children he has to go back home to, but significant other at bedside states this is not true, at risk for seizures 2/2 alcohol withdrawal, patient had to be IVc'd, paperwork signed.  - Addendum-patient's increasingly agitated despite receiving a total of 14mg   of Ativan within 4 hours, patient pacing, increasing troponins, with verbally threatening behavior, Precedex started and maxed out, soft restraints of bilateral wrist, patient attempting to  get out of bed, delirious.  Haldol 5 mg IM also given.  2 Tax adviser at bedside, Emergency planning/management officer at bedside also.  I talked to the ED provider, Dr. Hyacinth Meeker graciously agreed to come up and intubate patient.  Propofol drip started. I talked to Intensivist on-call at [facility], patient graciously accepted to ICU.  Request chest x-ray, ABG, stop Precedex drip while on propofol. Continue As needed Ativan and fentanyl. - 1l bolus given earlier, Start normal saline 75cc/hr x 1 day - Foley -ABG, chest x-ray a.m.   Atrial flutter with rapid ventricular response (HCC) New onset atrial flutter. Heart rates up to 134.  In the setting of daily alcohol abuse and withdrawal.  EKG showing atrial flutter. -Seen by cardiology Dr. Cindra Eves, recommended IV amiodarone, continue Lopressor  and uptitrate as tolerated. Start Eliquis twice daily, continue low-dose aspirin, d/c Plavix, remain on bp meds if blood pressure allows, possible TEE guided cardioversion after 3-4 doses of eliquis. -Please order echo when heart rate is better controlled. - Home metoprolol 12.5mg  BID has been resumed  Chronic back pain Resume home narcotics. Reports intermittent bilateral upper and lower extremity numbness and tingling. No neck pain, no other neurologic deficits. Hx of alcohol abuse and DM, not on medications. Possible peripheral neuropathy- likely 2/2 alcohol/DM.  S/P CABG x 1 Recent CABG 12/2022. Also  underwent AV replacement. -Eliquis started -Resume aspirin -Hold Plavix  Ischemic cardiomyopathy Echo 02/2023, EF of 40 to 45%, grade 1 DD.  Bioprosthetic aortic valve. Not on diuretics.  HTN (hypertension) Blood pressure stable at this time. - Resume Metoprolol 12.5 mg twice daily - Hold Lorsatan, spironolactone, for now, pending blood pressure   CRITICAL CARE Performed by: Onnie Boer   Total critical care time: 70 minutes  Critical care time was exclusive of separately billable procedures and treating other patients.  Critical care was necessary to treat or prevent imminent or life-threatening deterioration.  Critical care was time spent personally by me on the following activities: development of treatment plan with patient and/or surrogate as well as nursing, discussions with consultants, evaluation of patient's response to treatment, examination of patient, obtaining history from patient or surrogate, ordering and performing treatments and interventions, ordering and review of laboratory studies, ordering and review of radiographic studies, pulse oximetry and re-evaluation of patient's condition. Care   DVT prophylaxis: Eliquis Code Status:  FULL code- Confirmed with patient at bedside Family Communication: Significant other - Crystal at bedside. Disposition Plan: ~ 2 days Consults called: None  Admission status: Inpt Stepdown I certify that at the point of admission it is my clinical judgment that the patient will require inpatient hospital care spanning beyond 2 midnights from the point of admission due to high intensity of service, high risk for further deterioration and high frequency of surveillance required.  Author: Onnie Boer, MD 07/02/2023 12:10 AM  For on call review [email or web address].

## 2023-07-02 NOTE — Assessment & Plan Note (Addendum)
Blood pressure stable at this time. - Resume Metoprolol 12.5 mg twice daily - Hold Lorsatan, spironolactone, for now, pending blood pressure

## 2023-07-02 NOTE — Consult Note (Signed)
Cardiology Consultation:   Patient ID: Raymond Roberson; [id number]; 05/19/1956   Admit date: 07/02/2023 Date of Consult: 07/02/2023  Primary Care Provider: Richardean Chimera, MD Primary Cardiologist: Marjo Bicker, MD  History of Present Illness:   Raymond Roberson is a 67 y.o. male with past medical history outlined below presenting to the [facility] ER with concerns about elevated heart rate and blood pressure, intermittent sense of palpitations, shortness of breath and fatigue.  I reviewed his records in detail.  He is status post NSTEMI in January, found to have an occluded ostial RCA and 90% proximal to mid LAD stenosis along with progressive aortic stenosis in the setting of bicuspid aortic valve.  He underwent CABG with LIMA to LAD and placement of 27 mm Edwards Inspiris Resilia pericardial AVR at the time.  His last echocardiogram in March revealed reduced LVEF in the range of 40 to 45% with stable AVR.  He has a history of alcohol abuse for quite some time, states that he has been drinking up to 12 beers a day, has not been able to cut back.  Last alcohol intake was yesterday.  He states that he largely takes his medications regularly although does miss some.  He indicates that his heart rate has been elevated for several weeks now, did call the office in late June with heart rate reportedly 146 and recommendation by Dr. Jenene Slicker to increase beta-blocker dose.  His blood pressure has also been elevated recently.  ROS:  Pertinent review in history of present illness.  No syncope.  No orthopnea or PND.  Past Medical History:  Diagnosis Date   Aortic stenosis    Status post AVR with 27 mm Edwards INSPIRIS RESILIA pericardial valve January 2024   Bicuspid aortic valve    CAD (coronary artery disease)    Status post LIMA to LAD January 2024   Chronic alcohol abuse    Classic migraine 09/02/2013   CVA (cerebral vascular accident) (HCC)    Diabetes mellitus without complication  (HCC)    GERD (gastroesophageal reflux disease)    Gouty arthritis    Hypertension    Major depression    Mixed hyperlipidemia    Peptic ulcer disease    Sleep apnea     Past Surgical History:  Procedure Laterality Date   AORTIC VALVE REPLACEMENT N/A 01/23/2023   Procedure: AORTIC VALVE REPLACEMENT (AVR) USING 27 INSPIRIS VALVE;  Surgeon: Alleen Borne, MD;  Location: [facility] OR;  Service: Open Heart Surgery;  Laterality: N/A;   BIOPSY  07/21/2022   Procedure: BIOPSY;  Surgeon: Dolores Frame, MD;  [facility]: [facility];  Service: Gastroenterology;;   CIRCUMCISION     COLONOSCOPY WITH PROPOFOL N/A 07/21/2022   Procedure: COLONOSCOPY WITH PROPOFOL;  Surgeon: Dolores Frame, MD;  Location: [facility];  Service: Gastroenterology;  Laterality: N/A;  11:15 ASA 2   CORONARY ARTERY BYPASS GRAFT N/A 01/23/2023   Procedure: CORONARY ARTERY BYPASS GRAFTING (CABG) 1 TIMES  USING LEFT INTERNAL MAMMARY ARTERY,  RIGHT SAPHENOUS LEG VEIN HARVESTED ENDOSCOPICALLY;  Surgeon: Alleen Borne, MD;  Location: [facility] OR;  Service: Open Heart Surgery;  Laterality: N/A;   CYST REMOVAL TRUNK  11/24/2012   back   INGUINAL HERNIA REPAIR Bilateral    POLYPECTOMY  07/21/2022   Procedure: POLYPECTOMY;  Surgeon: Dolores Frame, MD;  [facility]: [facility];  Service: Gastroenterology;;   RHINOPLASTY     RIGHT HEART CATH AND CORONARY ANGIOGRAPHY N/A 01/22/2023  Procedure: RIGHT HEART CATH AND CORONARY ANGIOGRAPHY;  Surgeon: Runell Gess, MD;  Location: [facility] INVASIVE CV LAB;  Service: Cardiovascular;  Laterality: N/A;   TEE WITHOUT CARDIOVERSION N/A 01/23/2023   Procedure: TRANSESOPHAGEAL ECHOCARDIOGRAM;  Surgeon: Alleen Borne, MD;  Location: [facility] OR;  Service: Open Heart Surgery;  Laterality: N/A;     Outpatient Medications: No current facility-administered medications on file prior to encounter.   Current Outpatient Medications on File Prior to Encounter  Medication Sig Dispense  Refill   ALPRAZolam (XANAX) 0.5 MG tablet Take 0.5 mg by mouth 2 (two) times daily.     aspirin 81 MG tablet Take 81 mg by mouth daily.     atorvastatin (LIPITOR) 80 MG tablet Take 1 tablet (80 mg total) by mouth daily. 30 tablet 6   citalopram (CELEXA) 40 MG tablet Take 20 mg by mouth daily.     clopidogrel (PLAVIX) 75 MG tablet Take 1 tablet (75 mg total) by mouth daily. 90 tablet 1   esomeprazole (NEXIUM) 40 MG capsule Take 40 mg by mouth daily.     HYDROcodone-acetaminophen (NORCO/VICODIN) 5-325 MG tablet Take 1 tablet by mouth 4 (four) times daily.     hydrocortisone (ANUSOL-HC) 2.5 % rectal cream Place 1 application  rectally daily as needed for hemorrhoids or anal itching.     levocetirizine (XYZAL) 5 MG tablet Take 5 mg by mouth every evening.     losartan (COZAAR) 25 MG tablet Take 25 mg by mouth daily.     metoprolol tartrate (LOPRESSOR) 25 MG tablet Take 0.5 tablets (12.5 mg total) by mouth 2 (two) times daily. 90 tablet 1   Multiple Vitamins-Minerals (MULTIVITAMIN WITH MINERALS) tablet Take 1 tablet by mouth daily. (Patient not taking: Reported on 04/10/2023)     ondansetron (ZOFRAN) 8 MG tablet Take 8 mg by mouth once a week.     sildenafil (VIAGRA) 100 MG tablet Take 100 mg by mouth daily as needed for erectile dysfunction.     spironolactone (ALDACTONE) 25 MG tablet TAKE 1 TABLET ONCE DAILY 30 tablet 8     Allergies:    Allergies  Allergen Reactions   Naproxen Nausea Only   Sulfa Antibiotics Nausea Only    Social History:   Social History   Tobacco Use   Smoking status: Never   Smokeless tobacco: Current    Types: Snuff  Substance Use Topics   Alcohol use: Yes    Alcohol/week: 84.0 standard drinks of alcohol    Types: 84 Cans of beer per week    Family History:   The patient's family history includes Allergies in his mother; Anxiety disorder in his mother; CAD in his father; Heart failure in his father.  Physical Exam/Data:   Vitals:   07/02/23 1413 07/02/23  1418 07/02/23 1530 07/02/23 1545  BP:  (!) 157/118 117/88 112/88  Pulse:  (!) 139 (!) 109 (!) 124  Resp:  (!) 22 (!) 27 (!) 21  Temp:  97.8 F (36.6 C)    SpO2:  99% 100% 98%  Weight: 81.6 kg     Height: 6' (1.829 m)      No intake or output data in the 24 hours ending 07/02/23 1631 Filed Weights   07/02/23 1413  Weight: 81.6 kg   Body mass index is 24.41 kg/m.   Gen: Patient appears comfortable at rest. HEENT: Conjunctiva and lids normal. Neck: Supple, no elevated JVP or carotid bruits. Lungs: Clear to auscultation, nonlabored breathing at rest. Cardiac: Irregularly irregular,  no S3, 2/6 systolic murmur. Abdomen: Soft, bowel sounds present. Extremities: Mild ankle edema. Skin: Warm and dry. Musculoskeletal: No kyphosis. Neuropsychiatric: Alert and oriented x3, affect grossly appropriate.  EKG: Tracings from today are consistent with atrial flutter, initially with 2-1 block and then variable block after IV beta-blocker.  Also LVH with repolarization abnormalities.  Telemetry:  I personally reviewed telemetry which shows atrial flutter.  Relevant CV Studies:  Echocardiogram 03/07/2023:  1. Left ventricular ejection fraction, by estimation, is 40 to 45%. The  left ventricle has mildly decreased function. The left ventricle  demonstrates regional wall motion abnormalities (see scoring  diagram/findings for description). There is mild left  ventricular hypertrophy. Left ventricular diastolic parameters are  consistent with Grade I diastolic dysfunction (impaired relaxation).   2. Right ventricular systolic function is normal. The right ventricular  size is normal.   3. The mitral valve is normal in structure. Trivial mitral valve  regurgitation. No evidence of mitral stenosis.   4. The aortic valve has been repaired/replaced. Aortic valve  regurgitation is not visualized. No aortic stenosis is present. There is a  bioprosthetic valve present in the aortic position.   5.  The inferior vena cava is normal in size with greater than 50%  respiratory variability, suggesting right atrial pressure of 3 mmHg.   Laboratory Data:  Chemistry Recent Labs  Lab 07/02/23 1449  NA 134*  K 3.8  CL 99  CO2 26  GLUCOSE 107*  BUN 13  CREATININE 0.84  CALCIUM 9.3  GFRNONAA >60  ANIONGAP 9    Recent Labs  Lab 07/02/23 1449  PROT 8.3*  ALBUMIN 4.0  AST 28  ALT 30  ALKPHOS 62  BILITOT 1.3*   Hematology Recent Labs  Lab 07/02/23 1449  WBC 8.1  RBC 5.39  HGB 11.7*  HCT 40.7  MCV 75.5*  MCH 21.7*  MCHC 28.7*  RDW 16.3*  PLT 299   Cardiac Enzymes Recent Labs  Lab 07/02/23 1449  TROPONINIHS 25*    Radiology/Studies:  DG Chest Port 1 View  Result Date: 07/02/2023 CLINICAL DATA:  Hypertension EXAM: PORTABLE CHEST 1 VIEW COMPARISON:  03/07/2023 FINDINGS: Stable cardiomegaly status post sternotomy and aortic valve replacement. Minimal left basilar scarring or atelectasis. Lungs are otherwise clear. No pleural effusion or pneumothorax. IMPRESSION: Minimal left basilar scarring or atelectasis. No acute cardiopulmonary findings. Electronically Signed   By: Duanne Guess D.O.   On: 07/02/2023 15:16    Assessment and Plan:   1.  Newly documented typical atrial flutter with RVR, persistent and likely present for several weeks.  CHA2DS2-VASc score is 6.  2.  Multivessel CAD with occluded ostial RCA and status post CABG with LIMA to LAD in January of this year.  He has been on aspirin and Plavix following NSTEMI at that time.  Also on Lipitor.  3.  HFmrEF, LVEF 40 to 45% by echocardiogram in March, wall motion abnormalities suggestive of ischemic cardiomyopathy.  Recent outpatient GDMT has included metoprolol, Cozaar, and Aldactone.  4.  Chronic recurring alcohol abuse as discussed above.  5.  Essential hypertension, recent blood pressure trend has been up.  6.  History of bicuspid aortic valve with aortic stenosis status post AVR with 27 mm Edwards  Inspiris Resilia pericardial valve in January of this year.  Reviewed with hospitalist team in terms of admission plan.  Initiate IV amiodarone and continue Lopressor at 25 mg twice daily with up titration as tolerated.  Start Eliquis 5 mg twice daily.  Can continue low-dose aspirin but would stop Plavix at this point.  Can remain on Cozaar and Aldactone if blood pressure allows.  Would anticipate a TEE guided cardioversion after 3-4 doses of Eliquis, although his situation will likely be complicated by high risk of alcohol withdrawal and this may need to be put off until he is further stabilized.  Follow-up echocardiogram is also recommended, but would defer until his heart rate/rhythm are controlled.  Alcohol cessation will be critically important in terms of management of his cardiomyopathy and rhythm.  For questions or updates, please contact [facility] Please consult [email or web address] for contact info under   Signed, Nona Dell, MD  07/02/2023 4:31 PM

## 2023-07-02 NOTE — ED Provider Notes (Signed)
I was called to the patient's bedside due to respiratory distress and severe agitation, patient had been admitted earlier in the day, hospitalist called me and asked me to come and intubate the patient due to worsening condition and need to control airway.  On exam patient is tachypneic, tachycardic, ill-appearing, severely agitated, diaphoretic.  20 mg of etomidate, 100 mg of rocuronium, intubated with direct MAC for visualization with a 7.5 endotracheal tube.  Postintubation there was appropriate breath sounds     Department of Emergency Medicine CODE BLUE CONSULTATION    Code Blue CONSULT NOTE  Chief Complaint: Increased agitation and increased unresponsiveness  Level V Caveat: Unresponsive  History of present illness: I was contacted by the hospital for a respiratory distress  ROS: Unable to obtain, Level V caveat  Scheduled Meds:  apixaban  5 mg Oral BID   aspirin  81 mg Oral Daily   atorvastatin  80 mg Oral QHS   folic acid  1 mg Oral Daily   haloperidol lactate       HYDROcodone-acetaminophen  1 tablet Oral QID   LORazepam  0-4 mg Intravenous Q6H   Followed by   [name] ON 07/04/2023] LORazepam  0-4 mg Intravenous Q12H   LORazepam  4 mg Intravenous Once   metoprolol tartrate  12.5 mg Oral BID   multivitamin with minerals  1 tablet Oral Daily   pantoprazole  40 mg Oral Daily   thiamine  100 mg Oral Daily   Continuous Infusions:  sodium chloride     amiodarone 30 mg/hr (07/03/23 0005)   dexmedetomidine (PRECEDEX) IV infusion 1.2 mcg/kg/hr (07/02/23 2200)   propofol (DIPRIVAN) infusion 5 mcg/kg/min (07/03/23 0004)   PRN Meds:.acetaminophen **OR** acetaminophen, fentaNYL (SUBLIMAZE) injection, haloperidol lactate, LORazepam **OR** LORazepam, polyethylene glycol Past Medical History:  Diagnosis Date   Aortic stenosis    Status post AVR with 27 mm Edwards INSPIRIS RESILIA pericardial valve January 2024   Bicuspid aortic valve    CAD (coronary artery disease)     Status post LIMA to LAD January 2024   Chronic alcohol abuse    Classic migraine 09/02/2013   CVA (cerebral vascular accident) (HCC)    Diabetes mellitus without complication (HCC)    GERD (gastroesophageal reflux disease)    Gouty arthritis    Hypertension    Major depression    Mixed hyperlipidemia    Peptic ulcer disease    Sleep apnea    Past Surgical History:  Procedure Laterality Date   AORTIC VALVE REPLACEMENT N/A 01/23/2023   Procedure: AORTIC VALVE REPLACEMENT (AVR) USING 27 INSPIRIS VALVE;  Surgeon: Alleen Borne, MD;  Location: [facility] OR;  Service: Open Heart Surgery;  Laterality: N/A;   BIOPSY  07/21/2022   Procedure: BIOPSY;  Surgeon: Dolores Frame, MD;  [facility]: [facility];  Service: Gastroenterology;;   CIRCUMCISION     COLONOSCOPY WITH PROPOFOL N/A 07/21/2022   Procedure: COLONOSCOPY WITH PROPOFOL;  Surgeon: Dolores Frame, MD;  Location: [facility];  Service: Gastroenterology;  Laterality: N/A;  11:15 ASA 2   CORONARY ARTERY BYPASS GRAFT N/A 01/23/2023   Procedure: CORONARY ARTERY BYPASS GRAFTING (CABG) 1 TIMES  USING LEFT INTERNAL MAMMARY ARTERY,  RIGHT SAPHENOUS LEG VEIN HARVESTED ENDOSCOPICALLY;  Surgeon: Alleen Borne, MD;  Location: [facility] OR;  Service: Open Heart Surgery;  Laterality: N/A;   CYST REMOVAL TRUNK  11/24/2012   back   INGUINAL HERNIA REPAIR Bilateral    POLYPECTOMY  07/21/2022   Procedure: POLYPECTOMY;  Surgeon:  Dolores Frame, MD;  Location: [facility];  Service: Gastroenterology;;   RHINOPLASTY     RIGHT HEART CATH AND CORONARY ANGIOGRAPHY N/A 01/22/2023   Procedure: RIGHT HEART CATH AND CORONARY ANGIOGRAPHY;  Surgeon: Runell Gess, MD;  Location: [facility] INVASIVE CV LAB;  Service: Cardiovascular;  Laterality: N/A;   TEE WITHOUT CARDIOVERSION N/A 01/23/2023   Procedure: TRANSESOPHAGEAL ECHOCARDIOGRAM;  Surgeon: Alleen Borne, MD;  Location: [facility] OR;  Service: Open Heart Surgery;  Laterality: N/A;   Social  History   Socioeconomic History   Marital status: Widowed    Spouse name: Not on file   Number of children: 2   Years of education: 9th   Highest education level: Not on file  Occupational History   Occupation: Retired Naval architect  Tobacco Use   Smoking status: Never   Smokeless tobacco: Current    Types: Snuff  Substance and Sexual Activity   Alcohol use: Yes    Alcohol/week: 84.0 standard drinks of alcohol    Types: 84 Cans of beer per week   Drug use: No   Sexual activity: Not on file  Other Topics Concern   Not on file  Social History Narrative   Not on file   Social Determinants of Health   Financial Resource Strain: Not on file  Food Insecurity: No Food Insecurity (07/02/2023)   Hunger Vital Sign    Worried About Running Out of Food in the Last Year: Never true    Ran Out of Food in the Last Year: Never true  Transportation Needs: No Transportation Needs (07/02/2023)   PRAPARE - Administrator, Civil Service (Medical): No    Lack of Transportation (Non-Medical): No  Physical Activity: Not on file  Stress: Not on file  Social Connections: Not on file  Intimate Partner Violence: Not At Risk (07/02/2023)   Humiliation, Afraid, Rape, and Kick questionnaire    Fear of Current or Ex-Partner: No    Emotionally Abused: No    Physically Abused: No    Sexually Abused: No   Allergies  Allergen Reactions   Naproxen Nausea Only   Sulfa Antibiotics Nausea Only    Last set of Vital Signs (not current) Vitals:   07/02/23 2310 07/02/23 2335  BP:    Pulse:    Resp:    Temp:  (!) 96.4 F (35.8 C)  SpO2: 100%       Physical Exam  Gen: Severely agitated, diaphoretic, tachycardic Nondistended abdomen, lungs clear but rapid breathing Neuro: GCS 3, unresponsive to pain  HEENT: No blood in posterior pharynx, gag reflex absent  Neck: No crepitus  Musculoskeletal: No deformity  Skin: warm, diaphoretic  Procedures  INTUBATION Performed by: Vida Roller Required items: required blood products, implants, devices, and special equipment available Patient identity confirmed: provided demographic data and hospital-assigned identification number Time out: Immediately prior to procedure a "time out" was called to verify the correct patient, procedure, equipment, support staff and site/side marked as required. Indications: Severe agitation tachycardia unstable, not responding to sedatives Intubation method: Direct laryngoscopy Preoxygenation: BVM Sedatives: 20 mg of etomidate Paralytic: 100 mg of rocuronium Tube Size: 7.5 cuffed Post-procedure assessment: chest rise and ETCO2 monitor Breath sounds: equal and absent over the epigastrium Tube secured by Respiratory Therapy Patient tolerated the procedure well with no immediate complications.  CRITICAL CARE Performed by: Vida Roller Total critical care time: 10 minutes Critical care time was exclusive of separately billable procedures and treating other  patients. Critical care was necessary to treat or prevent imminent or life-threatening deterioration. Critical care was time spent personally by me on the following activities: development of treatment plan with patient and/or surrogate as well as nursing, discussions with consultants, evaluation of patient's response to treatment, examination of patient, obtaining history from patient or surrogate, ordering and performing treatments and interventions, ordering and review of laboratory studies, ordering and review of radiographic studies, pulse oximetry and re-evaluation of patient's condition.   Medical Decision making  Intubated at request of admitting team - Dr. Mariea Clonts  Assessment and Plan  After intubation the patient's heart rate went up into about the 120 range, oxygenating at 100%, x-ray ordered to confirm tube placement    Eber Hong, MD 07/03/23 0981

## 2023-07-02 NOTE — Assessment & Plan Note (Signed)
Recent CABG 12/2022. Also underwent AV replacement. -Eliquis started -Resume aspirin -Hold Plavix

## 2023-07-02 NOTE — Progress Notes (Signed)
Have been 1:1 with patient since start of shift.  ETOH withdrawal has been severe, with agitation, verbally threatening behavior, pacing, and severe tremors.  MD notified.  New order for precedex started at 1955.  Precedex maxxed out by 2200 with only intermittent effect.  Patient continues to be extremely agitated, requiring soft wrist restraints and continues to be 1:1 at this time.

## 2023-07-02 NOTE — Progress Notes (Signed)
   PCCM transfer request    Sending physician: Dr. Mariea Clonts  Sending facility: [facility]  Reason for transfer: EtOH withdrawal got intubated for agitation   Brief case summary: Presented to ED with palpitations found to be in atrial arrhythmia.  Treated with amiodarone.  Escalating agitation concern for alcohol withdrawal throughout the day.  Unable to be managed with Ativan (albeit relatively sparse dosing) and addition of Precedex.  Eventually was intubated due to agitation.  Recommendations made prior to transfer: -- Chest x-ray to confirm ET tube placement -- Arterial blood gas to make sure safe for transfer, ventilating and oxygenating okay -- Propofol for sedation, stop Precedex once propofol started  Transfer accepted: yes    Karren Burly 07/02/23 11:23 PM [facility]  For contact information, see Amion. If no response to pager, please call PCCM consult pager. After hours, 7PM- 7AM, please call Elink.

## 2023-07-02 NOTE — Assessment & Plan Note (Addendum)
New onset atrial flutter. Heart rates up to 134.  In the setting of daily alcohol abuse and withdrawal.  EKG showing atrial flutter. -Seen by cardiology Dr. Cindra Eves, recommended IV amiodarone, continue Lopressor  and uptitrate as tolerated. Start Eliquis twice daily, continue low-dose aspirin, d/c Plavix, remain on bp meds if blood pressure allows, possible TEE guided cardioversion after 3-4 doses of eliquis. -Please order echo when heart rate is better controlled. - Home metoprolol 12.5mg  BID has been resumed

## 2023-07-02 NOTE — ED Triage Notes (Signed)
Pt c/o of hypertension x2 days. Stated that he just started keeping track of it yesterday and noticed it was elevated with diastolic in the 100s. Also c/o tachycardia. HR 130s in triage.

## 2023-07-02 NOTE — ED Provider Notes (Incomplete)
I was called to the patient's bedside due to respiratory distress and severe agitation, patient had been admitted earlier in the day, hospitalist called me and asked me to come and intubate the patient due to worsening condition and need to control airway.  On exam patient is tachypneic, tachycardic, ill-appearing, severely agitated, diaphoretic.  20 mg of etomidate, 100 mg of rocuronium, intubated with direct MAC for visualization with a 7.5 endotracheal tube.  Postintubation there was appropriate breath sounds

## 2023-07-02 NOTE — Progress Notes (Signed)
ANTICOAGULATION CONSULT NOTE - Initial Consult  Pharmacy Consult for apixaban Indication: atrial fibrillation  Allergies  Allergen Reactions   Naproxen Nausea Only   Sulfa Antibiotics Nausea Only    Patient Measurements: Height: 6' (182.9 cm) Weight: 81.6 kg (180 lb) IBW/kg (Calculated) : 77.6 Heparin Dosing Weight:   Vital Signs: Temp: 97.8 F (36.6 C) (07/08 1418) BP: 112/88 (07/08 1545) Pulse Rate: 124 (07/08 1545)  Labs: Recent Labs    07/02/23 1449  HGB 11.7*  HCT 40.7  PLT 299  CREATININE 0.84  TROPONINIHS 25*    Estimated Creatinine Clearance: 97.5 mL/min (by C-G formula based on SCr of 0.84 mg/dL).   Medical History: Past Medical History:  Diagnosis Date   Aortic stenosis    Status post AVR with 27 mm Edwards INSPIRIS RESILIA pericardial valve January 2024   Bicuspid aortic valve    CAD (coronary artery disease)    Status post LIMA to LAD January 2024   Chronic alcohol abuse    Classic migraine 09/02/2013   CVA (cerebral vascular accident) (HCC)    Diabetes mellitus without complication (HCC)    GERD (gastroesophageal reflux disease)    Gouty arthritis    Hypertension    Major depression    Mixed hyperlipidemia    Peptic ulcer disease    Sleep apnea     Medications:  (Not in a hospital admission)   Assessment: Pharmacy consulted to dose apixaban in patient with atrial fibrillation.  Patient is not on anticoagulation prior to admission.   Goal of Therapy:   Monitor platelets by anticoagulation protocol: Yes   Plan:  Apixaban 5 mg twice daily. Monitor H&H an s/s of bleeding.  Judeth Cornfield, PharmD Clinical Pharmacist 07/02/2023 5:03 PM

## 2023-07-02 NOTE — ED Notes (Signed)
Paged [employer]

## 2023-07-03 ENCOUNTER — Other Ambulatory Visit (HOSPITAL_COMMUNITY): Payer: Self-pay | Admitting: *Deleted

## 2023-07-03 ENCOUNTER — Inpatient Hospital Stay (HOSPITAL_COMMUNITY): Payer: Medicare HMO

## 2023-07-03 DIAGNOSIS — Z4682 Encounter for fitting and adjustment of non-vascular catheter: Secondary | ICD-10-CM | POA: Diagnosis not present

## 2023-07-03 DIAGNOSIS — J9601 Acute respiratory failure with hypoxia: Secondary | ICD-10-CM | POA: Insufficient documentation

## 2023-07-03 DIAGNOSIS — F10939 Alcohol use, unspecified with withdrawal, unspecified: Secondary | ICD-10-CM

## 2023-07-03 DIAGNOSIS — I10 Essential (primary) hypertension: Secondary | ICD-10-CM | POA: Diagnosis not present

## 2023-07-03 DIAGNOSIS — G8929 Other chronic pain: Secondary | ICD-10-CM | POA: Diagnosis not present

## 2023-07-03 DIAGNOSIS — I255 Ischemic cardiomyopathy: Secondary | ICD-10-CM | POA: Diagnosis not present

## 2023-07-03 DIAGNOSIS — F10931 Alcohol use, unspecified with withdrawal delirium: Secondary | ICD-10-CM | POA: Diagnosis not present

## 2023-07-03 DIAGNOSIS — Z952 Presence of prosthetic heart valve: Secondary | ICD-10-CM | POA: Diagnosis not present

## 2023-07-03 DIAGNOSIS — M545 Low back pain, unspecified: Secondary | ICD-10-CM | POA: Diagnosis not present

## 2023-07-03 DIAGNOSIS — I4892 Unspecified atrial flutter: Secondary | ICD-10-CM | POA: Diagnosis not present

## 2023-07-03 DIAGNOSIS — Z951 Presence of aortocoronary bypass graft: Secondary | ICD-10-CM

## 2023-07-03 DIAGNOSIS — I483 Typical atrial flutter: Secondary | ICD-10-CM | POA: Diagnosis not present

## 2023-07-03 LAB — GLUCOSE, CAPILLARY
Glucose-Capillary: 100 mg/dL — ABNORMAL HIGH (ref 70–99)
Glucose-Capillary: 102 mg/dL — ABNORMAL HIGH (ref 70–99)
Glucose-Capillary: 129 mg/dL — ABNORMAL HIGH (ref 70–99)
Glucose-Capillary: 82 mg/dL (ref 70–99)
Glucose-Capillary: 99 mg/dL (ref 70–99)

## 2023-07-03 LAB — BASIC METABOLIC PANEL
Anion gap: 9 (ref 5–15)
BUN: 11 mg/dL (ref 8–23)
CO2: 23 mmol/L (ref 22–32)
Calcium: 8.7 mg/dL — ABNORMAL LOW (ref 8.9–10.3)
Chloride: 104 mmol/L (ref 98–111)
Creatinine, Ser: 0.75 mg/dL (ref 0.61–1.24)
GFR, Estimated: >60 mL/min (ref >60)
Glucose, Bld: 108 mg/dL — ABNORMAL HIGH (ref 70–99)
Potassium: 3.8 mmol/L (ref 3.5–5.1)
Sodium: 136 mmol/L (ref 135–145)

## 2023-07-03 LAB — ECHOCARDIOGRAM LIMITED
Height: 72 in
S' Lateral: 4.6 cm
Weight: 3079.39 oz

## 2023-07-03 LAB — CBC
HCT: 33.3 % — ABNORMAL LOW (ref 39.0–52.0)
Hemoglobin: 9.8 g/dL — ABNORMAL LOW (ref 13.0–17.0)
MCH: 21.9 pg — ABNORMAL LOW (ref 26.0–34.0)
MCHC: 29.4 g/dL — ABNORMAL LOW (ref 30.0–36.0)
MCV: 74.5 fL — ABNORMAL LOW (ref 80.0–100.0)
Platelets: 229 10*3/uL (ref 150–400)
RBC: 4.47 MIL/uL (ref 4.22–5.81)
RDW: 16.1 % — ABNORMAL HIGH (ref 11.5–15.5)
WBC: 6.4 10*3/uL (ref 4.0–10.5)
nRBC: 0 % (ref 0.0–0.2)

## 2023-07-03 LAB — BLOOD GAS, ARTERIAL
Acid-Base Excess: 2.4 mmol/L — ABNORMAL HIGH (ref 0.0–2.0)
Acid-Base Excess: 3.1 mmol/L — ABNORMAL HIGH (ref 0.0–2.0)
Bicarbonate: 25 mmol/L (ref 20.0–28.0)
Bicarbonate: 27 mmol/L (ref 20.0–28.0)
Drawn by: 38235
Drawn by: 38235
FIO2: 30 %
O2 Saturation: 96 %
O2 Saturation: 99.6 %
Patient temperature: 35.8
Patient temperature: 36.1
pCO2 arterial: 30 mmHg — ABNORMAL LOW (ref 32–48)
pCO2 arterial: 37 mmHg (ref 32–48)
pH, Arterial: 7.47 — ABNORMAL HIGH (ref 7.35–7.45)
pH, Arterial: 7.52 — ABNORMAL HIGH (ref 7.35–7.45)
pO2, Arterial: 109 mmHg — ABNORMAL HIGH (ref 83–108)
pO2, Arterial: 158 mmHg — ABNORMAL HIGH (ref 83–108)

## 2023-07-03 LAB — TRIGLYCERIDES: Triglycerides: 120 mg/dL (ref <150)

## 2023-07-03 LAB — VITAMIN B12: Vitamin B-12: 254 pg/mL (ref 180–914)

## 2023-07-03 LAB — MRSA NEXT GEN BY PCR, NASAL: MRSA by PCR Next Gen: NOT DETECTED

## 2023-07-03 MED ORDER — MIDAZOLAM HCL 2 MG/2ML IJ SOLN: 4.0000 mg | INTRAMUSCULAR | Status: DC | PRN

## 2023-07-03 MED ORDER — FAMOTIDINE 20 MG PO TABS
20.0000 mg | ORAL_TABLET | Freq: Two times a day (BID) | ORAL | Status: DC
  Administered 2023-07-03 – 2023-07-06 (×6): 20 mg
  Filled 2023-07-03 (×6): qty 1

## 2023-07-03 MED ORDER — FOLIC ACID 1 MG PO TABS
1.0000 mg | ORAL_TABLET | Freq: Every day | ORAL | Status: DC
  Administered 2023-07-03 – 2023-07-06 (×4): 1 mg
  Filled 2023-07-03 (×4): qty 1

## 2023-07-03 MED ORDER — ACETAMINOPHEN 325 MG PO TABS
650.0000 mg | ORAL_TABLET | Freq: Four times a day (QID) | ORAL | Status: DC | PRN
  Administered 2023-07-04 – 2023-07-05 (×3): 650 mg
  Filled 2023-07-03 (×3): qty 2

## 2023-07-03 MED ORDER — PHENOBARBITAL SODIUM 65 MG/ML IJ SOLN: 32.5000 mg | Freq: Three times a day (TID) | INTRAMUSCULAR | Status: DC

## 2023-07-03 MED ORDER — SODIUM CHLORIDE 0.9 % IV SOLN
260.0000 mg | Freq: Once | INTRAVENOUS | Status: AC
  Administered 2023-07-03: 260 mg via INTRAVENOUS
  Filled 2023-07-03: qty 2

## 2023-07-03 MED ORDER — PHENOBARBITAL SODIUM 65 MG/ML IJ SOLN: 65.0000 mg | Freq: Three times a day (TID) | INTRAMUSCULAR | Status: DC

## 2023-07-03 MED ORDER — ASPIRIN 81 MG PO CHEW
81.0000 mg | CHEWABLE_TABLET | Freq: Every day | ORAL | Status: DC
  Administered 2023-07-03 – 2023-07-06 (×4): 81 mg
  Filled 2023-07-03 (×4): qty 1

## 2023-07-03 MED ORDER — LORAZEPAM 2 MG/ML IJ SOLN: 1.0000 mg | INTRAMUSCULAR | Status: DC | PRN

## 2023-07-03 MED ORDER — PHENOBARBITAL SODIUM 130 MG/ML IJ SOLN
97.5000 mg | Freq: Three times a day (TID) | INTRAMUSCULAR | Status: DC
  Administered 2023-07-04: 97.5 mg via INTRAVENOUS
  Filled 2023-07-03: qty 1

## 2023-07-03 MED ORDER — PANTOPRAZOLE SODIUM 40 MG IV SOLR
40.0000 mg | Freq: Every day | INTRAVENOUS | Status: DC
  Administered 2023-07-04 – 2023-07-06 (×3): 40 mg via INTRAVENOUS
  Filled 2023-07-03 (×3): qty 10

## 2023-07-03 MED ORDER — METOPROLOL TARTRATE 5 MG/5ML IV SOLN: 5.0000 mg | Freq: Three times a day (TID) | INTRAVENOUS | Status: DC | PRN

## 2023-07-03 MED ORDER — ORAL CARE MOUTH RINSE
15.0000 mL | OROMUCOSAL | Status: DC
  Administered 2023-07-03 (×4): 15 mL via OROMUCOSAL

## 2023-07-03 MED ORDER — LORAZEPAM 1 MG PO TABS: 1.0000 mg | ORAL_TABLET | ORAL | Status: DC | PRN

## 2023-07-03 MED ORDER — PERFLUTREN LIPID MICROSPHERE
1.0000 mL | INTRAVENOUS | Status: AC | PRN
  Administered 2023-07-03: 6 mL via INTRAVENOUS

## 2023-07-03 MED ORDER — APIXABAN 5 MG PO TABS
5.0000 mg | ORAL_TABLET | Freq: Two times a day (BID) | ORAL | Status: DC
  Administered 2023-07-03 – 2023-07-06 (×7): 5 mg
  Filled 2023-07-03 (×8): qty 1

## 2023-07-03 MED ORDER — ACETAMINOPHEN 650 MG RE SUPP: 650.0000 mg | Freq: Four times a day (QID) | RECTAL | Status: DC | PRN

## 2023-07-03 MED ORDER — POLYETHYLENE GLYCOL 3350 17 G PO PACK: 17.0000 g | PACK | Freq: Every day | ORAL | Status: DC | PRN

## 2023-07-03 MED ORDER — CHLORHEXIDINE GLUCONATE CLOTH 2 % EX PADS
6.0000 | MEDICATED_PAD | Freq: Every day | CUTANEOUS | Status: DC
  Administered 2023-07-03 – 2023-07-10 (×8): 6 via TOPICAL

## 2023-07-03 MED ORDER — PANTOPRAZOLE SODIUM 40 MG IV SOLR
40.0000 mg | INTRAVENOUS | Status: DC
  Administered 2023-07-03: 40 mg via INTRAVENOUS
  Filled 2023-07-03: qty 10

## 2023-07-03 MED ORDER — LORAZEPAM 2 MG/ML IJ SOLN
1.0000 mg | INTRAMUSCULAR | Status: DC | PRN
  Administered 2023-07-05 (×2): 1 mg via INTRAVENOUS
  Filled 2023-07-03 (×3): qty 1

## 2023-07-03 MED ORDER — ATORVASTATIN CALCIUM 80 MG PO TABS
80.0000 mg | ORAL_TABLET | Freq: Every day | ORAL | Status: DC
  Administered 2023-07-03 – 2023-07-05 (×3): 80 mg
  Filled 2023-07-03 (×4): qty 1

## 2023-07-03 MED ORDER — ORAL CARE MOUTH RINSE: 15.0000 mL | OROMUCOSAL | Status: DC | PRN

## 2023-07-03 MED ORDER — HYDROCODONE-ACETAMINOPHEN 5-325 MG PO TABS
1.0000 | ORAL_TABLET | Freq: Four times a day (QID) | ORAL | Status: DC
  Administered 2023-07-03 – 2023-07-06 (×9): 1
  Filled 2023-07-03 (×9): qty 1

## 2023-07-03 MED ORDER — THIAMINE MONONITRATE 100 MG PO TABS
100.0000 mg | ORAL_TABLET | Freq: Every day | ORAL | Status: DC
  Administered 2023-07-03 – 2023-07-05 (×3): 100 mg
  Filled 2023-07-03 (×3): qty 1

## 2023-07-03 MED ORDER — MIDAZOLAM HCL 2 MG/2ML IJ SOLN
1.0000 mg | INTRAMUSCULAR | Status: DC | PRN
  Administered 2023-07-04 – 2023-07-06 (×10): 2 mg via INTRAVENOUS
  Filled 2023-07-03 (×11): qty 2

## 2023-07-03 MED ORDER — ADULT MULTIVITAMIN W/MINERALS CH
1.0000 | ORAL_TABLET | Freq: Every day | ORAL | Status: DC
  Administered 2023-07-03 – 2023-07-06 (×4): 1
  Filled 2023-07-03 (×4): qty 1

## 2023-07-03 MED ORDER — INSULIN ASPART 100 UNIT/ML IJ SOLN: 0.0000 [IU] | INTRAMUSCULAR | Status: DC

## 2023-07-03 MED ORDER — METOPROLOL TARTRATE 12.5 MG HALF TABLET
12.5000 mg | ORAL_TABLET | Freq: Two times a day (BID) | ORAL | Status: DC
  Administered 2023-07-03: 12.5 mg
  Filled 2023-07-03: qty 1

## 2023-07-03 NOTE — H&P (Signed)
NAME:  Raymond Roberson, MRN:  [id number], DOB:  1956-08-10, LOS: 1 ADMISSION DATE:  07/02/2023, CONSULTATION DATE: 07/03/23 REFERRING MD:  EDP, CHIEF COMPLAINT:  ETOH withdrawal   History of Present Illness:  Raymond Roberson is a 67 y.o. M with PMH significant for CAD s/p CABG 12/2022, Ischemic cardiomyopathy, HTN, HFrEF and ETOH abuse with reported 12 beers/day who presented to [facility] ED with two days of dizziness and shortness of breath.  He stopped drinking thinking that this would help (evening of 7/8), and then developed tremulousness and diaphoresis.   In the ED he was found to be in Afib/flutter and was treated with amiodarone.  He became increasingly agitated and required precedex and eventual intubation so was transferred to [facility].  Labs notable for flat troponin 25>25, ethanol <10, ABG 7.52/30/158/25   Pertinent  Medical History   has a past medical history of Aortic stenosis, Bicuspid aortic valve, CAD (coronary artery disease), Chronic alcohol abuse, Classic migraine (09/02/2013), CVA (cerebral vascular accident) (HCC), Diabetes mellitus without complication (HCC), GERD (gastroesophageal reflux disease), Gouty arthritis, Hypertension, Major depression, Mixed hyperlipidemia, Peptic ulcer disease, and Sleep apnea.   Significant Hospital Events: Including procedures, antibiotic start and stop dates in addition to other pertinent events   7/8 presented to [facility] in aflutter and developed worsening ETOH withdrawal requiring intubation 7/9 arrived to [facility] stable on propofol and CIWA  Interim History / Subjective:  Pt is hypertensive and tachycardic on propofol and amiodarone  Objective   Blood pressure 119/68, pulse 65, temperature 98.1 F (36.7 C), temperature source Axillary, resp. rate 15, height 6' (1.829 m), weight 87.3 kg, SpO2 100 %.    Vent Mode: PRVC FiO2 (%):  [30 %-50 %] 30 % Set Rate:  [14 bmp-18 bmp] 14 bmp Vt Set:  [620 mL] 620 mL PEEP:  [5 cmH20] 5 cmH20 Plateau Pressure:   [13 cmH20-16 cmH20] 14 cmH20   Intake/Output Summary (Last 24 hours) at 07/03/2023 1903 Last data filed at 07/03/2023 1759 Gross per 24 hour  Intake 1395.27 ml  Output 2050 ml  Net -654.73 ml   Filed Weights   07/02/23 1413 07/02/23 1839 07/03/23 0505  Weight: 81.6 kg 86.9 kg 87.3 kg    General:  well nourished elderly M, critically ill intubated and sedated  HEENT: MM pink/moist, sclera anicteric Neuro: examined on propofol, + gag, PERRLA, RASS -4 CV: s1s2 tachycardic, irregular, no m/r/g PULM:  clear bilaterally on PRVC with minimal vent settings GI: soft, non-distended  Extremities: warm/dry, no edema  Skin: no rashes or lesions   Resolved Hospital Problem list     Assessment & Plan:   Acute ETOH withdrawal and DT's with subsequent acute hypoxic respiratory failure Failed precedex in the ED and required intubation Last drink around 10pm 7/8 CXR without acute abnormality -monitor in ICU on vent tonight, continue propofol, add phenobarbital and prn versed -continue folic acid, thiamine and MV --Maintain full vent support with SAT/SBT as tolerated -titrate Vent setting to maintain SpO2 greater than or equal to 90%. -HOB elevated 30 degrees. -Plateau pressures less than 30 cm H20.  -Follow chest x-ray, ABG prn.   -Bronchial hygiene and RT/bronchodilator protocol.   New onset atrial fib/flutter  -continue amiodarone and Eliquis -echo completed with read pending -prn metoprolol    History of CAD w/CABG, ischemic cardiomyopathy POA -troponin flat, echo pending -continue Asa, Eliquis and statin      Best Practice (right click and "Reselect all SmartList Selections" daily)   Diet/type: NPO  DVT prophylaxis: DOAC GI prophylaxis: PPI Lines: N/A Foley:  Yes, and it is still needed Code Status:  full code Last date of multidisciplinary goals of care discussion [pending]  Labs   CBC: Recent Labs  Lab 07/02/23 1449 07/03/23 0455  WBC 8.1 6.4  NEUTROABS 6.9   --   HGB 11.7* 9.8*  HCT 40.7 33.3*  MCV 75.5* 74.5*  PLT 299 229    Basic Metabolic Panel: Recent Labs  Lab 07/02/23 1449 07/03/23 0455  NA 134* 136  K 3.8 3.8  CL 99 104  CO2 26 23  GLUCOSE 107* 108*  BUN 13 11  CREATININE 0.84 0.75  CALCIUM 9.3 8.7*  MG 2.1  --    GFR: Estimated Creatinine Clearance: 102.4 mL/min (by C-G formula based on SCr of 0.75 mg/dL). Recent Labs  Lab 07/02/23 1449 07/03/23 0455  WBC 8.1 6.4    Liver Function Tests: Recent Labs  Lab 07/02/23 1449  AST 28  ALT 30  ALKPHOS 62  BILITOT 1.3*  PROT 8.3*  ALBUMIN 4.0   Recent Labs  Lab 07/02/23 1449  LIPASE 47   No results for input(s): "AMMONIA" in the last 168 hours.  ABG    Component Value Date/Time   PHART 7.47 (H) 07/03/2023 0501   PCO2ART 37 07/03/2023 0501   PO2ART 109 (H) 07/03/2023 0501   HCO3 27.0 07/03/2023 0501   TCO2 25 01/23/2023 2024   ACIDBASEDEF 1.0 01/23/2023 2024   O2SAT 96 07/03/2023 0501     Coagulation Profile: No results for input(s): "INR", "PROTIME" in the last 168 hours.  Cardiac Enzymes: No results for input(s): "CKTOTAL", "CKMB", "CKMBINDEX", "TROPONINI" in the last 168 hours.  HbA1C: Hgb A1c MFr Bld  Date/Time Value Ref Range Status  01/19/2023 08:11 PM 5.4 4.8 - 5.6 % Final    Comment:    (NOTE) Pre diabetes:          5.7%-6.4%  Diabetes:              >6.4%  Glycemic control for   <7.0% adults with diabetes     CBG: Recent Labs  Lab 07/03/23 0054 07/03/23 0743 07/03/23 1120 07/03/23 1524  GLUCAP 129* 100* 82 99    Review of Systems:   Unable to obtain  Past Medical History:  He,  has a past medical history of Aortic stenosis, Bicuspid aortic valve, CAD (coronary artery disease), Chronic alcohol abuse, Classic migraine (09/02/2013), CVA (cerebral vascular accident) (HCC), Diabetes mellitus without complication (HCC), GERD (gastroesophageal reflux disease), Gouty arthritis, Hypertension, Major depression, Mixed  hyperlipidemia, Peptic ulcer disease, and Sleep apnea.   Surgical History:   Past Surgical History:  Procedure Laterality Date   AORTIC VALVE REPLACEMENT N/A 01/23/2023   Procedure: AORTIC VALVE REPLACEMENT (AVR) USING 27 INSPIRIS VALVE;  Surgeon: Alleen Borne, MD;  Location: [facility] OR;  Service: Open Heart Surgery;  Laterality: N/A;   BIOPSY  07/21/2022   Procedure: BIOPSY;  Surgeon: Dolores Frame, MD;  Location: [facility] ENDO SUITE;  Service: Gastroenterology;;   CIRCUMCISION     COLONOSCOPY WITH PROPOFOL N/A 07/21/2022   Procedure: COLONOSCOPY WITH PROPOFOL;  Surgeon: Dolores Frame, MD;  Location: [facility] END[facility];  Service: Gastroenterology;  Laterality: N/A;  11:15 ASA 2   CORONARY ARTERY BYPASS GRAFT N/A 01/23/2023   Procedure: CORONARY ARTERY BYPASS GRAFTING (CABG) 1 TIMES  USING LEFT INTERNAL MAMMARY ARTERY,  RIGHT SAPHENOUS LEG VEIN HARVESTED ENDOSCOPICALLY;  Surgeon: Alleen Borne, MD;  Location: [facility] OR;  Service: Open Heart Surgery;  Laterality: N/A;   CYST REMOVAL TRUNK  11/24/2012   back   INGUINAL HERNIA REPAIR Bilateral    POLYPECTOMY  07/21/2022   Procedure: POLYPECTOMY;  Surgeon: Dolores Frame, MD;  Location: [facility];  Service: Gastroenterology;;   RHINOPLASTY     RIGHT HEART CATH AND CORONARY ANGIOGRAPHY N/A 01/22/2023   Procedure: RIGHT HEART CATH AND CORONARY ANGIOGRAPHY;  Surgeon: Runell Gess, MD;  Location: [facility] INVASIVE CV LAB;  Service: Cardiovascular;  Laterality: N/A;   TEE WITHOUT CARDIOVERSION N/A 01/23/2023   Procedure: TRANSESOPHAGEAL ECHOCARDIOGRAM;  Surgeon: Alleen Borne, MD;  Location: [facility] OR;  Service: Open Heart Surgery;  Laterality: N/A;     Social History:   reports that he has never smoked. His smokeless tobacco use includes snuff. He reports current alcohol use of about 84.0 standard drinks of alcohol per week. He reports that he does not use drugs.   Family History:  His family history includes Allergies in his  mother; Anxiety disorder in his mother; CAD in his father; Heart failure in his father.   Allergies Allergies  Allergen Reactions   Naproxen Nausea Only   Sulfa Antibiotics Nausea Only     Home Medications  Prior to Admission medications   Medication Sig Start Date End Date Taking? Authorizing Provider  ALPRAZolam Prudy Feeler) 0.5 MG tablet Take 0.5 mg by mouth 2 (two) times daily.    [provider]  aspirin 81 MG tablet Take 81 mg by mouth daily.    [provider]  atorvastatin (LIPITOR) 80 MG tablet Take 1 tablet (80 mg total) by mouth daily. 05/31/23   Mallipeddi, Vishnu P, MD  citalopram (CELEXA) 40 MG tablet Take 20 mg by mouth daily.    [provider]  clopidogrel (PLAVIX) 75 MG tablet Take 1 tablet (75 mg total) by mouth daily. 04/10/23   Mallipeddi, Vishnu P, MD  esomeprazole (NEXIUM) 40 MG capsule Take 40 mg by mouth daily. 04/27/22   [provider]  HYDROcodone-acetaminophen (NORCO/VICODIN) 5-325 MG tablet Take 1 tablet by mouth 4 (four) times daily.    [provider]  hydrocortisone (ANUSOL-HC) 2.5 % rectal cream Place 1 application  rectally daily as needed for hemorrhoids or anal itching.    [provider]  levocetirizine (XYZAL) 5 MG tablet Take 5 mg by mouth every evening.    [provider]  losartan (COZAAR) 25 MG tablet Take 25 mg by mouth daily. 03/29/23   [provider]  metoprolol tartrate (LOPRESSOR) 25 MG tablet Take 0.5 tablets (12.5 mg total) by mouth 2 (two) times daily. 04/10/23   Mallipeddi, Vishnu P, MD  Multiple Vitamins-Minerals (MULTIVITAMIN WITH MINERALS) tablet Take 1 tablet by mouth daily. Patient not taking: Reported on 04/10/2023    [provider]  ondansetron (ZOFRAN) 8 MG tablet Take 8 mg by mouth once a week. 04/10/22   [provider]  sildenafil (VIAGRA) 100 MG tablet Take 100 mg by mouth daily as needed for erectile dysfunction. 01/17/23   [provider]   spironolactone (ALDACTONE) 25 MG tablet TAKE 1 TABLET ONCE DAILY 05/31/23   Mallipeddi, Vishnu P, MD     Critical care time:  35 minutes      CRITICAL CARE Performed by: Darcella Gasman Viridiana Spaid   Total critical care time: 35 minutes  Critical care time was exclusive of separately billable procedures and treating other patients.  Critical care was necessary to treat or prevent imminent or life-threatening  deterioration.  Critical care was time spent personally by me on the following activities: development of treatment plan with patient and/or surrogate as well as nursing, discussions with consultants, evaluation of patient's response to treatment, examination of patient, obtaining history from patient or surrogate, ordering and performing treatments and interventions, ordering and review of laboratory studies, ordering and review of radiographic studies, pulse oximetry and re-evaluation of patient's condition.  Darcella Gasman Shalinda Burkholder, PA-C [facility] See Amion for pager If no response to pager , please call [phone] until 7pm After 7:00 pm call Elink  [phone]

## 2023-07-03 NOTE — Progress Notes (Signed)
Report was given to Dorene Grebe, Charity fundraiser on 2 H at [employer].

## 2023-07-03 NOTE — Progress Notes (Addendum)
Rounding Note    Patient Name: Raymond Roberson Date of Encounter: 07/03/2023  Raymond Roberson Cardiologist: Marjo Bicker, MD   Subjective   Currently intubated and sedated. Awaiting transfer to [facility] by review of notes.   Inpatient Medications    Scheduled Meds:  apixaban  5 mg Oral BID   aspirin  81 mg Oral Daily   atorvastatin  80 mg Oral QHS   Chlorhexidine Gluconate Cloth  6 each Topical Daily   folic acid  1 mg Oral Daily   HYDROcodone-acetaminophen  1 tablet Oral QID   LORazepam  0-4 mg Intravenous Q6H   Followed by   [name] ON 07/04/2023] LORazepam  0-4 mg Intravenous Q12H   LORazepam  4 mg Intravenous Once   metoprolol tartrate  12.5 mg Oral BID   multivitamin with minerals  1 tablet Oral Daily   pantoprazole  40 mg Oral Daily   thiamine  100 mg Oral Daily   Continuous Infusions:  amiodarone 30 mg/hr (07/03/23 0443)   propofol (DIPRIVAN) infusion 60 mcg/kg/min (07/03/23 0629)   PRN Meds: acetaminophen **OR** acetaminophen, fentaNYL (SUBLIMAZE) injection, LORazepam **OR** LORazepam, midazolam, polyethylene glycol   Vital Signs    Vitals:   07/03/23 0730 07/03/23 0732 07/03/23 0745 07/03/23 0749  BP: 115/77  108/66   Pulse: 63 64 63 63  Resp: 14  14 14   Temp:    98.1 F (36.7 C)  TempSrc:    Axillary  SpO2: 100%  100% 100%  Weight:      Height:        Intake/Output Summary (Last 24 hours) at 07/03/2023 0754 Last data filed at 07/03/2023 0533 Gross per 24 hour  Intake 610.35 ml  Output 700 ml  Net -89.65 ml      07/03/2023    5:05 AM 07/02/2023    6:39 PM 07/02/2023    2:13 PM  Last 3 Weights  Weight (lbs) 192 lb 7.4 oz 191 lb 8 oz 180 lb  Weight (kg) 87.3 kg 86.864 kg 81.647 kg      Telemetry    Atrial flutter, HR in 60's to 70's.  - Personally Reviewed  ECG    Atrial flutter with RVR, HR 125 with LVH and TWI along lateral leads.   - Personally Reviewed  Physical Exam   GEN: Ill-appearing male, currently in no acute  distress.   Neck: No JVD Cardiac: Irregularly irregular, 2/6 systolic murmur along RUSB.  Respiratory: Clear to auscultation bilaterally. Intubated.  GI: Soft, nontender, non-distended  MS: Trace ankle edema bilaterally; No deformity. Neuro: Currently sedated.  Psych: Unable to be assessed. Intubated and sedated.   Labs    High Sensitivity Troponin:   Recent Labs  Lab 07/02/23 1449 07/02/23 1629  TROPONINIHS 25* 25*     Chemistry Recent Labs  Lab 07/02/23 1449 07/03/23 0455  NA 134* 136  K 3.8 3.8  CL 99 104  CO2 26 23  GLUCOSE 107* 108*  BUN 13 11  CREATININE 0.84 0.75  CALCIUM 9.3 8.7*  MG 2.1  --   PROT 8.3*  --   ALBUMIN 4.0  --   AST 28  --   ALT 30  --   ALKPHOS 62  --   BILITOT 1.3*  --   GFRNONAA >60 >60  ANIONGAP 9 9    Lipids  Recent Labs  Lab 07/03/23 0455  TRIG 120    Hematology Recent Labs  Lab 07/02/23 1449 07/03/23 0455  WBC 8.1 6.4  RBC 5.39 4.47  HGB 11.7* 9.8*  HCT 40.7 33.3*  MCV 75.5* 74.5*  MCH 21.7* 21.9*  MCHC 28.7* 29.4*  RDW 16.3* 16.1*  PLT 299 229   Thyroid No results for input(s): "TSH", "FREET4" in the last 168 hours.  BNPNo results for input(s): "BNP", "PROBNP" in the last 168 hours.  DDimer No results for input(s): "DDIMER" in the last 168 hours.   Radiology    DG Chest Portable 1 View  Result Date: 07/02/2023 CLINICAL DATA:  Intubation and orogastric tube. EXAM: PORTABLE CHEST 1 VIEW COMPARISON:  Chest x-ray 07/02/2023 FINDINGS: Endotracheal tube tip is 4.4 cm above the carina. Enteric tube extends below the diaphragm into the stomach. The distal tip is not included on the image. The heart is enlarged. Patient is status post cardiac valve replacement. There is no focal lung infiltrate, pleural effusion, or pneumothorax. IMPRESSION: Endotracheal tube tip is 4.4 cm above the carina. Enteric tube extends below the diaphragm into the stomach. Electronically Signed   By: Darliss Cheney M.D.   On: 07/02/2023 23:41   DG  Chest Port 1 View  Result Date: 07/02/2023 CLINICAL DATA:  Hypertension EXAM: PORTABLE CHEST 1 VIEW COMPARISON:  03/07/2023 FINDINGS: Stable cardiomegaly status post sternotomy and aortic valve replacement. Minimal left basilar scarring or atelectasis. Lungs are otherwise clear. No pleural effusion or pneumothorax. IMPRESSION: Minimal left basilar scarring or atelectasis. No acute cardiopulmonary findings. Electronically Signed   By: Duanne Guess D.O.   On: 07/02/2023 15:16    Cardiac Studies   Echocardiogram: 02/2023 IMPRESSIONS     1. Left ventricular ejection fraction, by estimation, is 40 to 45%. The  left ventricle has mildly decreased function. The left ventricle  demonstrates regional wall motion abnormalities (see scoring  diagram/findings for description). There is mild left  ventricular hypertrophy. Left ventricular diastolic parameters are  consistent with Grade I diastolic dysfunction (impaired relaxation).   2. Right ventricular systolic function is normal. The right ventricular  size is normal.   3. The mitral valve is normal in structure. Trivial mitral valve  regurgitation. No evidence of mitral stenosis.   4. The aortic valve has been repaired/replaced. Aortic valve  regurgitation is not visualized. No aortic stenosis is present. There is a  bioprosthetic valve present in the aortic position.   5. The inferior vena cava is normal in size with greater than 50%  respiratory variability, suggesting right atrial pressure of 3 mmHg.   Patient Profile     67 y.o. male w/ PMH of CAD (s/p CABG with LIMA-LAD in 12/2022 with known CTO of RCA with collaterals, severe AS (s/p SAVR in 12/2022), HTN, HLD and alcohol abuse who is currently admitted for alcohol withdrawal. Cardiology consulted due to newly diagnosed atrial flutter with RVR.   Assessment & Plan    1. Atrial Flutter with RVR - New diagnosis for the patient this admission but occurring in the setting of alcohol  withdrawal as he was consuming 12+ beers daily by review of notes.  - Was started on IV Amiodarone yesterday and rates have improved, now into the 60's to 70's. Would continue with IV Amiodarone today. Also being started on Eliquis 5mg  BID (per tube) and continued on Lopressor 12.5mg  BID. Pending his clinical course, could consider TEE-guided DCCV later this admission. At this time, at very-high risk for recurrence given his alcohol withdrawal.   2. CAD - He underwent CABG x1 in 12/2022 with LIMA-LAD and has a known CTO  of the RCA. Hs Troponin flat at 25 this admission. Repeat echo pending. Remains on ASA, Atorvastatin and Lopressor. Plavix was stopped yesterday given the indication for anticoagulation.   3. Severe AS - He is s/p AVR in 12/2022 with 27 mm Edwards Inspiris Resilia. Echo in 02/2023 showed no significant stenosis or regurgitation.   4. HFmrEF - His EF was at 30-35% in 12/2022, improved to 40-45% by repeat echo in 02/2023. Not currently volume overloaded. He has been continued on Lopressor 12.5mg  BID but PTA Losartan and Spironolactone currently held given soft BP. Would restart GDMT as BP allows. Will plan for a repeat limited echo now that HR has improved.   5. Alcohol Withdrawal - Initially was started on Ativan and Precedex and ultimately required intubation due to worsening agitation and unresponsiveness. Currently on Propofol. Awaiting transfer to the Critical Care service at [facility].   6. Anemia - Baseline Hgb 10 - 11. At 11.7 on admission and at 9.8 today. Further workup per the admitting team.   For questions or updates, please contact [place] Roberson Please consult [email or web address] for contact info under        Signed, Ellsworth Lennox, PA-C  07/03/2023, 7:54 AM    Patient examined chart reviewed Discussed care with daughter. Intubated SEM through AVR well controlled flutter on telemetry Prior sternotmy Flutter well rate controlled when intubated and sedated  for ETOH withdrawal. If eliquis cannot be given by tube would start heparin Has only had one dose last night Would need 3 doses before considering TEE/DCC Transfer to [facility] anticipated Cardiology service can follow there   Charlton Haws MD [facility]

## 2023-07-03 NOTE — Progress Notes (Signed)
Initial Nutrition Assessment  DOCUMENTATION CODES:      INTERVENTION:  If patient expected to remain intubated >24-48 hr recommend: Initiate continuous Vital High Protein @ 40 ml/hr per OGT tube  Add 60 ml ProSource TF 20 -BID per tube.    Tube feeding regimen provides 1120 kcal, 123 grams of protein, and 802 ml of H2O.    Sedation currently providing - 826 kcal daily  NUTRITION DIAGNOSIS:   Inadequate oral intake related to inability to eat as evidenced by NPO status.   GOAL:  Provide needs based on ASPEN/SCCM guidelines   MONITOR:   Vent status, TF tolerance, Weight trends, Labs  REASON FOR ASSESSMENT:   Ventilator    ASSESSMENT: Patient is an overweight 67 yo male with hx of alcohol abuse, GERD, PUD,DM, CVA, CAD.   Patient intubated 7/8 @ 2319 on ventilator support due to respiratory distress, severe agitation. No family bedside. Per chart patient may transfer to [facility].   MV: 8.3  L/min Temp (24hrs), Avg:97.7 F (36.5 C), Min:96.4 F (35.8 C), Max:98.5 F (36.9 C)  Propofol: 31.3 ml/hr ml/hr (provides 826 kcal lipids) every 24 hr at current rate   Medications reviewed and include: folic acid, thiamine, MVI, pantoprazole.  Wt Readings from Last 10 Encounters:  07/03/23 87.3 kg  04/10/23 83.8 kg  03/07/23 79.4 kg  01/27/23 77.7 kg  07/19/22 99.8 kg  09/02/13 100 kg  09/01/13 99.3 kg    Intake/Output Summary (Last 24 hours) at 07/03/2023 1115 Last data filed at 07/03/2023 1055 Gross per 24 hour  Intake 1035.91 ml  Output 1625 ml  Net -589.09 ml        Latest Ref Rng & Units 07/03/2023    4:55 AM 07/02/2023    2:49 PM 01/27/2023    6:47 AM  BMP  Glucose 70 - 99 mg/dL 161  096  81   BUN 8 - 23 mg/dL 11  13  13    Creatinine 0.61 - 1.24 mg/dL 0.45  4.09  8.11   Sodium 135 - 145 mmol/L 136  134  135   Potassium 3.5 - 5.1 mmol/L 3.8  3.8  4.1   Chloride 98 - 111 mmol/L 104  99  98   CO2 22 - 32 mmol/L 23  26  26    Calcium 8.9 - 10.3 mg/dL 8.7  9.3  9.1        Diet Order:   Diet Order             Diet NPO time specified  Diet effective midnight                   EDUCATION NEEDS:  Not appropriate for education at this time  Skin:  Skin Assessment: Reviewed RN Assessment  Last BM:  7/8  Height:   Ht Readings from Last 1 Encounters:  07/02/23 6' (1.829 m)    Weight:   Wt Readings from Last 1 Encounters:  07/03/23 87.3 kg    Ideal Body Weight:   81 kg  BMI:  Body mass index is 26.1 kg/m.  Estimated Nutritional Needs:   Kcal:  2200-2400  Protein:  115-122 gr  Fluid:  > 2 liters daily  Royann Shivers MS,RD,CSG,LDN Contact: Loretha Stapler

## 2023-07-03 NOTE — Progress Notes (Signed)
Progress Note   Patient: Raymond Roberson ZOX:[id number] DOB: [birth year] DOA: 07/02/2023     1 DOS: the patient was seen and examined on 07/03/2023   Brief hospital admission narrative : As per H&P written by Dr. Mariea Clonts on 07/02/2023 Raymond Roberson is a 67 y.o. male with medical history significant for CABG, Ischemic cardiomyopathy, HTN, systolic CHF.  Patient presented to the ED with complaints of elevated blood pressure and heart rate for the past few weeks.  Patient had CABG January 2024, reports has had barely any chest pain since then.  Reports dizziness and difficulty breathing today when standing.  Also reports onset of diaphoresis and tremulousness today. He also reports intermittent numbness and tingling to his bilateral upper and lower extremities since his surgery., no neck pain but he has chronic back pain that is unchanged.  No weakness of his extremities, no facial assymmetry and no speech changes.   ED Course: Heart rate 109-139, respiratory rate 21 - 27, blood pressure systolic 112- 157.  O2 sats greater than 98% on room. Troponin 25 x 2.:  Unremarkable CBC, CMP. Chest x-ray shows mild scarring. EKG- Atria Fib/flutter, rate 125. Cardiology consulted, recommends starting Eliquis, Amidoarone drip, ECHO when rates better controlled, continue Aspirin, hold plavix, add metop if BP will allow, possible cardioversion planned.  Assessment and Plan: * Alcohol withdrawal (HCC) -Presenting with tachycardia, tremulousness, initial diaphoresis.  Alcoholic beverage was last night at about 10 PM. At baseline drinks about 12 beers per day.   -Patient with prior history of withdrawal symptoms -Continue thiamine, folic acid and follow CIWA protocol -Patient intubated for airway protection -Precedex discontinued by PCCM; continue sedation with propofol.  Typical atrial flutter (HCC) New onset atrial flutter. Heart rates up to 134.  In the setting of daily alcohol abuse and withdrawal.   . -Appreciate assistance and recommendation by cardiology service -Continue treatment with metoprolol, amiodarone and Eliquis -Okay to continue low-dose aspirin and hold Plavix at the moment. -Anticipated cardioversion if patient failed to auto converted on his own -Continue electrolyte repletion with intention/goal for potassium above 4 and magnesium above 2 -Continue supportive care and adequate hydration.    Alcohol withdrawal with delirium (HCC) -Continue CIWA protocol and supportive care with constant reorientation.  Chronic back pain -Resume home narcotics. Reports intermittent bilateral upper and lower extremity numbness and tingling. No neck pain, no other neurologic deficits. Hx of alcohol abuse and DM, not on medications. Possible peripheral neuropathy- likely 2/2 alcohol/DM. -Will check B12 level.  S/P CABG x 1 -Recent CABG 12/2022. Also underwent AV replacement. -Patient has been started on Eliquis low-dose aspirin can hold off on Plavix -Will also continue metoprolol   Ischemic cardiomyopathy -Echo 02/2023, EF of 40 to 45%, grade 1 DD.  Bioprosthetic aortic valve. Not on diuretics. -Continue to follow daily weights and history does not know -Appears to be currently compensated -Follow clinical response and further recommendations by cardiology.  S/P AVR -No significant regurgitation or stenosis from echo in March 2024 -Continue to follow recommendations and further treatment by cardiology service.  HTN (hypertension) -Blood pressure stable at this time.   -Continue current antihypertensive agents.   Subjective:  Intubated, sedated and mechanically ventilated; no frank crackles appreciated on exam.  Heart rate demonstrating good control with the use of amiodarone and metoprolol.  Physical Exam: Vitals:   07/03/23 0900 07/03/23 0915 07/03/23 0930 07/03/23 0949  BP: 107/67 105/67 100/62 111/68  Pulse: 63 63 63 63  Resp: 14 14 14  14  Temp:      TempSrc:       SpO2: 100% 100% 100% 100%  Weight:      Height:       General exam: Intubated, sedated and mechanically ventilated;: Following commands. Respiratory system: Decreased breath sounds at the bases; no frank crackles or wheezing on exam.  Positive rhonchi appreciated. Cardiovascular system:Rate controlled. No rubs or gallops. Gastrointestinal system: Abdomen is nondistended, soft and nontender. No organomegaly or masses felt. Normal bowel sounds heard. Central nervous system:  No focal neurological deficits.  Limited examination with current sedation. Extremities: No cyanosis or clubbing. Skin: No petechiae. Psychiatry: Judgement and insight unable to be properly examined with current sedation.  Data Reviewed: ABG: pH 7.52, CO2 30, pO2 158, bicarb 25, oxygen saturation 99.6. Basic metabolic panel: Sodium 136, potassium 3.8, chloride 104, bicarb 23, BUN 11, creatinine 0.75 and GFR >60 CBC: White blood cell 6.4, hemoglobin 9.8 and platelet count 229 K Glycerides: 120  Family Communication: Daughter at bedside  Disposition: Status is: Inpatient Remains inpatient appropriate because: Continue medical management for acute respiratory distress ventilator dependent and A-fib with RVR; patient with underlying history of alcohol abuse with withdrawal.   Planned Discharge Destination: Home  Time spent: 50 minutes  Author: Vassie Loll, MD 07/03/2023 10:45 AM  For on call review [email or web address].

## 2023-07-03 NOTE — Assessment & Plan Note (Signed)
-  Continue CIWA protocol and supportive care with constant reorientation.

## 2023-07-03 NOTE — Progress Notes (Signed)
eLink Physician-Brief Progress Note Patient Name: Raymond Roberson DOB: 1956-02-08 MRN: [id number]   Date of Service  07/03/2023  HPI/Events of Note  67 year old male with a history of coronary artery disease status post CABG, essential hypertension and heart failure with reduced ejection fraction who reports increased blood pressure and heart rate past few weeks associated with dizziness and dyspnea.  Patient was noted to be in alcohol withdrawal with atrial fibrillation with rapid ventricular response and was intubated in the setting of acute encephalopathy.  He was noted to be tachypneic and tachycardic into the 130s, received amiodarone drip and was eventually intubated on the ventilator with propofol.  Metabolic panel shows no significant findings.  Troponins not elevated.  Blood counts show mild microcytic anemia.  Blood gas on the ventilator showed respiratory alkalosis with appropriate oxygenation, rate was turned down from 18->14.  Radiograph has poor technique but endotracheal tube was appropriately positioned and no obvious consolidations are noted.  eICU Interventions  Anticipate transfer to [facility] once bed assignment has been made  Discontinue maintenance IV fluids  Discontinue Precedex order  GI prophylaxis with home pantoprazole  DVT prophylaxis with apixaban  Multivitamins in place, Ativan as needed in place.  Consider transition to phenobarbital or tapered benzo once he is transferred.     Intervention Category Evaluation Type: New Patient Evaluation  Sonnia Strong 07/03/2023, 3:18 AM

## 2023-07-03 NOTE — Assessment & Plan Note (Signed)
-  No significant regurgitation or stenosis from echo in March 2024 -Continue to follow recommendations and further treatment by cardiology service.

## 2023-07-03 NOTE — Progress Notes (Signed)
Pharmacy Phenobarbital Consult Note   Pharmacy Consult for IV Phenobarbital  Indication: Alcohol Withdrawal Treatment  Labs:  Lab Results  Component Value Date   CREATININE 0.75 07/03/2023   AST 28 07/02/2023   ALT 30 07/02/2023   ALKPHOS 62 07/02/2023    Nursing Bedside Screening:   Last known drink: 7/8 10pm  Assessment: Raymond Roberson is a 67 y.o. year old male admitted on 07/02/2023. Patients meets criteria for active delirium tremens dosing of phenobarbital. Pharmacy consulted to dose phenobarbital.    Plan: Start active DT taper IV: Phenobarbital 260mg  IV x1 followed by Phenobarbital 97.5mg  IV push q 8h x 6 doses followed by Phenobarbital 65mg  IV push q 8h x 6 doses followed by Phenobarbital 32.4mg  IV push q 8h x 6 doses Start Lorazepam 1mg  IV q4h prn agitation     Thank you for allowing pharmacy to participate in this patient's care.  Emiliano Dyer Alaja Goldinger 07/03/2023,8:11 PM

## 2023-07-03 NOTE — Progress Notes (Addendum)
eLink Physician-Brief Progress Note Patient Name: Raymond Roberson DOB: Dec 29, 1955 MRN: [id number]   Date of Service  07/03/2023  HPI/Events of Note  67 year old male with a history of coronary artery disease status post CABG, essential hypertension and heart failure with reduced ejection fraction who reports increased blood pressure and heart rate past few weeks associated with dizziness and dyspnea. Patient was noted to be in alcohol withdrawal with atrial fibrillation with rapid ventricular response and was intubated in the setting of acute encephalopathy.   Transferred to [facility]. Stable hemodynamics.  eICU Interventions  Orders reviewed and unchanged.   GI prophylaxis with home pantoprazole   DVT prophylaxis with apixaban   0140 -  UOP is low, approx 220 ml total for the shift so far.  Continue observation, net -1.5 L the day before, known heart failure with reduced EF, attempting to remain net negative.  Intervention Category Evaluation Type: New Patient Evaluation  [name] 07/03/2023, 7:11 PM

## 2023-07-03 NOTE — Progress Notes (Signed)
Transition of Care [facility]) - Inpatient Brief Assessment   Patient Details  Name: Raymond Roberson MRN: [id number] Date of Birth: 1956-11-17  Transition of Care [facility] CM/SW Contact:    Annice Needy, LCSW Phone Number: 07/03/2023, 3:12 PM   Clinical Narrative: [facility] consult for SA resources/education. Patient currently intubated. TOC will follow up once patient is more medically stable.    Transition of Care Asessment: Insurance and Status: Insurance coverage has been reviewed Patient has primary care physician: Yes Home environment has been reviewed: from home with spouse Prior level of function:: functional Prior/Current Home Services: No current home services Social Determinants of Health Reivew: SDOH reviewed no interventions necessary Readmission risk has been reviewed: Yes Transition of care needs: no transition of care needs at this time

## 2023-07-04 ENCOUNTER — Other Ambulatory Visit (HOSPITAL_COMMUNITY): Payer: Self-pay

## 2023-07-04 ENCOUNTER — Telehealth (HOSPITAL_COMMUNITY): Payer: Self-pay | Admitting: Pharmacy Technician

## 2023-07-04 DIAGNOSIS — I350 Nonrheumatic aortic (valve) stenosis: Secondary | ICD-10-CM

## 2023-07-04 DIAGNOSIS — I5020 Unspecified systolic (congestive) heart failure: Secondary | ICD-10-CM

## 2023-07-04 DIAGNOSIS — I4892 Unspecified atrial flutter: Secondary | ICD-10-CM | POA: Diagnosis not present

## 2023-07-04 DIAGNOSIS — J9601 Acute respiratory failure with hypoxia: Secondary | ICD-10-CM | POA: Diagnosis not present

## 2023-07-04 DIAGNOSIS — F10939 Alcohol use, unspecified with withdrawal, unspecified: Secondary | ICD-10-CM | POA: Diagnosis not present

## 2023-07-04 DIAGNOSIS — I2510 Atherosclerotic heart disease of native coronary artery without angina pectoris: Secondary | ICD-10-CM | POA: Diagnosis not present

## 2023-07-04 DIAGNOSIS — F10931 Alcohol use, unspecified with withdrawal delirium: Secondary | ICD-10-CM | POA: Diagnosis not present

## 2023-07-04 LAB — GLUCOSE, CAPILLARY
Glucose-Capillary: 113 mg/dL — ABNORMAL HIGH (ref 70–99)
Glucose-Capillary: 73 mg/dL (ref 70–99)
Glucose-Capillary: 77 mg/dL (ref 70–99)
Glucose-Capillary: 82 mg/dL (ref 70–99)
Glucose-Capillary: 83 mg/dL (ref 70–99)
Glucose-Capillary: 87 mg/dL (ref 70–99)

## 2023-07-04 LAB — CBC
HCT: 35.7 % — ABNORMAL LOW (ref 39.0–52.0)
Hemoglobin: 10.8 g/dL — ABNORMAL LOW (ref 13.0–17.0)
MCH: 22.6 pg — ABNORMAL LOW (ref 26.0–34.0)
MCHC: 30.3 g/dL (ref 30.0–36.0)
MCV: 74.8 fL — ABNORMAL LOW (ref 80.0–100.0)
Platelets: 237 10*3/uL (ref 150–400)
RBC: 4.77 MIL/uL (ref 4.22–5.81)
RDW: 16.4 % — ABNORMAL HIGH (ref 11.5–15.5)
WBC: 8.6 10*3/uL (ref 4.0–10.5)
nRBC: 0 % (ref 0.0–0.2)

## 2023-07-04 LAB — BASIC METABOLIC PANEL
Anion gap: 10 (ref 5–15)
BUN: 8 mg/dL (ref 8–23)
CO2: 24 mmol/L (ref 22–32)
Calcium: 8.8 mg/dL — ABNORMAL LOW (ref 8.9–10.3)
Chloride: 103 mmol/L (ref 98–111)
Creatinine, Ser: 1 mg/dL (ref 0.61–1.24)
GFR, Estimated: >60 mL/min (ref >60)
Glucose, Bld: 86 mg/dL (ref 70–99)
Potassium: 3.6 mmol/L (ref 3.5–5.1)
Sodium: 137 mmol/L (ref 135–145)

## 2023-07-04 LAB — MAGNESIUM
Magnesium: 2.1 mg/dL (ref 1.7–2.4)
Magnesium: 2.3 mg/dL (ref 1.7–2.4)

## 2023-07-04 LAB — PHOSPHORUS: Phosphorus: 4.8 mg/dL — ABNORMAL HIGH (ref 2.5–4.6)

## 2023-07-04 MED ORDER — METOPROLOL TARTRATE 12.5 MG HALF TABLET: 12.5000 mg | ORAL_TABLET | Freq: Three times a day (TID) | ORAL | Status: DC

## 2023-07-04 MED ORDER — METOPROLOL TARTRATE 12.5 MG HALF TABLET
12.5000 mg | ORAL_TABLET | Freq: Two times a day (BID) | ORAL | Status: DC
  Administered 2023-07-04: 12.5 mg via NASOGASTRIC
  Filled 2023-07-04 (×2): qty 1

## 2023-07-04 MED ORDER — ORAL CARE MOUTH RINSE: 15.0000 mL | OROMUCOSAL | Status: DC | PRN

## 2023-07-04 MED ORDER — LORAZEPAM 1 MG PO TABS: 1.0000 mg | ORAL_TABLET | Freq: Three times a day (TID) | ORAL | Status: DC

## 2023-07-04 MED ORDER — FENTANYL CITRATE PF 50 MCG/ML IJ SOSY
25.0000 ug | PREFILLED_SYRINGE | INTRAMUSCULAR | Status: DC | PRN
  Administered 2023-07-04 – 2023-07-05 (×4): 50 ug via INTRAVENOUS
  Filled 2023-07-04 (×5): qty 1

## 2023-07-04 MED ORDER — POTASSIUM CHLORIDE 20 MEQ PO PACK
40.0000 meq | PACK | Freq: Once | ORAL | Status: AC
  Administered 2023-07-04: 40 meq
  Filled 2023-07-04: qty 2

## 2023-07-04 MED ORDER — ORAL CARE MOUTH RINSE
15.0000 mL | OROMUCOSAL | Status: DC
  Administered 2023-07-04 – 2023-07-06 (×25): 15 mL via OROMUCOSAL

## 2023-07-04 MED ORDER — LACTATED RINGERS IV BOLUS
500.0000 mL | Freq: Once | INTRAVENOUS | Status: AC
  Administered 2023-07-04: 500 mL via INTRAVENOUS

## 2023-07-04 MED ORDER — METOPROLOL TARTRATE 5 MG/5ML IV SOLN
2.5000 mg | Freq: Three times a day (TID) | INTRAVENOUS | Status: DC | PRN
  Administered 2023-07-08 – 2023-07-10 (×2): 5 mg via INTRAVENOUS
  Filled 2023-07-04 (×3): qty 5

## 2023-07-04 MED ORDER — VITAL AF 1.2 CAL PO LIQD
1000.0000 mL | ORAL | Status: DC
  Administered 2023-07-04 – 2023-07-05 (×2): 1000 mL

## 2023-07-04 MED ORDER — LORAZEPAM 1 MG PO TABS
1.0000 mg | ORAL_TABLET | Freq: Four times a day (QID) | ORAL | Status: AC
  Administered 2023-07-05 – 2023-07-06 (×4): 1 mg
  Filled 2023-07-04 (×4): qty 1

## 2023-07-04 MED ORDER — LORAZEPAM 1 MG PO TABS
1.0000 mg | ORAL_TABLET | ORAL | Status: AC
  Administered 2023-07-04 – 2023-07-05 (×6): 1 mg
  Filled 2023-07-04 (×6): qty 1

## 2023-07-04 NOTE — TOC Benefit Eligibility Note (Signed)
Pharmacy Patient Advocate Encounter  Insurance verification completed.    The patient is insured through [employer]   Ran test claim for dabigatran (Pradaxa) 150 mg and the current 30 day co-pay is $95.00.  Ran test claim for Farxgia 10 mg and the current 30 day co-pay is $95.00.  Ran test claim for Entresto 24-26 mg and the current 30 day co-pay is $45.00.  Ran test claim for Jardiance 10 mg and the current 30 day co-pay is $45.00.  Ran test claim for Savaysa 30 mg and Not on Formulary  Ran test claim for Eliquis 5 mg and Not covered to due Heart Valve   This test claim was processed through [employer]- copay amounts may vary at other pharmacies due to [employer], or as the patient moves through the different stages of their insurance plan.    Roland Earl, CPHT Pharmacy Patient Advocate Specialist [facility] Patient Advocate Team Direct Number: [phone]  Fax: [phone]

## 2023-07-04 NOTE — Progress Notes (Signed)
Nutrition Follow-up  DOCUMENTATION CODES:   Not applicable  INTERVENTION:   Tube Feeding via OG:  Vital AF 1.2 at 65 ml/hr Begin TF at rate of 20 ml/hr; titrate by 10 mL q 8 hours until goal rate of 65 ml/hr TF provides 1872 kcals, 117 g of protein, 1264 mL of free water  Additional kcals via propofol infusion  Continue MVI, thiamine, folic acid  NUTRITION DIAGNOSIS:   Inadequate oral intake related to inability to eat as evidenced by NPO status.  Being addressed via TF  GOAL:   Provide needs based on ASPEN/SCCM guidelines  Progressing  MONITOR:   Vent status, TF tolerance, Weight trends, Labs  REASON FOR ASSESSMENT:   Ventilator    ASSESSMENT:   67 yo male admitted with toxic metabolic encephalopathy and acute respiratory failure due to EtOH withdrawal requiring intubation. PMH includes CAD s/p CABG, ICM, HTN, HFrEF, EtOH abuse (reportedly drinks 12 beers a day)  Pt remains on vent support Propofol: 24.4 ml/hr (644 kcals)  OG tube in stomach per abd xray  Current weight 85.1 kg; no clear weight trend per weight encounters, weights all over the place  Labs: reviewed Meds: folic acid, thiamine, MVI   Diet Order:   Diet Order             Diet NPO time specified  Diet effective now                   EDUCATION NEEDS:   Not appropriate for education at this time  Skin:  Skin Assessment: Reviewed RN Assessment  Last BM:  7/8  Height:   Ht Readings from Last 1 Encounters:  07/03/23 6' (1.829 m)    Weight:   Wt Readings from Last 1 Encounters:  07/03/23 85.1 kg     BMI:  Body mass index is 25.44 kg/m.  Estimated Nutritional Needs:   Kcal:  2200-2400  Protein:  115-122 gr  Fluid:  > 2 liters daily    Romelle Starcher MS, RDN, LDN, CNSC Registered Dietitian 3 Clinical Nutrition RD Pager and On-Call Pager Number Located in [place]

## 2023-07-04 NOTE — Telephone Encounter (Signed)
Pharmacy Patient Advocate Encounter  Received notification from [facility] that Prior Authorization for Eliquis 5MG  tablets  has been APPROVED from 07/04/2023 to 12/25/2023.Marland Kitchen  PA #/Case ID/Reference #: [id number]  Copay is $45.00

## 2023-07-04 NOTE — Progress Notes (Signed)
Rounding Note    Patient Name: Raymond Roberson Date of Encounter: 07/04/2023  [facility] Cardiologist: Marjo Bicker, MD   Subjective   Currently intubated and sedated.  Inpatient Medications    Scheduled Meds:  apixaban  5 mg Per Tube BID   aspirin  81 mg Per Tube Daily   atorvastatin  80 mg Per Tube QHS   Chlorhexidine Gluconate Cloth  6 each Topical Daily   famotidine  20 mg Per Tube BID   folic acid  1 mg Per Tube Daily   HYDROcodone-acetaminophen  1 tablet Per Tube QID   multivitamin with minerals  1 tablet Per Tube Daily   pantoprazole (PROTONIX) IV  40 mg Intravenous Daily   PHENObarbital  97.5 mg Intravenous Q8H   Followed by   [START ON 07/06/2023] PHENObarbital  65 mg Intravenous Q8H   Followed by   [START ON 07/08/2023] PHENObarbital  32.5 mg Intravenous Q8H   thiamine  100 mg Per Tube Daily   Continuous Infusions:  amiodarone 30 mg/hr (07/04/23 0600)   propofol (DIPRIVAN) infusion 60 mcg/kg/min (07/04/23 0734)   PRN Meds: acetaminophen **OR** acetaminophen, fentaNYL (SUBLIMAZE) injection, LORazepam, metoprolol tartrate, midazolam, mouth rinse, polyethylene glycol   Vital Signs    Vitals:   07/04/23 0400 07/04/23 0500 07/04/23 0600 07/04/23 0700  BP: (!) 112/95 121/76 99/74 116/79  Pulse: 100 97 (!) 103 95  Resp: 15 16 15 14   Temp:   98.6 F (37 C)   TempSrc:      SpO2: 100% 99% 96% 97%  Weight:      Height:        Intake/Output Summary (Last 24 hours) at 07/04/2023 0819 Last data filed at 07/04/2023 0739 Gross per 24 hour  Intake 1091.81 ml  Output 1740 ml  Net -648.19 ml      07/03/2023    7:00 PM 07/03/2023    5:05 AM 07/02/2023    6:39 PM  Last 3 Weights  Weight (lbs) 187 lb 9.8 oz 192 lb 7.4 oz 191 lb 8 oz  Weight (kg) 85.1 kg 87.3 kg 86.864 kg      Telemetry    Atrial flutter, rates 126 fixed  - Personally Reviewed  ECG    No new  Physical Exam   GEN: intubated/sedated   Neck: No JVD Cardiac: regular and  tachycardic Respiratory: Clear on vent GI: Soft, nontender, non-distended  MS: No edema; No deformity. Cool extremities but 3/4 DP pulse Neuro:  sedated Psych: unable to assess, intubated and sedated.   Labs    High Sensitivity Troponin:   Recent Labs  Lab 07/02/23 1449 07/02/23 1629  TROPONINIHS 25* 25*     Chemistry Recent Labs  Lab 07/02/23 1449 07/03/23 0455 07/04/23 0225  NA 134* 136 137  K 3.8 3.8 3.6  CL 99 104 103  CO2 26 23 24   GLUCOSE 107* 108* 86  BUN 13 11 8   CREATININE 0.84 0.75 1.00  CALCIUM 9.3 8.7* 8.8*  MG 2.1  --   --   PROT 8.3*  --   --   ALBUMIN 4.0  --   --   AST 28  --   --   ALT 30  --   --   ALKPHOS 62  --   --   BILITOT 1.3*  --   --   GFRNONAA >60 >60 >60  ANIONGAP 9 9 10     Lipids  Recent Labs  Lab 07/03/23 0455  TRIG 120    Hematology Recent Labs  Lab 07/02/23 1449 07/03/23 0455 07/04/23 0225  WBC 8.1 6.4 8.6  RBC 5.39 4.47 4.77  HGB 11.7* 9.8* 10.8*  HCT 40.7 33.3* 35.7*  MCV 75.5* 74.5* 74.8*  MCH 21.7* 21.9* 22.6*  MCHC 28.7* 29.4* 30.3  RDW 16.3* 16.1* 16.4*  PLT 299 229 237   Thyroid No results for input(s): "TSH", "FREET4" in the last 168 hours.  BNPNo results for input(s): "BNP", "PROBNP" in the last 168 hours.  DDimer No results for input(s): "DDIMER" in the last 168 hours.   Radiology    DG Abd Portable 1V  Result Date: 07/03/2023 CLINICAL DATA:  Orogastric tube placement. EXAM: PORTABLE ABDOMEN - 1 VIEW COMPARISON:  None Available. FINDINGS: The bowel gas pattern is normal. Distal tip of the nasogastric tube is seen in expected position of proximal stomach. No radio-opaque calculi or other significant radiographic abnormality are seen. IMPRESSION: Distal tip of nasogastric tube seen in expected position of proximal stomach. Electronically Signed   By: Lupita Raider M.D.   On: 07/03/2023 13:40   ECHOCARDIOGRAM LIMITED  Result Date: 07/03/2023    ECHOCARDIOGRAM LIMITED REPORT   Patient Name:   Raymond Roberson Date of Exam: 07/03/2023 Medical Rec #:  [id number]        Height:       72.0 in Accession #:    [id number]       Weight:       192.5 lb Date of Birth:  1956/11/16       BSA:          2.096 m Patient Age:    66 years         BP:           106/67 mmHg Patient Gender: M                HR:           63 bpm. [facility] Procedure: Limited Echo, Limited Color Doppler, Cardiac Doppler and Intracardiac            Opacification Agent Indications:    Atrial Flutter I48.92  History:        Patient has prior history of Echocardiogram examinations, most                 recent 03/07/2023. CAD, Prior CABG, Alcohol withdraw, Aortic                 Valve Disease, Arrythmias:Atrial Flutter; Risk                 Factors:Hypertension, Sleep Apnea, Diabetes and Dyslipidemia.                 Aortic Valve: 27 mm Edwards valve is present in the aortic                 position. Procedure Date: 01/23/23.  Sonographer:    Aron Baba Referring Phys: [id number] Ellsworth Lennox  Sonographer Comments: Echo performed with patient supine and on artificial respirator. IMPRESSIONS  1. Inferior sepatal and apical hypokinesis . Left ventricular ejection fraction, by estimation, is 35 to 40%. The left ventricle has moderately decreased function. The left ventricle demonstrates regional wall motion abnormalities (see scoring diagram/findings for description). The left ventricular internal cavity size was mildly dilated. Left ventricular diastolic parameters are indeterminate.  2. Right ventricular systolic function is normal. The right ventricular size is normal.  3. Left atrial size was moderately dilated.  4. The mitral valve is abnormal. Mild mitral valve regurgitation. No evidence of mitral stenosis.  5. No PVL normal gradients for valve Peak gradient only 10.8 mmHg mean 7.4 mmHg . The aortic valve has been repaired/replaced. Aortic valve regurgitation is not visualized. No aortic stenosis is present. There is a 27 mm Edwards  valve present in the aortic position. Procedure Date: 01/23/23.  6. The inferior vena cava is dilated in size with >50% respiratory variability, suggesting right atrial pressure of 8 mmHg. FINDINGS  Left Ventricle: Inferior sepatal and apical hypokinesis. Left ventricular ejection fraction, by estimation, is 35 to 40%. The left ventricle has moderately decreased function. The left ventricle demonstrates regional wall motion abnormalities. Definity contrast agent was given IV to delineate the left ventricular endocardial borders. The left ventricular internal cavity size was mildly dilated. There is no left ventricular hypertrophy. Left ventricular diastolic parameters are indeterminate. Right Ventricle: The right ventricular size is normal. No increase in right ventricular wall thickness. Right ventricular systolic function is normal. Left Atrium: Left atrial size was moderately dilated. Right Atrium: Right atrial size was normal in size. Pericardium: There is no evidence of pericardial effusion. Mitral Valve: The mitral valve is abnormal. There is mild thickening of the mitral valve leaflet(s). There is mild calcification of the mitral valve leaflet(s). Mild mitral valve regurgitation. No evidence of mitral valve stenosis. Tricuspid Valve: The tricuspid valve is normal in structure. Tricuspid valve regurgitation is not demonstrated. No evidence of tricuspid stenosis. Aortic Valve: No PVL normal gradients for valve Peak gradient only 10.8 mmHg mean 7.4 mmHg. The aortic valve has been repaired/replaced. Aortic valve regurgitation is not visualized. No aortic stenosis is present. There is a 27 mm Edwards valve present in the aortic position. Procedure Date: 01/23/23. Pulmonic Valve: The pulmonic valve was normal in structure. Pulmonic valve regurgitation is not visualized. No evidence of pulmonic stenosis. Aorta: The aortic root is normal in size and structure. Venous: The inferior vena cava is dilated in size with  greater than 50% respiratory variability, suggesting right atrial pressure of 8 mmHg. IAS/Shunts: No atrial level shunt detected by color flow Doppler. Additional Comments: Spectral Doppler performed. Color Doppler performed.  LEFT VENTRICLE PLAX 2D LVIDd:         5.10 cm LVIDs:         4.60 cm LV PW:         1.40 cm LV IVS:        1.10 cm  LEFT ATRIUM         Index LA diam:    4.80 cm 2.29 cm/m  TRICUSPID VALVE TR Peak grad:   23.2 mmHg TR Vmax:        241.00 cm/s Charlton Haws MD Electronically signed by Charlton Haws MD Signature Date/Time: 07/03/2023/10:47:24 AM    Final    DG CHEST PORT 1 VIEW  Result Date: 07/03/2023 CLINICAL DATA:  [id number] Endotracheally intubated [id number] EXAM: PORTABLE CHEST 1 VIEW COMPARISON:  07/02/2023. FINDINGS: Endotracheal tube tip is 6.5 cm from the carina. An enteric tube extends below diaphragm, tip out of the view of the radiograph. There are surgical staples along the heart border and sternotomy wires, status post CABG (coronary artery bypass graft). Prosthetic aortic valve noted. Bilateral lung fields are clear. Bilateral costophrenic angles are clear. Stable cardio-mediastinal silhouette. No acute osseous abnormalities. Old healed right lateral rib fractures noted. The soft tissues are within normal limits. IMPRESSION: 1. Endotracheal tube tip  is 6.5 cm from the carina. 2. Rest of the tubes and lines, as described above. 3. No acute cardiopulmonary abnormality. Electronically Signed   By: Jules Schick M.D.   On: 07/03/2023 08:37   DG Chest Portable 1 View  Result Date: 07/02/2023 CLINICAL DATA:  Intubation and orogastric tube. EXAM: PORTABLE CHEST 1 VIEW COMPARISON:  Chest x-ray 07/02/2023 FINDINGS: Endotracheal tube tip is 4.4 cm above the carina. Enteric tube extends below the diaphragm into the stomach. The distal tip is not included on the image. The heart is enlarged. Patient is status post cardiac valve replacement. There is no focal lung infiltrate, pleural effusion,  or pneumothorax. IMPRESSION: Endotracheal tube tip is 4.4 cm above the carina. Enteric tube extends below the diaphragm into the stomach. Electronically Signed   By: Darliss Cheney M.D.   On: 07/02/2023 23:41   DG Chest Port 1 View  Result Date: 07/02/2023 CLINICAL DATA:  Hypertension EXAM: PORTABLE CHEST 1 VIEW COMPARISON:  03/07/2023 FINDINGS: Stable cardiomegaly status post sternotomy and aortic valve replacement. Minimal left basilar scarring or atelectasis. Lungs are otherwise clear. No pleural effusion or pneumothorax. IMPRESSION: Minimal left basilar scarring or atelectasis. No acute cardiopulmonary findings. Electronically Signed   By: Duanne Guess D.O.   On: 07/02/2023 15:16    Cardiac Studies   Echocardiogram: 02/2023 IMPRESSIONS     1. Left ventricular ejection fraction, by estimation, is 40 to 45%. The  left ventricle has mildly decreased function. The left ventricle  demonstrates regional wall motion abnormalities (see scoring  diagram/findings for description). There is mild left  ventricular hypertrophy. Left ventricular diastolic parameters are  consistent with Grade I diastolic dysfunction (impaired relaxation).   2. Right ventricular systolic function is normal. The right ventricular  size is normal.   3. The mitral valve is normal in structure. Trivial mitral valve  regurgitation. No evidence of mitral stenosis.   4. The aortic valve has been repaired/replaced. Aortic valve  regurgitation is not visualized. No aortic stenosis is present. There is a  bioprosthetic valve present in the aortic position.   5. The inferior vena cava is normal in size with greater than 50%  respiratory variability, suggesting right atrial pressure of 3 mmHg.   Patient Profile     67 y.o. male w/ PMH of CAD (s/p CABG with LIMA-LAD in 12/2022 with known CTO of RCA with collaterals, severe AS (s/p SAVR in 12/2022), HTN, HLD and alcohol abuse who is currently admitted for alcohol withdrawal.  Cardiology consulted due to newly diagnosed atrial flutter with RVR.   Assessment & Plan     1. Atrial Flutter with RVR - New diagnosis for the patient this admission but occurring in the setting of alcohol withdrawal as he was consuming 12+ beers daily by review of notes.  - Was started on IV Amiodarone and rates have improved. Would continue with IV Amiodarone today. Also started on Eliquis 5mg  BID (per tube) and continued on Lopressor 12.5mg  BID. Pending his clinical course, could consider TEE-guided DCCV later this admission. At this time, at very-high risk for recurrence given his alcohol withdrawal.  - rates elevated today add back metop 12.5 mg BID and watch for hypotension. Continue amio IV  2. CAD - He underwent CABG x1 in 12/2022 with LIMA-LAD and has a known CTO of the RCA. Hs Troponin flat at 25 this admission. Repeat echo pending. Remains on ASA, Atorvastatin and Lopressor. Plavix was stopped yesterday given the indication for anticoagulation.   3. Severe  AS - He is s/p AVR in 12/2022 with 27 mm Edwards Inspiris Resilia. Echo with stable valve function.  4. HFmrEF - His EF was at 30-35% in 12/2022, improved to 40-45% by repeat echo in 02/2023. EF mildly reduced again, may be in setting of rhythm. Not currently volume overloaded. He has been continued on Lopressor 12.5mg  BID but PTA Losartan and Spironolactone currently held given soft BP. Would restart GDMT as BP allows.   5. Alcohol Withdrawal - Initially was started on Ativan and Precedex and ultimately required intubation due to worsening agitation and unresponsiveness. Currently on Propofol.   6. Anemia - Baseline Hgb 10 - 11. At 11.7 on admission 10.8 today monitor serially.  For questions or updates, please contact [facility] Please consult [email or web address] for contact info under        Signed, Parke Poisson, MD  07/04/2023, 8:19 AM    CRITICAL CARE Performed by: Weston Brass, MD   Total critical  care time: 30 minutes   Critical care time was exclusive of separately billable procedures and treating other patients.   Critical care was necessary to treat or prevent imminent or life-threatening deterioration.   Critical care was time spent personally by me (independent of APPs or residents) on the following activities: development of treatment plan with patient and/or surrogate as well as nursing, discussions with consultants, evaluation of patient's response to treatment, examination of patient, obtaining history from patient or surrogate, ordering and performing treatments and interventions, ordering and review of laboratory studies, ordering and review of radiographic studies, pulse oximetry and re-evaluation of patient's condition.

## 2023-07-04 NOTE — Progress Notes (Signed)
NAME:  Raymond Roberson, MRN:  [id number], DOB:  1956/04/24, LOS: 2 ADMISSION DATE:  07/02/2023, CONSULTATION DATE: 07/04/23 REFERRING MD:  EDP, CHIEF COMPLAINT:  ETOH withdrawal   History of Present Illness:  Raymond Roberson is a 67 y.o. M with PMH significant for CAD s/p CABG 12/2022, Ischemic cardiomyopathy, HTN, HFrEF and ETOH abuse with reported 12 beers/day who presented to [facility] with two days of dizziness and shortness of breath.  He stopped drinking thinking that this would help (evening of 7/8), and then developed tremulousness and diaphoresis.   In the ED he was found to be in Afib/flutter and was treated with amiodarone.  He became increasingly agitated and required precedex and eventual intubation so was transferred to [facility].  Labs notable for flat troponin 25>25, ethanol <10, ABG 7.52/30/158/25  Pertinent  Medical History   has a past medical history of Aortic stenosis, Bicuspid aortic valve, CAD (coronary artery disease), Chronic alcohol abuse, Classic migraine (09/02/2013), CVA (cerebral vascular accident) (HCC), Diabetes mellitus without complication (HCC), GERD (gastroesophageal reflux disease), Gouty arthritis, Hypertension, Major depression, Mixed hyperlipidemia, Peptic ulcer disease, and Sleep apnea.  Significant Hospital Events: Including procedures, antibiotic start and stop dates in addition to other pertinent events   7/8 presented to [facility] in aflutter and developed worsening ETOH withdrawal requiring intubation 7/9 arrived to [facility] stable on propofol and CIWA  Interim History / Subjective:  AM labs not crossing in Epic  Na 137, K 3.6, CL 103, CO2 24, glucose 86, BUN/ sCr 8/ 1, WBC 8.6, H/H 10.8/ 35.7, plts 237  UOP 340 ml/ 12hrs  Objective   Blood pressure 99/74, pulse (!) 103, temperature 98.6 F (37 C), resp. rate 15, height 6' (1.829 m), weight 85.1 kg, SpO2 96 %.    Vent Mode: PRVC FiO2 (%):  [30 %] 30 % Set Rate:  [14 bmp] 14 bmp Vt Set:  [620 mL] 620 mL PEEP:  [5  cmH20] 5 cmH20 Plateau Pressure:  [13 cmH20-15 cmH20] 15 cmH20   Intake/Output Summary (Last 24 hours) at 07/04/2023 0713 Last data filed at 07/04/2023 0600 Gross per 24 hour  Intake 1284.54 ml  Output 1690 ml  Net -405.46 ml   Filed Weights   07/02/23 1839 07/03/23 0505 07/03/23 1900  Weight: 86.9 kg 87.3 kg 85.1 kg   Propofol 60 General:  critically ill appearing older male in bed, mildly agitated HEENT: MM pink/moist, pupils 3/r, anicteric, ETT/ OGT Neuro:  sedated, not opens eyes or f/c, MAE, pulling against restraints CV: rrir, rate 120 PULM:  MV supported, breathing over set rate, coarse> to clear after suctioning mild amount of tan secretions GI: soft, bs hypo, foley- cyu Extremities: warm/dry, no LE edema  Skin: no rashes, tattoos  UOP 1.6l/ 24hrs (340 last 12hrs) Net -500 ml Afebrile Labs as above  Resolved Hospital Problem list     Assessment & Plan:   Toxic metabolic encephalopathy due to ETOH withdrawal ETOH abuse  - last drink 7/8; 12 beers daily  - failed precedex gtt - stop phenobarbital taper, as phenobarbital can interact/ decrease eliquis.  Continue with propofol, add ativan enteral taper over 3 days, and prn versed.  Consider switching over to precedex as allows.   - cont thiamine, folate, MVI  - TOC/ cessation counseling as appropriate    Acute hypoxic respiratory failure secondary to above  CXR without acute abnormality P:  - cont full MV support, 4-8cc/kg IBW with goal Pplat <30 and DP<15  - VAP prevention protocol/  PPI - PAD protocol for sedation> propofol, versed, add prn fentanyl - intermittent CXR/ ABG - wean FiO2 as able for SpO2 >92%  - daily SAT & SBT when appropriate  - aggressive pulmonary hygiene - prn bronchodilators   New onset atrial fib/flutter  - cardiology following, appreciate input - cont amio and Eliquis - echo as below  - prn metoprolol  - optimize lytes- K, Mag  History of CAD w/CABG (12/2022 x1 LIMA-LAD), ischemic  cardiomyopathy/ HFrEF, HTN, POA - troponin flat - Cardiology following, appreciate input - echo 07/03/23 with EF down from 40-45 to 35-40%, similar inferior sepatal, and apical HK, normal RV, mild MR - cont ASA, Eliquis, statin, and lopressor.  Plavix stopped with eliquis  Microcytic anemia  - send anemia panel  - trend CBC, transfuse for Hgb < 7  At risk for AKI - UOP down over last 12 hrs, overall negative balance.  LR slow bolus.   - trend renal indices  - strict I/Os, daily wts - avoid nephrotoxins, renal dose meds, hemodynamic support as above  Best Practice (right click and "Reselect all SmartList Selections" daily)   Diet/type: NPO; TF per RD DVT prophylaxis: DOAC GI prophylaxis: PPI Lines: N/A Foley:  Yes, and it is still needed Code Status:  full code Last date of multidisciplinary goals of care discussion [pending]  Pending 7/10.   Labs   CBC: Recent Labs  Lab 07/02/23 1449 07/03/23 0455  WBC 8.1 6.4  NEUTROABS 6.9  --   HGB 11.7* 9.8*  HCT 40.7 33.3*  MCV 75.5* 74.5*  PLT 299 229    Basic Metabolic Panel: Recent Labs  Lab 07/02/23 1449 07/03/23 0455  NA 134* 136  K 3.8 3.8  CL 99 104  CO2 26 23  GLUCOSE 107* 108*  BUN 13 11  CREATININE 0.84 0.75  CALCIUM 9.3 8.7*  MG 2.1  --    GFR: Estimated Creatinine Clearance: 102.4 mL/min (by C-G formula based on SCr of 0.75 mg/dL). Recent Labs  Lab 07/02/23 1449 07/03/23 0455  WBC 8.1 6.4    Liver Function Tests: Recent Labs  Lab 07/02/23 1449  AST 28  ALT 30  ALKPHOS 62  BILITOT 1.3*  PROT 8.3*  ALBUMIN 4.0   Recent Labs  Lab 07/02/23 1449  LIPASE 47   No results for input(s): "AMMONIA" in the last 168 hours.  ABG    Component Value Date/Time   PHART 7.47 (H) 07/03/2023 0501   PCO2ART 37 07/03/2023 0501   PO2ART 109 (H) 07/03/2023 0501   HCO3 27.0 07/03/2023 0501   TCO2 25 01/23/2023 2024   ACIDBASEDEF 1.0 01/23/2023 2024   O2SAT 96 07/03/2023 0501     Coagulation  Profile: No results for input(s): "INR", "PROTIME" in the last 168 hours.  Cardiac Enzymes: No results for input(s): "CKTOTAL", "CKMB", "CKMBINDEX", "TROPONINI" in the last 168 hours.  HbA1C: Hgb A1c MFr Bld  Date/Time Value Ref Range Status  01/19/2023 08:11 PM 5.4 4.8 - 5.6 % Final    Comment:    (NOTE) Pre diabetes:          5.7%-6.4%  Diabetes:              >6.4%  Glycemic control for   <7.0% adults with diabetes     CBG: Recent Labs  Lab 07/03/23 0743 07/03/23 1120 07/03/23 1524 07/03/23 1928 07/04/23 0318  GLUCAP 100* 82 99 102* 77    Critical care time:  CRITICAL CARE Performed by: Posey Boyer   Total critical care time: 36 minutes  Critical care time was exclusive of separately billable procedures and treating other patients.  Critical care was necessary to treat or prevent imminent or life-threatening deterioration.  Critical care was time spent personally by me on the following activities: development of treatment plan with patient and/or surrogate as well as nursing, discussions with consultants, evaluation of patient's response to treatment, examination of patient, obtaining history from patient or surrogate, ordering and performing treatments and interventions, ordering and review of laboratory studies, ordering and review of radiographic studies, pulse oximetry and re-evaluation of patient's condition.     Posey Boyer, MSN, NP, AG-ACNP-BC [facility] 07/04/2023, 7:13 AM  See Amion for pager If no response to pager , please call [phone] until 7pm After 7:00 pm call Elink  [phone]

## 2023-07-04 NOTE — Telephone Encounter (Signed)
Pharmacy Patient Advocate Encounter   Received notification that prior authorization for Eliquis 5MG  tablets is required/requested.    PA submitted to [facility] via CoverMyMeds Key/confirmation #/EOC [id number] Status is pending

## 2023-07-04 NOTE — Progress Notes (Signed)
Labs drawn per phlebotomy at 0225. No results seen in system at 0430. This RN called lab - lab stated results are in system. IT was also called at this time. Per IT, there was a system error with the lab order from previous hospital.   Paper copy of labs were sent up at this time and placed in chart.

## 2023-07-04 NOTE — TOC Initial Note (Signed)
Transition of Care [facility]) - Initial/Assessment Note    Patient Details  Name: Raymond Roberson MRN: [id number] Date of Birth: December 02, 1956  Transition of Care [facility]) CM/SW Contact:    Elliot Cousin, RN Phone Number: [phone] 07/04/2023, 1:25 PM  Clinical Narrative:   TOC CM spoke to pt's SO, Crystal. States pt lives in the home with her at this time. States he was independent PTA. Referral received to discuss substance abuse counseling. Will discuss with pt when he medically stable. Will continue to follow for dc needs.                 Expected Discharge Plan: Home w Home Health Services Barriers to Discharge: Continued Medical Work up   Patient Goals and CMS Choice Patient states their goals for this hospitalization and ongoing recovery are:: wants to recover          Expected Discharge Plan and Services   Discharge Planning Services: CM Consult   Living arrangements for the past 2 months: Single Family Home                                      Prior Living Arrangements/Services Living arrangements for the past 2 months: Single Family Home Lives with:: Significant Other Patient language and need for interpreter reviewed:: Yes              Criminal Activity/Legal Involvement Pertinent to Current Situation/Hospitalization: No - Comment as needed  Activities of Daily Living Home Assistive Devices/Equipment: Walker (specify type), Cane (specify quad or straight), Bedside commode/3-in-1, CPAP ADL Screening (condition at time of admission) Patient's cognitive ability adequate to safely complete daily activities?: Yes Is the patient deaf or have difficulty hearing?: No Does the patient have difficulty seeing, even when wearing glasses/contacts?: No Does the patient have difficulty concentrating, remembering, or making decisions?: Yes (currently having withdrawal symptoms) Does the patient have difficulty dressing or bathing?: No Independently performs  ADLs?: Yes (appropriate for developmental age) Does the patient have difficulty walking or climbing stairs?: No Weakness of Legs: None Weakness of Arms/Hands: None  Permission Sought/Granted Permission sought to share information with : Family Supports, Case Manager Permission granted to share information with : Yes, Verbal Permission Granted  Share Information with NAME: Charity fundraiser     Permission granted to share info w Relationship: SO  Permission granted to share info w Contact Information: [phone]  Emotional Assessment   Attitude/Demeanor/Rapport: Intubated (Following Commands or Not Following Commands)     Alcohol / Substance Use: Alcohol Use    Admission diagnosis:  Alcohol withdrawal (HCC) [F10.939] Atrial flutter with rapid ventricular response (HCC) [I48.92] Alcohol withdrawal syndrome with complication (HCC) [F10.939] 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] Alcohol withdrawal with delirium (HCC) [F10.931] Patient Active Problem List   Diagnosis Date Noted   Acute hypoxic respiratory failure (HCC) 07/03/2023   Alcohol withdrawal (HCC) 07/02/2023   Atrial flutter with rapid ventricular response (HCC) 07/02/2023   Chronic back pain 07/02/2023   Alcohol withdrawal with delirium (HCC) 07/02/2023   CAD (coronary artery disease) 04/10/2023   HLD (hyperlipidemia) 04/10/2023   Ischemic cardiomyopathy 04/10/2023   S/P CABG x 1 04/10/2023   S/P AVR 01/23/2023   NSTEMI (non-ST elevated myocardial infarction) (HCC) 01/19/2023   Acute on chronic systolic CHF (congestive heart failure) (HCC) 01/19/2023   HTN (hypertension) 01/19/2023   Classic migraine 09/02/2013   Dizziness and giddiness 09/02/2013  PCP:  Richardean Chimera, MD Pharmacy:   [facility] - [place], [place] - [address] [place] [address] Phone: [phone] Fax: [phone]  [facility] [address] [place] [address] Phone: [phone] Fax:  [phone]  [facility] [address] [place] [address] Phone: [phone] Fax: [phone]  [facility] - [place], [place] - [address] [place] [address] Phone: [phone] Fax: [phone]     Social Determinants of Health (SDOH) Social History: SDOH Screenings   Food Insecurity: No Food Insecurity (07/02/2023)  Housing: Low Risk  (07/02/2023)  Transportation Needs: No Transportation Needs (07/02/2023)  Utilities: Not At Risk (07/02/2023)  Tobacco Use: High Risk (07/02/2023)   SDOH Interventions:     Readmission Risk Interventions     No data to display

## 2023-07-05 ENCOUNTER — Inpatient Hospital Stay (HOSPITAL_COMMUNITY): Payer: Medicare HMO

## 2023-07-05 DIAGNOSIS — J9602 Acute respiratory failure with hypercapnia: Secondary | ICD-10-CM | POA: Diagnosis not present

## 2023-07-05 DIAGNOSIS — I2510 Atherosclerotic heart disease of native coronary artery without angina pectoris: Secondary | ICD-10-CM | POA: Diagnosis not present

## 2023-07-05 DIAGNOSIS — F10931 Alcohol use, unspecified with withdrawal delirium: Secondary | ICD-10-CM | POA: Diagnosis not present

## 2023-07-05 DIAGNOSIS — I4892 Unspecified atrial flutter: Secondary | ICD-10-CM | POA: Diagnosis not present

## 2023-07-05 DIAGNOSIS — I350 Nonrheumatic aortic (valve) stenosis: Secondary | ICD-10-CM | POA: Diagnosis not present

## 2023-07-05 DIAGNOSIS — I5020 Unspecified systolic (congestive) heart failure: Secondary | ICD-10-CM | POA: Diagnosis not present

## 2023-07-05 LAB — IRON AND TIBC
Iron: 6 ug/dL — ABNORMAL LOW (ref 45–182)
Saturation Ratios: 2 % — ABNORMAL LOW (ref 17.9–39.5)
TIBC: 400 ug/dL (ref 250–450)
UIBC: 394 ug/dL

## 2023-07-05 LAB — RENAL FUNCTION PANEL
Albumin: 2.9 g/dL — ABNORMAL LOW (ref 3.5–5.0)
Anion gap: 15 (ref 5–15)
BUN: 11 mg/dL (ref 8–23)
CO2: 20 mmol/L — ABNORMAL LOW (ref 22–32)
Calcium: 8.5 mg/dL — ABNORMAL LOW (ref 8.9–10.3)
Chloride: 100 mmol/L (ref 98–111)
Creatinine, Ser: 0.99 mg/dL (ref 0.61–1.24)
GFR, Estimated: >60 mL/min (ref >60)
Glucose, Bld: 79 mg/dL (ref 70–99)
Phosphorus: 5.1 mg/dL — ABNORMAL HIGH (ref 2.5–4.6)
Potassium: 3.7 mmol/L (ref 3.5–5.1)
Sodium: 135 mmol/L (ref 135–145)

## 2023-07-05 LAB — GLUCOSE, CAPILLARY
Glucose-Capillary: 107 mg/dL — ABNORMAL HIGH (ref 70–99)
Glucose-Capillary: 124 mg/dL — ABNORMAL HIGH (ref 70–99)
Glucose-Capillary: 86 mg/dL (ref 70–99)
Glucose-Capillary: 89 mg/dL (ref 70–99)
Glucose-Capillary: 97 mg/dL (ref 70–99)
Glucose-Capillary: 98 mg/dL (ref 70–99)

## 2023-07-05 LAB — CBC
HCT: 39.3 % (ref 39.0–52.0)
Hemoglobin: 11.5 g/dL — ABNORMAL LOW (ref 13.0–17.0)
MCH: 21.6 pg — ABNORMAL LOW (ref 26.0–34.0)
MCHC: 29.3 g/dL — ABNORMAL LOW (ref 30.0–36.0)
MCV: 73.9 fL — ABNORMAL LOW (ref 80.0–100.0)
Platelets: 219 10*3/uL (ref 150–400)
RBC: 5.32 MIL/uL (ref 4.22–5.81)
RDW: 16.6 % — ABNORMAL HIGH (ref 11.5–15.5)
WBC: 11.1 10*3/uL — ABNORMAL HIGH (ref 4.0–10.5)
nRBC: 0 % (ref 0.0–0.2)

## 2023-07-05 LAB — FOLATE: Folate: 14.5 ng/mL (ref >5.9)

## 2023-07-05 LAB — PHOSPHORUS
Phosphorus: 5 mg/dL — ABNORMAL HIGH (ref 2.5–4.6)
Phosphorus: 4.3 mg/dL (ref 2.5–4.6)

## 2023-07-05 LAB — MAGNESIUM
Magnesium: 2.3 mg/dL (ref 1.7–2.4)
Magnesium: 2.1 mg/dL (ref 1.7–2.4)

## 2023-07-05 LAB — RETICULOCYTES
Immature Retic Fract: 16 % — ABNORMAL HIGH (ref 2.3–15.9)
RBC.: 5.33 MIL/uL (ref 4.22–5.81)
Retic Count, Absolute: 76.2 10*3/uL (ref 19.0–186.0)
Retic Ct Pct: 1.4 % (ref 0.4–3.1)

## 2023-07-05 LAB — VITAMIN B12: Vitamin B-12: 363 pg/mL (ref 180–914)

## 2023-07-05 LAB — FERRITIN: Ferritin: 7 ng/mL — ABNORMAL LOW (ref 24–336)

## 2023-07-05 MED ORDER — POTASSIUM CHLORIDE 20 MEQ PO PACK
40.0000 meq | PACK | Freq: Once | ORAL | Status: AC
  Administered 2023-07-05: 40 meq
  Filled 2023-07-05: qty 2

## 2023-07-05 MED ORDER — DEXMEDETOMIDINE HCL IN NACL 400 MCG/100ML IV SOLN
0.0000 ug/kg/h | INTRAVENOUS | Status: DC
  Administered 2023-07-05: 1.2 ug/kg/h via INTRAVENOUS
  Administered 2023-07-05: 0.4 ug/kg/h via INTRAVENOUS
  Administered 2023-07-05: 1 ug/kg/h via INTRAVENOUS
  Administered 2023-07-05: 1.2 ug/kg/h via INTRAVENOUS
  Administered 2023-07-05: 1 ug/kg/h via INTRAVENOUS
  Administered 2023-07-06: 1.2 ug/kg/h via INTRAVENOUS
  Administered 2023-07-06: 1.1 ug/kg/h via INTRAVENOUS
  Administered 2023-07-06: 0.9 ug/kg/h via INTRAVENOUS
  Administered 2023-07-06: 1.2 ug/kg/h via INTRAVENOUS
  Administered 2023-07-06: 0.4 ug/kg/h via INTRAVENOUS
  Administered 2023-07-07: 1.4 ug/kg/h via INTRAVENOUS
  Administered 2023-07-07: 1.3 ug/kg/h via INTRAVENOUS
  Administered 2023-07-07: 1.4 ug/kg/h via INTRAVENOUS
  Administered 2023-07-07: 1.5 ug/kg/h via INTRAVENOUS
  Administered 2023-07-07: 1.4 ug/kg/h via INTRAVENOUS
  Administered 2023-07-08 (×3): 1.6 ug/kg/h via INTRAVENOUS
  Filled 2023-07-05 (×19): qty 100

## 2023-07-05 MED ORDER — LACTATED RINGERS IV BOLUS
250.0000 mL | Freq: Once | INTRAVENOUS | Status: AC
  Administered 2023-07-05: 250 mL via INTRAVENOUS

## 2023-07-05 MED ORDER — FENTANYL 2500MCG IN NS 250ML (10MCG/ML) PREMIX INFUSION
50.0000 ug/h | INTRAVENOUS | Status: DC
  Administered 2023-07-05: 50 ug/h via INTRAVENOUS
  Filled 2023-07-05: qty 250

## 2023-07-05 MED ORDER — THIAMINE MONONITRATE 100 MG PO TABS: 100.0000 mg | ORAL_TABLET | Freq: Every day | ORAL | Status: DC

## 2023-07-05 MED ORDER — THIAMINE HCL 100 MG/ML IJ SOLN
500.0000 mg | Freq: Every day | INTRAVENOUS | Status: AC
  Administered 2023-07-05 – 2023-07-07 (×3): 500 mg via INTRAVENOUS
  Filled 2023-07-05 (×3): qty 5

## 2023-07-05 MED ORDER — THIAMINE MONONITRATE 100 MG PO TABS: 500.0000 mg | ORAL_TABLET | Freq: Every day | ORAL | Status: DC

## 2023-07-05 MED ORDER — NOREPINEPHRINE 4 MG/250ML-% IV SOLN
2.0000 ug/min | INTRAVENOUS | Status: DC
  Administered 2023-07-05 – 2023-07-06 (×2): 2 ug/min via INTRAVENOUS
  Filled 2023-07-05 (×2): qty 250

## 2023-07-05 MED ORDER — SODIUM CHLORIDE 0.9 % IV SOLN
2.0000 g | Freq: Every day | INTRAVENOUS | Status: DC
  Administered 2023-07-05: 2 g via INTRAVENOUS
  Filled 2023-07-05: qty 20

## 2023-07-05 MED ORDER — FENTANYL BOLUS VIA INFUSION
50.0000 ug | INTRAVENOUS | Status: DC | PRN
  Administered 2023-07-05 – 2023-07-06 (×6): 100 ug via INTRAVENOUS

## 2023-07-05 MED ORDER — SODIUM CHLORIDE 0.9 % IV SOLN
3.0000 g | Freq: Four times a day (QID) | INTRAVENOUS | Status: DC
  Administered 2023-07-05 – 2023-07-07 (×8): 3 g via INTRAVENOUS
  Filled 2023-07-05 (×8): qty 8

## 2023-07-05 MED ORDER — ROCURONIUM BROMIDE 10 MG/ML (PF) SYRINGE
PREFILLED_SYRINGE | INTRAVENOUS | Status: AC
  Filled 2023-07-05: qty 10

## 2023-07-05 MED ORDER — SODIUM CHLORIDE 0.9 % IV SOLN
250.0000 mL | INTRAVENOUS | Status: DC
  Administered 2023-07-05 – 2023-07-07 (×2): 250 mL via INTRAVENOUS

## 2023-07-05 NOTE — Procedures (Signed)
Intubation Procedure Note, endotracheal tube exchange  ORBY TANGEN  [id number]  1956-08-23  Date:07/05/23  Time:9:41 AM   Provider Performing:Moe Brier L Euna Armon    Procedure: Intubation (31500)  Indication(s) Respiratory Failure  Consent Unable to obtain consent due to emergent nature of procedure.   Anesthesia Propofol   Time Out Verified patient identification, verified procedure, site/side was marked, verified correct patient position, special equipment/implants available, medications/allergies/relevant history reviewed, required imaging and test results available.   Sterile Technique Usual hand hygeine, masks, and gloves were used   Procedure Description Patient's endotracheal tube was exchanged over blue bougie.  There was no complications normal color change on CO2 detector.  Complications/Tolerance None; patient tolerated the procedure well. Chest X-ray is ordered to verify placement.   EBL Minimal   Specimen(s) None

## 2023-07-05 NOTE — Progress Notes (Signed)
NAME:  MELECIO CUETO, MRN:  [id number], DOB:  09/25/1956, LOS: 3 ADMISSION DATE:  07/02/2023, CONSULTATION DATE: 07/05/23 REFERRING MD:  EDP, CHIEF COMPLAINT:  ETOH withdrawal   History of Present Illness:  Davarion Cuffee is a 67 y.o. M with PMH significant for CAD s/p CABG 12/2022, Ischemic cardiomyopathy, HTN, HFrEF and ETOH abuse with reported 12 beers/day who presented to [facility] ED with two days of dizziness and shortness of breath.  He stopped drinking thinking that this would help (evening of 7/8), and then developed tremulousness and diaphoresis.   In the ED he was found to be in Afib/flutter and was treated with amiodarone.  He became increasingly agitated and required precedex and eventual intubation so was transferred to [facility].  Labs notable for flat troponin 25>25, ethanol <10, ABG 7.52/30/158/25  Pertinent  Medical History   has a past medical history of Aortic stenosis, Bicuspid aortic valve, CAD (coronary artery disease), Chronic alcohol abuse, Classic migraine (09/02/2013), CVA (cerebral vascular accident) (HCC), Diabetes mellitus without complication (HCC), GERD (gastroesophageal reflux disease), Gouty arthritis, Hypertension, Major depression, Mixed hyperlipidemia, Peptic ulcer disease, and Sleep apnea.  Significant Hospital Events: Including procedures, antibiotic start and stop dates in addition to other pertinent events   7/8 presented to [facility] in aflutter and developed worsening ETOH withdrawal requiring intubation 7/9 arrived to [facility] stable on propofol and CIWA  Interim History / Subjective:  Tmax 101.4 Increased secretions Progressive hypotension with sedation/ lopressor  Objective   Blood pressure 108/81, pulse (!) 120, temperature (!) 101.2 F (38.4 C), temperature source Oral, resp. rate (!) 25, height 6' (1.829 m), weight 84.8 kg, SpO2 100%.    Vent Mode: PRVC FiO2 (%):  [30 %] 30 % Set Rate:  [14 bmp] 14 bmp Vt Set:  [620 mL] 620 mL PEEP:  [5 cmH20] 5 cmH20 Plateau  Pressure:  [15 cmH20-18 cmH20] 18 cmH20   Intake/Output Summary (Last 24 hours) at 07/05/2023 0740 Last data filed at 07/05/2023 0700 Gross per 24 hour  Intake 2190.6 ml  Output 1195 ml  Net 995.6 ml   Filed Weights   07/03/23 0505 07/03/23 1900 07/05/23 0421  Weight: 87.3 kg 85.1 kg 84.8 kg    General:  critically ill elderly male sedated on MV HEENT: MM pink/moist, ETT with near continuous air leak, OGT, pupils 4/r Neuro: sedated, some spont movement but not f/c CV: irir afib  PULM:  MV supported, diffuse rhonchi L> R  GI: soft, bs+, ND, foley-cyu Extremities: warm/dry, no LE edema  Skin: no rashes  UOP 1.1L/ 24hrs Net +500 ml Labs reviewed > K 3.7, sCr 0.99, mag 2.3, phos 5, WBC 11.1  Resolved Hospital Problem list     Assessment & Plan:   Toxic metabolic encephalopathy due to ETOH withdrawal ETOH abuse  - last drink 7/8; 12 beers daily  - change propofol to precedex, fentany prn/ low dose gtt prn (for better hemodynamics), cont enteral ativan taper - cont thiamine, folate, MVI  - TOC/ cessation counseling as appropriate   Acute hypoxic respiratory failure secondary to above  R/o PNA vs pulmonary edema  P:  - cont full MV support, 4-8cc/kg IBW with goal Pplat <30 and DP<15  - VAP prevention protocol/ PPI - PAD protocol for sedation> propofol, versed, add prn fentanyl - CXR this am  - wean FiO2 as able for SpO2 >92%  - daily SAT & SBT when appropriate - aggressive pulmonary hygiene - prn bronchodilators  - send trach asp and see abx  as below  Fever/ sepsis, suspected aspiration Hypotension, either related to sepsis +/- sedation, lopressor - send trach aspirate and blood cultures.   - UA neg from 7/8.  MRSA PCR neg 7/8 - checking CXR - empiric unasyn - trend WBC/ fever curve - tylenol prn  - LR 250 ml bolus x1.  BP softer/ UOP down yesterday, did well with small fluid bolus, better UOP overnight.  Some intrinsic losses with fever and prior NPO status.  -  prn peripheral NE for MAP goal > 65  Arial fib/flutter, new onset - cardiology following, appreciate input - rates higher this am likely related to fever - cont amio gtt and Eliquis - echo as below  - prn metoprolol if BP tolerates - optimize lytes- K, Mag - fever control as above  History of CAD w/CABG (12/2022 x1 LIMA-LAD), ischemic cardiomyopathy/ HFrEF, HTN, POA - troponin flat - Cardiology following, appreciate input - echo 07/03/23 with EF down from 40-45 to 35-40%, similar inferior sepatal, and apical HK, normal RV, mild MR - cont ASA, Eliquis, and statin.  Plavix stopped now on eliquis.  - holding lopressor with soft pressures.  GDMT as BP allows   Microcytic anemia  - anemia panel c/w IDA.  Given suspected infection, defer IV iron infusion for now.  H/H stable - trend CBC, transfuse for Hgb < 7  At risk for AKI - UOP better after small fluid bolus yesterday.  Additional today  - trend renal indices  - strict I/Os, daily wts - avoid nephrotoxins, renal dose meds, hemodynamic support as above  Best Practice (right click and "Reselect all SmartList Selections" daily)   Diet/type: NPO; TF per RD DVT prophylaxis: DOAC GI prophylaxis: PPI Lines: N/A Foley:  Yes, and it is still needed Code Status:  full code Last date of multidisciplinary goals of care discussion [pending]  Pending 7/11  Labs   CBC: Recent Labs  Lab 07/02/23 1449 07/03/23 0455 07/04/23 0225 07/05/23 0645  WBC 8.1 6.4 8.6 11.1*  NEUTROABS 6.9  --   --   --   HGB 11.7* 9.8* 10.8* 11.5*  HCT 40.7 33.3* 35.7* 39.3  MCV 75.5* 74.5* 74.8* 73.9*  PLT 299 229 237 219    Basic Metabolic Panel: Recent Labs  Lab 07/02/23 1449 07/03/23 0455 07/04/23 0225 07/04/23 1144 07/04/23 1711  NA 134* 136 137  --   --   K 3.8 3.8 3.6  --   --   CL 99 104 103  --   --   CO2 26 23 24   --   --   GLUCOSE 107* 108* 86  --   --   BUN 13 11 8   --   --   CREATININE 0.84 0.75 1.00  --   --   CALCIUM 9.3  8.7* 8.8*  --   --   MG 2.1  --   --  2.1 2.3  PHOS  --   --   --   --  4.8*   GFR: Estimated Creatinine Clearance: 81.9 mL/min (by C-G formula based on SCr of 1 mg/dL). Recent Labs  Lab 07/02/23 1449 07/03/23 0455 07/04/23 0225 07/05/23 0645  WBC 8.1 6.4 8.6 11.1*    Liver Function Tests: Recent Labs  Lab 07/02/23 1449  AST 28  ALT 30  ALKPHOS 62  BILITOT 1.3*  PROT 8.3*  ALBUMIN 4.0   Recent Labs  Lab 07/02/23 1449  LIPASE 47   No results for input(s): "AMMONIA" in  the last 168 hours.  ABG    Component Value Date/Time   PHART 7.47 (H) 07/03/2023 0501   PCO2ART 37 07/03/2023 0501   PO2ART 109 (H) 07/03/2023 0501   HCO3 27.0 07/03/2023 0501   TCO2 25 01/23/2023 2024   ACIDBASEDEF 1.0 01/23/2023 2024   O2SAT 96 07/03/2023 0501     Coagulation Profile: No results for input(s): "INR", "PROTIME" in the last 168 hours.  Cardiac Enzymes: No results for input(s): "CKTOTAL", "CKMB", "CKMBINDEX", "TROPONINI" in the last 168 hours.  HbA1C: Hgb A1c MFr Bld  Date/Time Value Ref Range Status  01/19/2023 08:11 PM 5.4 4.8 - 5.6 % Final    Comment:    (NOTE) Pre diabetes:          5.7%-6.4%  Diabetes:              >6.4%  Glycemic control for   <7.0% adults with diabetes     CBG: Recent Labs  Lab 07/04/23 1611 07/04/23 1947 07/04/23 2351 07/05/23 0318 07/05/23 0727  GLUCAP 73 82 113* 86 89    Critical care time:       CRITICAL CARE Performed by: Posey Boyer   Total critical care time: 36 minutes  Critical care time was exclusive of separately billable procedures and treating other patients.  Critical care was necessary to treat or prevent imminent or life-threatening deterioration.  Critical care was time spent personally by me on the following activities: development of treatment plan with patient and/or surrogate as well as nursing, discussions with consultants, evaluation of patient's response to treatment, examination of patient,  obtaining history from patient or surrogate, ordering and performing treatments and interventions, ordering and review of laboratory studies, ordering and review of radiographic studies, pulse oximetry and re-evaluation of patient's condition.     Posey Boyer, MSN, NP, AG-ACNP-BC [facility] 07/05/2023, 7:40 AM  See Amion for pager If no response to pager , please call [phone] until 7pm After 7:00 pm call Elink  [phone]

## 2023-07-05 NOTE — Progress Notes (Signed)
Rounding Note    Patient Name: Raymond Roberson Date of Encounter: 07/05/2023   [facility] Cardiologist: Marjo Bicker, MD   Subjective   Currently intubated and sedated. Had hypotension from low-dose oral metoprolol, this has been discontinued.  Continues to struggle with agitation in the setting of alcohol withdrawal.  Inpatient Medications    Scheduled Meds:  apixaban  5 mg Per Tube BID   aspirin  81 mg Per Tube Daily   atorvastatin  80 mg Per Tube QHS   Chlorhexidine Gluconate Cloth  6 each Topical Daily   famotidine  20 mg Per Tube BID   folic acid  1 mg Per Tube Daily   HYDROcodone-acetaminophen  1 tablet Per Tube QID   LORazepam  1 mg Per Tube Q6H   Followed by   [name] ON 07/06/2023] LORazepam  1 mg Per Tube Q8H   multivitamin with minerals  1 tablet Per Tube Daily   mouth rinse  15 mL Mouth Rinse Q2H   pantoprazole (PROTONIX) IV  40 mg Intravenous Daily   thiamine  100 mg Per Tube Daily   Continuous Infusions:  sodium chloride     amiodarone 30 mg/hr (07/05/23 0900)   cefTRIAXone (ROCEPHIN)  IV     dexmedetomidine (PRECEDEX) IV infusion 1.2 mcg/kg/hr (07/05/23 0900)   feeding supplement (VITAL AF 1.2 CAL) Stopped (07/05/23 0755)   fentaNYL infusion INTRAVENOUS     norepinephrine (LEVOPHED) Adult infusion     PRN Meds: acetaminophen **OR** acetaminophen, fentaNYL, LORazepam, metoprolol tartrate, midazolam, mouth rinse, mouth rinse, polyethylene glycol   Vital Signs    Vitals:   07/05/23 0830 07/05/23 0845 07/05/23 0900 07/05/23 0915  BP: (!) 83/61 (!) 88/61 93/75 97/67   Pulse: 93 99 (!) 111 100  Resp: 19 16 (!) 21 19  Temp:      TempSrc:      SpO2: 97% 98% 99% 99%  Weight:      Height:        Intake/Output Summary (Last 24 hours) at 07/05/2023 0953 Last data filed at 07/05/2023 0900 Gross per 24 hour  Intake 2081.72 ml  Output 1245 ml  Net 836.72 ml      07/05/2023    4:21 AM 07/03/2023    7:00 PM 07/03/2023    5:05 AM  Last 3  Weights  Weight (lbs) 186 lb 15.2 oz 187 lb 9.8 oz 192 lb 7.4 oz  Weight (kg) 84.8 kg 85.1 kg 87.3 kg      Telemetry    Atrial flutter, rates 126 fixed  - Personally Reviewed  ECG    No new  Physical Exam   GEN: intubated/sedated   Neck: No JVD Cardiac: regular and tachycardic Respiratory: Clear on vent GI: Soft, nontender, non-distended  MS: No edema; No deformity. Cool extremities but 3/4 DP pulse Neuro:  sedated Psych: unable to assess, intubated and sedated.   Labs    High Sensitivity Troponin:   Recent Labs  Lab 07/02/23 1449 07/02/23 1629  TROPONINIHS 25* 25*     Chemistry Recent Labs  Lab 07/02/23 1449 07/03/23 0455 07/04/23 0225 07/04/23 1144 07/04/23 1711 07/05/23 0645  NA 134* 136 137  --   --  135  K 3.8 3.8 3.6  --   --  3.7  CL 99 104 103  --   --  100  CO2 26 23 24   --   --  20*  GLUCOSE 107* 108* 86  --   --  79  BUN 13 11 8   --   --  11  CREATININE 0.84 0.75 1.00  --   --  0.99  CALCIUM 9.3 8.7* 8.8*  --   --  8.5*  MG 2.1  --   --  2.1 2.3 2.3  PROT 8.3*  --   --   --   --   --   ALBUMIN 4.0  --   --   --   --  2.9*  AST 28  --   --   --   --   --   ALT 30  --   --   --   --   --   ALKPHOS 62  --   --   --   --   --   BILITOT 1.3*  --   --   --   --   --   GFRNONAA >60 >60 >60  --   --  >60  ANIONGAP 9 9 10   --   --  15    Lipids  Recent Labs  Lab 07/03/23 0455  TRIG 120    Hematology Recent Labs  Lab 07/03/23 0455 07/04/23 0225 07/05/23 0645  WBC 6.4 8.6 11.1*  RBC 4.47 4.77 5.32  5.33  HGB 9.8* 10.8* 11.5*  HCT 33.3* 35.7* 39.3  MCV 74.5* 74.8* 73.9*  MCH 21.9* 22.6* 21.6*  MCHC 29.4* 30.3 29.3*  RDW 16.1* 16.4* 16.6*  PLT 229 237 219   Thyroid No results for input(s): "TSH", "FREET4" in the last 168 hours.  BNPNo results for input(s): "BNP", "PROBNP" in the last 168 hours.  DDimer No results for input(s): "DDIMER" in the last 168 hours.   Radiology    DG Abd Portable 1V  Result Date: 07/03/2023 CLINICAL  DATA:  Orogastric tube placement. EXAM: PORTABLE ABDOMEN - 1 VIEW COMPARISON:  None Available. FINDINGS: The bowel gas pattern is normal. Distal tip of the nasogastric tube is seen in expected position of proximal stomach. No radio-opaque calculi or other significant radiographic abnormality are seen. IMPRESSION: Distal tip of nasogastric tube seen in expected position of proximal stomach. Electronically Signed   By: Lupita Raider M.D.   On: 07/03/2023 13:40   ECHOCARDIOGRAM LIMITED  Result Date: 07/03/2023    ECHOCARDIOGRAM LIMITED REPORT   Patient Name:   [name] Date of Exam: 07/03/2023 Medical Rec #:  [id number]        Height:       72.0 in Accession #:    [id number]       Weight:       192.5 lb Date of Birth:  1956/12/08       BSA:          2.096 m Patient Age:    67 years         BP:           106/67 mmHg Patient Gender: M                HR:           63 bpm. [facility] Procedure: Limited Echo, Limited Color Doppler, Cardiac Doppler and Intracardiac            Opacification Agent Indications:    Atrial Flutter I48.92  History:        Patient has prior history of Echocardiogram examinations, most                 recent  03/07/2023. CAD, Prior CABG, Alcohol withdraw, Aortic                 Valve Disease, Arrythmias:Atrial Flutter; Risk                 Factors:Hypertension, Sleep Apnea, Diabetes and Dyslipidemia.                 Aortic Valve: 27 mm Edwards valve is present in the aortic                 position. Procedure Date: 01/23/23.  Sonographer:    Aron Baba Referring Phys: [id number] Ellsworth Lennox  Sonographer Comments: Echo performed with patient supine and on artificial respirator. IMPRESSIONS  1. Inferior sepatal and apical hypokinesis . Left ventricular ejection fraction, by estimation, is 35 to 40%. The left ventricle has moderately decreased function. The left ventricle demonstrates regional wall motion abnormalities (see scoring diagram/findings for description). The  left ventricular internal cavity size was mildly dilated. Left ventricular diastolic parameters are indeterminate.  2. Right ventricular systolic function is normal. The right ventricular size is normal.  3. Left atrial size was moderately dilated.  4. The mitral valve is abnormal. Mild mitral valve regurgitation. No evidence of mitral stenosis.  5. No PVL normal gradients for valve Peak gradient only 10.8 mmHg mean 7.4 mmHg . The aortic valve has been repaired/replaced. Aortic valve regurgitation is not visualized. No aortic stenosis is present. There is a 27 mm Edwards valve present in the aortic position. Procedure Date: 01/23/23.  6. The inferior vena cava is dilated in size with >50% respiratory variability, suggesting right atrial pressure of 8 mmHg. FINDINGS  Left Ventricle: Inferior sepatal and apical hypokinesis. Left ventricular ejection fraction, by estimation, is 35 to 40%. The left ventricle has moderately decreased function. The left ventricle demonstrates regional wall motion abnormalities. Definity contrast agent was given IV to delineate the left ventricular endocardial borders. The left ventricular internal cavity size was mildly dilated. There is no left ventricular hypertrophy. Left ventricular diastolic parameters are indeterminate. Right Ventricle: The right ventricular size is normal. No increase in right ventricular wall thickness. Right ventricular systolic function is normal. Left Atrium: Left atrial size was moderately dilated. Right Atrium: Right atrial size was normal in size. Pericardium: There is no evidence of pericardial effusion. Mitral Valve: The mitral valve is abnormal. There is mild thickening of the mitral valve leaflet(s). There is mild calcification of the mitral valve leaflet(s). Mild mitral valve regurgitation. No evidence of mitral valve stenosis. Tricuspid Valve: The tricuspid valve is normal in structure. Tricuspid valve regurgitation is not demonstrated. No evidence of  tricuspid stenosis. Aortic Valve: No PVL normal gradients for valve Peak gradient only 10.8 mmHg mean 7.4 mmHg. The aortic valve has been repaired/replaced. Aortic valve regurgitation is not visualized. No aortic stenosis is present. There is a 27 mm Edwards valve present in the aortic position. Procedure Date: 01/23/23. Pulmonic Valve: The pulmonic valve was normal in structure. Pulmonic valve regurgitation is not visualized. No evidence of pulmonic stenosis. Aorta: The aortic root is normal in size and structure. Venous: The inferior vena cava is dilated in size with greater than 50% respiratory variability, suggesting right atrial pressure of 8 mmHg. IAS/Shunts: No atrial level shunt detected by color flow Doppler. Additional Comments: Spectral Doppler performed. Color Doppler performed.  LEFT VENTRICLE PLAX 2D LVIDd:         5.10 cm LVIDs:         4.60 cm  LV PW:         1.40 cm LV IVS:        1.10 cm  LEFT ATRIUM         Index LA diam:    4.80 cm 2.29 cm/m  TRICUSPID VALVE TR Peak grad:   23.2 mmHg TR Vmax:        241.00 cm/s Charlton Haws MD Electronically signed by Charlton Haws MD Signature Date/Time: 07/03/2023/10:47:24 AM    Final     Cardiac Studies   Echocardiogram: 02/2023 IMPRESSIONS     1. Left ventricular ejection fraction, by estimation, is 40 to 45%. The  left ventricle has mildly decreased function. The left ventricle  demonstrates regional wall motion abnormalities (see scoring  diagram/findings for description). There is mild left  ventricular hypertrophy. Left ventricular diastolic parameters are  consistent with Grade I diastolic dysfunction (impaired relaxation).   2. Right ventricular systolic function is normal. The right ventricular  size is normal.   3. The mitral valve is normal in structure. Trivial mitral valve  regurgitation. No evidence of mitral stenosis.   4. The aortic valve has been repaired/replaced. Aortic valve  regurgitation is not visualized. No aortic stenosis  is present. There is a  bioprosthetic valve present in the aortic position.   5. The inferior vena cava is normal in size with greater than 50%  respiratory variability, suggesting right atrial pressure of 3 mmHg.   Patient Profile     67 y.o. male w/ PMH of CAD (s/p CABG with LIMA-LAD in 12/2022 with known CTO of RCA with collaterals, severe AS (s/p SAVR in 12/2022), HTN, HLD and alcohol abuse who is currently admitted for alcohol withdrawal. Cardiology consulted due to newly diagnosed atrial flutter with RVR.   Assessment & Plan     1. Atrial Flutter with RVR - New diagnosis for the patient this admission but occurring in the setting of alcohol withdrawal as he was consuming 12+ beers daily by review of notes.  - Was started on IV Amiodarone and rates have improved. Would continue with IV Amiodarone. Also started on Eliquis 5mg  BID (per tube). Pending his clinical course, could consider TEE-guided DCCV later this admission. At this time, at very-high risk for recurrence given his alcohol withdrawal.  - rates seem to follow sedation needs. Rates 60s today. Continue current care. - lopressor held for hypotension.  2. CAD - He underwent CABG x1 in 12/2022 with LIMA-LAD and has a known CTO of the RCA. Hs Troponin flat at 25 this admission. Repeat echo pending. Remains on ASA, Atorvastatin. Plavix was stopped yesterday given the indication for anticoagulation.   3. Severe AS s/p AVR - He is s/p AVR in 12/2022 with 27 mm Edwards Inspiris Resilia. Echo with stable valve function.  4. HFmrEF - His EF was at 30-35% in 12/2022, improved to 40-45% by repeat echo in 02/2023. EF mildly reduced again, may be in setting of rhythm. Not currently volume overloaded. PTA Losartan and Spironolactone currently held given soft BP. BB held. Would restart GDMT as BP allows.   5. Alcohol Withdrawal - Initially was started on Ativan and Precedex and ultimately required intubation due to worsening agitation and  unresponsiveness. Currently on Propofol.   6. Anemia - Baseline Hgb 10 - 11. At 11.7 on admission 11.5 today monitor serially.  For questions or updates, please contact [facility] Please consult [email or web address] for contact info under        Signed, Parke Poisson,  MD  07/05/2023, 9:53 AM    CRITICAL CARE Performed by: Weston Brass, MD   Total critical care time: 30 minutes   Critical care time was exclusive of separately billable procedures and treating other patients.   Critical care was necessary to treat or prevent imminent or life-threatening deterioration.   Critical care was time spent personally by me (independent of APPs or residents) on the following activities: development of treatment plan with patient and/or surrogate as well as nursing, discussions with consultants, evaluation of patient's response to treatment, examination of patient, obtaining history from patient or surrogate, ordering and performing treatments and interventions, ordering and review of laboratory studies, ordering and review of radiographic studies, pulse oximetry and re-evaluation of patient's condition.

## 2023-07-05 NOTE — Progress Notes (Signed)
Pharmacy Antibiotic Note  Raymond Roberson is a 67 y.o. male admitted on 07/02/2023 with  concerns of bacteremia and pneumonia .  Pharmacy has been consulted for ampicilin/sulbactam dosing. Pt is febrile (100.2 F, 101.0 F) but hemodynamically stable (HR 60-120, MAP 70-90). Renal function is stable (Scr 0.99, CrCl 82.7 mL/min), ampicillin/sulbactam does not require any renal adjustments at this time.  Plan: After discussion with Dr. Tonia Brooms, antibiotics will be changed from ceftriaxone to ampicillin/sulbactam. Pt will receive ampicllin/sulbactam 3000 mg every 6 hours. Will continue to monitor vitals, renal function, and culture results.  Height: 6' (182.9 cm) Weight: 84.8 kg (186 lb 15.2 oz) IBW/kg (Calculated) : 77.6  Temp (24hrs), Avg:100.2 F (37.9 C), Min:98.7 F (37.1 C), Max:101.4 F (38.6 C)  Recent Labs  Lab 07/02/23 1449 07/03/23 0455 07/04/23 0225 07/05/23 0645  WBC 8.1 6.4 8.6 11.1*  CREATININE 0.84 0.75 1.00 0.99    Estimated Creatinine Clearance: 82.7 mL/min (by C-G formula based on SCr of 0.99 mg/dL).    Allergies  Allergen Reactions   Naproxen Nausea Only   Sulfa Antibiotics Nausea Only    Antimicrobials this admission: Ceftriaxone 7/11 x1 Ampicillin/sulbactam 7/11 >>  Microbiology results: 7/11 BCx x2: in process 7/11 Sputum: in process  7/8 MRSA PCR: not detected  Thank you for allowing pharmacy to be a part of this patient's care.  Wilmer Floor 07/05/2023 11:33 AM

## 2023-07-06 ENCOUNTER — Ambulatory Visit: Payer: Medicare HMO | Admitting: Nurse Practitioner

## 2023-07-06 DIAGNOSIS — I4892 Unspecified atrial flutter: Secondary | ICD-10-CM | POA: Diagnosis not present

## 2023-07-06 DIAGNOSIS — I2510 Atherosclerotic heart disease of native coronary artery without angina pectoris: Secondary | ICD-10-CM | POA: Diagnosis not present

## 2023-07-06 DIAGNOSIS — F10931 Alcohol use, unspecified with withdrawal delirium: Secondary | ICD-10-CM | POA: Diagnosis not present

## 2023-07-06 DIAGNOSIS — I5020 Unspecified systolic (congestive) heart failure: Secondary | ICD-10-CM | POA: Diagnosis not present

## 2023-07-06 DIAGNOSIS — I350 Nonrheumatic aortic (valve) stenosis: Secondary | ICD-10-CM | POA: Diagnosis not present

## 2023-07-06 LAB — CBC
HCT: 36.3 % — ABNORMAL LOW (ref 39.0–52.0)
Hemoglobin: 10.5 g/dL — ABNORMAL LOW (ref 13.0–17.0)
MCH: 21.5 pg — ABNORMAL LOW (ref 26.0–34.0)
MCHC: 28.9 g/dL — ABNORMAL LOW (ref 30.0–36.0)
MCV: 74.4 fL — ABNORMAL LOW (ref 80.0–100.0)
Platelets: 225 10*3/uL (ref 150–400)
RBC: 4.88 MIL/uL (ref 4.22–5.81)
RDW: 16.4 % — ABNORMAL HIGH (ref 11.5–15.5)
WBC: 10.7 10*3/uL — ABNORMAL HIGH (ref 4.0–10.5)
nRBC: 0 % (ref 0.0–0.2)

## 2023-07-06 LAB — GLUCOSE, CAPILLARY
Glucose-Capillary: 118 mg/dL — ABNORMAL HIGH (ref 70–99)
Glucose-Capillary: 110 mg/dL — ABNORMAL HIGH (ref 70–99)
Glucose-Capillary: 105 mg/dL — ABNORMAL HIGH (ref 70–99)
Glucose-Capillary: 100 mg/dL — ABNORMAL HIGH (ref 70–99)
Glucose-Capillary: 110 mg/dL — ABNORMAL HIGH (ref 70–99)

## 2023-07-06 LAB — RENAL FUNCTION PANEL
Albumin: 2.5 g/dL — ABNORMAL LOW (ref 3.5–5.0)
Anion gap: 9 (ref 5–15)
BUN: 11 mg/dL (ref 8–23)
CO2: 23 mmol/L (ref 22–32)
Calcium: 8.3 mg/dL — ABNORMAL LOW (ref 8.9–10.3)
Chloride: 102 mmol/L (ref 98–111)
Creatinine, Ser: 0.88 mg/dL (ref 0.61–1.24)
GFR, Estimated: >60 mL/min (ref >60)
Glucose, Bld: 125 mg/dL — ABNORMAL HIGH (ref 70–99)
Phosphorus: 3.2 mg/dL (ref 2.5–4.6)
Potassium: 3.9 mmol/L (ref 3.5–5.1)
Sodium: 134 mmol/L — ABNORMAL LOW (ref 135–145)

## 2023-07-06 LAB — PHOSPHORUS: Phosphorus: 3 mg/dL (ref 2.5–4.6)

## 2023-07-06 LAB — MAGNESIUM: Magnesium: 2.3 mg/dL (ref 1.7–2.4)

## 2023-07-06 LAB — CULTURE, RESPIRATORY W GRAM STAIN

## 2023-07-06 MED ORDER — PHENOBARBITAL SODIUM 130 MG/ML IJ SOLN
97.5000 mg | Freq: Three times a day (TID) | INTRAMUSCULAR | Status: AC
  Administered 2023-07-06 – 2023-07-07 (×4): 97.5 mg via INTRAVENOUS
  Filled 2023-07-06 (×4): qty 1

## 2023-07-06 MED ORDER — PHENOBARBITAL 32.4 MG PO TABS: 32.4000 mg | ORAL_TABLET | Freq: Three times a day (TID) | ORAL | Status: DC

## 2023-07-06 MED ORDER — BISACODYL 10 MG RE SUPP: 10.0000 mg | Freq: Every day | RECTAL | Status: DC | PRN

## 2023-07-06 MED ORDER — ENOXAPARIN SODIUM 80 MG/0.8ML IJ SOSY
80.0000 mg | PREFILLED_SYRINGE | Freq: Two times a day (BID) | INTRAMUSCULAR | Status: DC
  Administered 2023-07-06 – 2023-07-12 (×12): 80 mg via SUBCUTANEOUS
  Filled 2023-07-06 (×15): qty 0.8

## 2023-07-06 MED ORDER — POLYETHYLENE GLYCOL 3350 17 G PO PACK: 17.0000 g | PACK | Freq: Every day | ORAL | Status: DC

## 2023-07-06 MED ORDER — POLYETHYLENE GLYCOL 3350 17 G PO PACK
17.0000 g | PACK | Freq: Two times a day (BID) | ORAL | Status: DC
  Administered 2023-07-06: 17 g
  Filled 2023-07-06: qty 1

## 2023-07-06 MED ORDER — POTASSIUM CHLORIDE 20 MEQ PO PACK
20.0000 meq | PACK | Freq: Once | ORAL | Status: AC
  Administered 2023-07-06: 20 meq
  Filled 2023-07-06: qty 1

## 2023-07-06 MED ORDER — SENNOSIDES 8.8 MG/5ML PO SYRP: 10.0000 mL | ORAL_SOLUTION | Freq: Every day | ORAL | Status: DC

## 2023-07-06 MED ORDER — SENNOSIDES 8.8 MG/5ML PO SYRP: 10.0000 mL | ORAL_SOLUTION | Freq: Two times a day (BID) | ORAL | Status: DC

## 2023-07-06 MED ORDER — PHENOBARBITAL 64.8 MG PO TABS: 64.8000 mg | ORAL_TABLET | Freq: Three times a day (TID) | ORAL | Status: DC

## 2023-07-06 MED ORDER — PHENOBARBITAL SODIUM 65 MG/ML IJ SOLN
65.0000 mg | Freq: Three times a day (TID) | INTRAMUSCULAR | Status: AC
  Administered 2023-07-08 – 2023-07-10 (×6): 65 mg via INTRAVENOUS
  Filled 2023-07-06 (×6): qty 1

## 2023-07-06 MED ORDER — HYDROCODONE-ACETAMINOPHEN 5-325 MG PO TABS: 1.0000 | ORAL_TABLET | Freq: Four times a day (QID) | ORAL | Status: DC | PRN

## 2023-07-06 MED ORDER — PHENOBARBITAL 97.2 MG PO TABS
97.2000 mg | ORAL_TABLET | Freq: Three times a day (TID) | ORAL | Status: DC
  Administered 2023-07-06: 97.2 mg
  Filled 2023-07-06: qty 3

## 2023-07-06 MED ORDER — LORAZEPAM 2 MG/ML IJ SOLN
1.0000 mg | INTRAMUSCULAR | Status: DC | PRN
  Administered 2023-07-07 – 2023-07-09 (×5): 2 mg via INTRAVENOUS
  Filled 2023-07-06 (×5): qty 1

## 2023-07-06 MED ORDER — FUROSEMIDE 10 MG/ML IJ SOLN
20.0000 mg | Freq: Once | INTRAMUSCULAR | Status: AC
  Administered 2023-07-06: 20 mg via INTRAVENOUS
  Filled 2023-07-06: qty 2

## 2023-07-06 MED ORDER — IPRATROPIUM-ALBUTEROL 0.5-2.5 (3) MG/3ML IN SOLN: 3.0000 mL | RESPIRATORY_TRACT | Status: DC | PRN

## 2023-07-06 MED ORDER — DOCUSATE SODIUM 50 MG/5ML PO LIQD
100.0000 mg | Freq: Two times a day (BID) | ORAL | Status: DC
  Administered 2023-07-06: 100 mg
  Filled 2023-07-06: qty 10

## 2023-07-06 MED ORDER — PHENOBARBITAL SODIUM 130 MG/ML IJ SOLN
130.0000 mg | Freq: Once | INTRAMUSCULAR | Status: AC
  Administered 2023-07-06: 130 mg via INTRAVENOUS
  Filled 2023-07-06: qty 1

## 2023-07-06 MED ORDER — PHENOBARBITAL SODIUM 65 MG/ML IJ SOLN
32.5000 mg | Freq: Three times a day (TID) | INTRAMUSCULAR | Status: AC
  Administered 2023-07-10 – 2023-07-11 (×6): 32.5 mg via INTRAVENOUS
  Filled 2023-07-06 (×6): qty 1

## 2023-07-06 NOTE — Progress Notes (Signed)
Patient currently IVC'd. TOC will continue to follow.

## 2023-07-06 NOTE — Progress Notes (Signed)
Pt placed on PS/CPAP 5/5 on 30% and is tolerating well. RT will monitor. 

## 2023-07-06 NOTE — Progress Notes (Signed)
eLink Physician-Brief Progress Note Patient Name: Raymond Roberson DOB: 02/04/1956 MRN: [id number]   Date of Service  07/06/2023  HPI/Events of Note  Patient extubated today. No enteral access  eICU Interventions  -Discussed with pharmacy team. -PO phenobarb to IV for alcohol withdrawal protocol -Eliquis changed to treatment dosed lovenox for afib   1:48 AM Intermittent agitation remains an issue. Calm on camera check but intermittently restless. Will increase precedex ceiling to 1.8 max. Discussed with bedside RN for vital monitoring for bradycardia and hypotension.   Intervention Category Minor Interventions: Routine modifications to care plan (e.g. PRN medications for pain, fever)  Whitney Hillegass Mechele Collin 07/06/2023, 10:20 PM

## 2023-07-06 NOTE — Progress Notes (Signed)
Pt placed on HHFNC 40L 40% for work of breathing. Pt mouth breathing and slightly labored, but lung fields clear.

## 2023-07-06 NOTE — Progress Notes (Signed)
Rounding Note    Patient Name: Raymond Roberson Date of Encounter: 07/06/2023  [facility] Cardiologist: Marjo Bicker, MD   Subjective   Currently intubated and sedated. Had hypotension from low-dose oral metoprolol, this has been discontinued.  Continues to struggle with agitation in the setting of alcohol withdrawal. Plan for possible extubation today.   Inpatient Medications    Scheduled Meds:  apixaban  5 mg Per Tube BID   aspirin  81 mg Per Tube Daily   atorvastatin  80 mg Per Tube QHS   Chlorhexidine Gluconate Cloth  6 each Topical Daily   docusate  100 mg Per Tube BID   famotidine  20 mg Per Tube BID   folic acid  1 mg Per Tube Daily   HYDROcodone-acetaminophen  1 tablet Per Tube QID   multivitamin with minerals  1 tablet Per Tube Daily   mouth rinse  15 mL Mouth Rinse Q2H   pantoprazole (PROTONIX) IV  40 mg Intravenous Daily   phenobarbital  97.2 mg Per Tube Q8H   Followed by   [START ON 07/08/2023] phenobarbital  64.8 mg Per Tube Q8H   Followed by   [START ON 07/10/2023] phenobarbital  32.4 mg Per Tube Q8H   polyethylene glycol  17 g Per Tube BID   sennosides  10 mL Per Tube QHS   [START ON 07/08/2023] thiamine  100 mg Per Tube Daily   Continuous Infusions:  sodium chloride 10 mL/hr at 07/06/23 0700   amiodarone 30 mg/hr (07/06/23 0700)   ampicillin-sulbactam (UNASYN) IV Stopped (07/06/23 0543)   dexmedetomidine (PRECEDEX) IV infusion 0.9 mcg/kg/hr (07/06/23 0703)   feeding supplement (VITAL AF 1.2 CAL) 60 mL/hr at 07/06/23 0700   fentaNYL infusion INTRAVENOUS 75 mcg/hr (07/06/23 0700)   norepinephrine (LEVOPHED) Adult infusion 2 mcg/min (07/06/23 0704)   thiamine (VITAMIN B1) injection Stopped (07/05/23 1244)   PRN Meds: acetaminophen **OR** acetaminophen, bisacodyl, fentaNYL, metoprolol tartrate, midazolam, mouth rinse, mouth rinse   Vital Signs    Vitals:   07/06/23 0645 07/06/23 0700 07/06/23 0720 07/06/23 0752  BP: (!) 93/56 91/61     Pulse: (!) 58 (!) 58    Resp: 14 15    Temp:    99.2 F (37.3 C)  TempSrc:    Oral  SpO2: 99% 100% 100%   Weight:      Height:        Intake/Output Summary (Last 24 hours) at 07/06/2023 0841 Last data filed at 07/06/2023 0700 Gross per 24 hour  Intake 3560.54 ml  Output 1200 ml  Net 2360.54 ml      07/06/2023    4:41 AM 07/05/2023    4:21 AM 07/03/2023    7:00 PM  Last 3 Weights  Weight (lbs) 186 lb 8.2 oz 186 lb 15.2 oz 187 lb 9.8 oz  Weight (kg) 84.6 kg 84.8 kg 85.1 kg      Telemetry    Atrial flutter, rates 59 fixed  - Personally Reviewed  ECG    No new  Physical Exam   GEN: intubated/sedated   Neck: No JVD Cardiac: regular and tachycardic Respiratory: Clear on vent GI: Soft, nontender, non-distended  MS: No edema; No deformity. Cool extremities but 3/4 DP pulse Neuro:  sedated Psych: unable to assess, intubated and sedated.  Labs    High Sensitivity Troponin:   Recent Labs  Lab 07/02/23 1449 07/02/23 1629  TROPONINIHS 25* 25*     Chemistry Recent Labs  Lab 07/02/23 1449  07/03/23 0455 07/04/23 0225 07/04/23 1144 07/04/23 1711 07/05/23 0645 07/05/23 1737 07/06/23 0732  NA 134*   < > 137  --   --  135  --  134*  K 3.8   < > 3.6  --   --  3.7  --  3.9  CL 99   < > 103  --   --  100  --  102  CO2 26   < > 24  --   --  20*  --  23  GLUCOSE 107*   < > 86  --   --  79  --  125*  BUN 13   < > 8  --   --  11  --  11  CREATININE 0.84   < > 1.00  --   --  0.99  --  0.88  CALCIUM 9.3   < > 8.8*  --   --  8.5*  --  8.3*  MG 2.1  --   --    < > 2.3 2.3 2.1  --   PROT 8.3*  --   --   --   --   --   --   --   ALBUMIN 4.0  --   --   --   --  2.9*  --  2.5*  AST 28  --   --   --   --   --   --   --   ALT 30  --   --   --   --   --   --   --   ALKPHOS 62  --   --   --   --   --   --   --   BILITOT 1.3*  --   --   --   --   --   --   --   GFRNONAA >60   < > >60  --   --  >60  --  >60  ANIONGAP 9   < > 10  --   --  15  --  9   < > = values in this interval  not displayed.    Lipids  Recent Labs  Lab 07/03/23 0455  TRIG 120    Hematology Recent Labs  Lab 07/04/23 0225 07/05/23 0645 07/06/23 0732  WBC 8.6 11.1* 10.7*  RBC 4.77 5.32  5.33 4.88  HGB 10.8* 11.5* 10.5*  HCT 35.7* 39.3 36.3*  MCV 74.8* 73.9* 74.4*  MCH 22.6* 21.6* 21.5*  MCHC 30.3 29.3* 28.9*  RDW 16.4* 16.6* 16.4*  PLT 237 219 225   Thyroid No results for input(s): "TSH", "FREET4" in the last 168 hours.  BNPNo results for input(s): "BNP", "PROBNP" in the last 168 hours.  DDimer No results for input(s): "DDIMER" in the last 168 hours.   Radiology    DG Chest Port 1 View  Result Date: 07/05/2023 CLINICAL DATA:  Respiratory failure, status post endotracheal tube placement EXAM: PORTABLE CHEST 1 VIEW COMPARISON:  07/03/2023 FINDINGS: Endotracheal tube and gastric catheter are noted in satisfactory position. Cardiac shadow remains enlarged. Postsurgical changes are again seen. Lungs are well aerated bilaterally. Some increasing basilar opacities are seen left slightly greater than right. No sizable effusion is noted. Old rib fractures are seen on the right. IMPRESSION: Increasing basilar airspace opacity left greater than right. Tubes and lines as described. Electronically Signed   By: Alcide Clever M.D.   On: 07/05/2023 11:45  Cardiac Studies   Echocardiogram: 02/2023 IMPRESSIONS     1. Left ventricular ejection fraction, by estimation, is 40 to 45%. The  left ventricle has mildly decreased function. The left ventricle  demonstrates regional wall motion abnormalities (see scoring  diagram/findings for description). There is mild left  ventricular hypertrophy. Left ventricular diastolic parameters are  consistent with Grade I diastolic dysfunction (impaired relaxation).   2. Right ventricular systolic function is normal. The right ventricular  size is normal.   3. The mitral valve is normal in structure. Trivial mitral valve  regurgitation. No evidence of mitral  stenosis.   4. The aortic valve has been repaired/replaced. Aortic valve  regurgitation is not visualized. No aortic stenosis is present. There is a  bioprosthetic valve present in the aortic position.   5. The inferior vena cava is normal in size with greater than 50%  respiratory variability, suggesting right atrial pressure of 3 mmHg.   Patient Profile     67 y.o. male w/ PMH of CAD (s/p CABG with LIMA-LAD in 12/2022 with known CTO of RCA with collaterals, severe AS (s/p SAVR in 12/2022), HTN, HLD and alcohol abuse who is currently admitted for alcohol withdrawal. Cardiology consulted due to newly diagnosed atrial flutter with RVR.   Assessment & Plan    1. Atrial Flutter with RVR - New diagnosis for the patient this admission but occurring in the setting of alcohol withdrawal as he was consuming 12+ beers daily by review of notes.  - continue with IV Amiodarone. Also started on Eliquis 5mg  BID (per tube). Pending his clinical course, could consider TEE-guided DCCV later this admission. At this time, at high risk for recurrence given his alcohol withdrawal.  - rates seem to follow sedation needs. Rates 60 today. Continue current care. - lopressor held for hypotension.  2. CAD - He underwent CABG x1 in 12/2022 with LIMA-LAD and has a known CTO of the RCA. Hs Troponin flat at 25 this admission. Remains on ASA, Atorvastatin. Plavix was stopped given the indication for anticoagulation.   3. Severe AS s/p AVR - He is s/p AVR in 12/2022 with 27 mm Edwards Inspiris Resilia. Echo with stable valve function.  4. HFmrEF - His EF was at 30-35% in 12/2022, improved to 40-45% by repeat echo in 02/2023. EF mildly reduced again, may be in setting of rhythm. Not currently volume overloaded, but net positive on fluids. PTA Losartan and Spironolactone currently held given soft BP. BB held. Would restart GDMT as BP allows.  - net positive 2.8 L, consider initiating diuresis or minimizing IV fluids  5.  Alcohol Withdrawal - Initially was started on Ativan and Precedex and ultimately required intubation due to worsening agitation and unresponsiveness. Currently on Propofol.   6. Anemia - Baseline Hgb 10 - 11. At 11.7 on admission 10.5 today monitor serially.  For questions or updates, please contact [facility] Please consult [email or web address] for contact info under        Signed, Parke Poisson, MD  07/06/2023, 8:41 AM    CRITICAL CARE Performed by: Weston Brass, MD   Total critical care time: 30 minutes   Critical care time was exclusive of separately billable procedures and treating other patients.   Critical care was necessary to treat or prevent imminent or life-threatening deterioration.   Critical care was time spent personally by me (independent of APPs or residents) on the following activities: development of treatment plan with patient and/or surrogate as well as nursing, discussions  with consultants, evaluation of patient's response to treatment, examination of patient, obtaining history from patient or surrogate, ordering and performing treatments and interventions, ordering and review of laboratory studies, ordering and review of radiographic studies, pulse oximetry and re-evaluation of patient's condition.

## 2023-07-06 NOTE — Progress Notes (Addendum)
Pharmacy ICU Bowel Regimen Consult Note   Current Inpatient Medications for Bowel Management:  Miralax PRN  Assessment: Raymond Roberson is a 67 y.o. year old male admitted on 07/02/2023. 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 Bowel regimen assessment completed by [facility] on 7/12. LBM on PTA.  [x]  Hypoactive Bowel sounds present  []  No abdominal tenderness  []  Passing gas   Plan: Start docusate 100 mg BID, sennosides 10 ml at bedtime, Miralax 17g BID.  MD contacted (if needed): Posey Boyer, NP  Thank you for allowing pharmacy to participate in this patient's care.  Reece Leader, Colon Flattery, BCCP Clinical Pharmacist  07/06/2023 8:13 AM   [facility] pharmacy phone numbers are listed on [email or web address]

## 2023-07-06 NOTE — Progress Notes (Signed)
NAME:  Raymond Roberson, MRN:  [id number], DOB:  [birth year], LOS: 4 ADMISSION DATE:  07/02/2023, CONSULTATION DATE: 07/06/23 REFERRING MD:  EDP, CHIEF COMPLAINT:  ETOH withdrawal   History of Present Illness:  Raymond Roberson is a 67 y.o. M with PMH significant for CAD s/p CABG 12/2022, Ischemic cardiomyopathy, HTN, HFrEF and ETOH abuse with reported 12 beers/day who presented to [facility] with two days of dizziness and shortness of breath.  He stopped drinking thinking that this would help (evening of 7/8), and then developed tremulousness and diaphoresis.   In the ED he was found to be in Afib/flutter and was treated with amiodarone.  He became increasingly agitated and required precedex and eventual intubation so was transferred to [facility].  Labs notable for flat troponin 25>25, ethanol <10, ABG 7.52/30/158/25  Pertinent  Medical History   has a past medical history of Aortic stenosis, Bicuspid aortic valve, CAD (coronary artery disease), Chronic alcohol abuse, Classic migraine (09/02/2013), CVA (cerebral vascular accident) (HCC), Diabetes mellitus without complication (HCC), GERD (gastroesophageal reflux disease), Gouty arthritis, Hypertension, Major depression, Mixed hyperlipidemia, Peptic ulcer disease, and Sleep apnea.  Significant Hospital Events: Including procedures, antibiotic start and stop dates in addition to other pertinent events   7/8 presented to [facility] in aflutter and developed worsening ETOH withdrawal requiring intubation 7/9 arrived to [facility] stable on propofol and CIWA 7/11 febrile overnight, cultured, abx started, still intermittently agitated  Interim History / Subjective:  Afebrile Following few intermittent commands but remains intermittently agitated on dex/ fentanyl, responds better to versed than ativan per night team  Objective   Blood pressure 91/61, pulse (!) 58, temperature 99.2 F (37.3 C), temperature source Oral, resp. rate 15, height 6' (1.829 m), weight 84.6 kg, SpO2  100%.    Vent Mode: PRVC FiO2 (%):  [30 %] 30 % Set Rate:  [14 bmp] 14 bmp Vt Set:  [620 mL] 620 mL PEEP:  [5 cmH20] 5 cmH20 Plateau Pressure:  [19 cmH20-20 cmH20] 20 cmH20   Intake/Output Summary (Last 24 hours) at 07/06/2023 0845 Last data filed at 07/06/2023 0700 Gross per 24 hour  Intake 3560.54 ml  Output 1200 ml  Net 2360.54 ml   Filed Weights   07/03/23 1900 07/05/23 0421 07/06/23 0441  Weight: 85.1 kg 84.8 kg 84.6 kg   Dex 0.9, fent 75 General:  critically ill older male in bed in NAD HEENT: MM pink/moist, ETT/ OGT, pinpoint pupils Neuro:  opens eyes to suctioning, wiggled feet but did not follow other commands, at times tries to sit up and pulls against wrist restraints CV: aflutter, rate 50-60s PULM:  MV supported, non labored, slightly coarse, diminished in bases, mild tan secretions GI: soft, bs hypo, NT, foley- yellow Extremities: warm/dry, no LE edema  Skin: no rashes   UOP 1.3L/ 24hrs Net +2.8 ml Labs reviewed K 3.9, sCr 0.88, WBC 10.7  Resolved Hospital Problem list     Assessment & Plan:   Toxic metabolic encephalopathy due to ETOH withdrawal ETOH abuse  - last drink 7/8; 12 beers daily  -  cont precedex, stop ativan taper, restart phenobarb (short course on Eliquis is ok), cont home scheduled Vicodin, prn versed with aggressive bowel regimen - cont thiamine, folate, MVI  - TOC/ cessation counseling as appropriate   Acute hypoxic respiratory failure secondary to above  Aspiration PNA P:  - cont full MV support, 4-8cc/kg IBW with goal Pplat <30 and DP<15  - VAP prevention protocol/ PPI - PAD protocol for sedation>as  above - intermittent CXR/ ABG - SpO2 >92%  - daily SAT & SBT > hopeful to start today - aggressive pulmonary hygiene - prn bronchodilators  - follow trach asp, cont unasyn for now  Sepsis 2/2 aspiration PNA Hypotension, 2/2 sepsis +/- sedation, lopressor - remains on low dose NE, hopeful to stop today, likely related to sedation,  goal MAP > 65 - cont unasyn pending cultures - follow trach and BC - trend WBC/ fever curve - tylenol prn   Arial fib/flutter, new onset - cardiology following, appreciate input - rates controlled on precedex - cont to hold lopressor - cont amio gtt and Eliquis - echo as below  - prn metoprolol if BP tolerates - optimize lytes- K, Mag - fever control as above  History of CAD w/CABG (12/2022 x1 LIMA-LAD), ischemic cardiomyopathy/ HFrEF, HTN, POA - troponin flat - Cardiology following, appreciate input - echo 07/03/23 with EF down from 40-45 to 35-40%, similar inferior sepatal, and apical HK, normal RV, mild MR - cont ASA, Eliquis, and statin.  Plavix stopped now on eliquis.  - holding lopressor while on pressors.  GDMT as BP allows   Microcytic anemia  - anemia panel c/w IDA.  Given suspected infection, defer IV iron infusion for now.  H/H stable - trend CBC, transfuse for Hgb < 7  At risk for AKI - UOP stable, sCr stable - monitor volume status closely - trend renal indices  - strict I/Os, daily wts - avoid nephrotoxins, renal dose meds, hemodynamic support as above  Best Practice (right click and "Reselect all SmartList Selections" daily)   Diet/type: NPO; TF per RD DVT prophylaxis: DOAC GI prophylaxis: PPI Lines: N/A Foley:  Yes, and it is still needed> likely d/c today  Code Status:  full code Last date of multidisciplinary goals of care discussion [pending]  Fiance updated at bedside 7/12.   Labs   CBC: Recent Labs  Lab 07/02/23 1449 07/03/23 0455 07/04/23 0225 07/05/23 0645 07/06/23 0732  WBC 8.1 6.4 8.6 11.1* 10.7*  NEUTROABS 6.9  --   --   --   --   HGB 11.7* 9.8* 10.8* 11.5* 10.5*  HCT 40.7 33.3* 35.7* 39.3 36.3*  MCV 75.5* 74.5* 74.8* 73.9* 74.4*  PLT 299 229 237 219 225    Basic Metabolic Panel: Recent Labs  Lab 07/02/23 1449 07/03/23 0455 07/04/23 0225 07/04/23 1144 07/04/23 1711 07/05/23 0645 07/05/23 1737 07/06/23 0732  NA 134* 136  137  --   --  135  --  134*  K 3.8 3.8 3.6  --   --  3.7  --  3.9  CL 99 104 103  --   --  100  --  102  CO2 26 23 24   --   --  20*  --  23  GLUCOSE 107* 108* 86  --   --  79  --  125*  BUN 13 11 8   --   --  11  --  11  CREATININE 0.84 0.75 1.00  --   --  0.99  --  0.88  CALCIUM 9.3 8.7* 8.8*  --   --  8.5*  --  8.3*  MG 2.1  --   --  2.1 2.3 2.3 2.1  --   PHOS  --   --   --   --  4.8* 5.0*  5.1* 4.3 3.2   GFR: Estimated Creatinine Clearance: 93.1 mL/min (by C-G formula based on SCr of 0.88 mg/dL). Recent Labs  Lab 07/03/23 0455 07/04/23 0225 07/05/23 0645 07/06/23 0732  WBC 6.4 8.6 11.1* 10.7*    Liver Function Tests: Recent Labs  Lab 07/02/23 1449 07/05/23 0645 07/06/23 0732  AST 28  --   --   ALT 30  --   --   ALKPHOS 62  --   --   BILITOT 1.3*  --   --   PROT 8.3*  --   --   ALBUMIN 4.0 2.9* 2.5*   Recent Labs  Lab 07/02/23 1449  LIPASE 47   No results for input(s): "AMMONIA" in the last 168 hours.  ABG    Component Value Date/Time   PHART 7.47 (H) 07/03/2023 0501   PCO2ART 37 07/03/2023 0501   PO2ART 109 (H) 07/03/2023 0501   HCO3 27.0 07/03/2023 0501   TCO2 25 01/23/2023 2024   ACIDBASEDEF 1.0 01/23/2023 2024   O2SAT 96 07/03/2023 0501     Coagulation Profile: No results for input(s): "INR", "PROTIME" in the last 168 hours.  Cardiac Enzymes: No results for input(s): "CKTOTAL", "CKMB", "CKMBINDEX", "TROPONINI" in the last 168 hours.  HbA1C: Hgb A1c MFr Bld  Date/Time Value Ref Range Status  01/19/2023 08:11 PM 5.4 4.8 - 5.6 % Final    Comment:    (NOTE) Pre diabetes:          5.7%-6.4%  Diabetes:              >6.4%  Glycemic control for   <7.0% adults with diabetes     CBG: Recent Labs  Lab 07/05/23 1520 07/05/23 1939 07/05/23 2332 07/06/23 0321 07/06/23 0756  GLUCAP 107* 98 124* 118* 110*    Critical care time:       CRITICAL CARE Performed by: Posey Boyer   Total critical care time: 35 minutes  Critical care  time was exclusive of separately billable procedures and treating other patients.  Critical care was necessary to treat or prevent imminent or life-threatening deterioration.  Critical care was time spent personally by me on the following activities: development of treatment plan with patient and/or surrogate as well as nursing, discussions with consultants, evaluation of patient's response to treatment, examination of patient, obtaining history from patient or surrogate, ordering and performing treatments and interventions, ordering and review of laboratory studies, ordering and review of radiographic studies, pulse oximetry and re-evaluation of patient's condition.     Posey Boyer, MSN, NP, AG-ACNP-BC [facility] 07/06/2023, 8:45 AM  See Amion for pager If no response to pager , please call [phone] until 7pm After 7:00 pm call Elink  [phone]

## 2023-07-06 NOTE — Procedures (Signed)
Extubation Procedure Note  Patient Details:   Name: Raymond Roberson DOB: December 30, 1955 MRN: [id number]   Airway Documentation:    Vent end date: 07/06/23 Vent end time: 1247   Evaluation  O2 sats: stable throughout Complications: No apparent complications Patient did tolerate procedure well. Bilateral Breath Sounds: Clear, Diminished   No  Pt extubated to 4l [place] with RN at bedside. Positive cuff leak noted and pt is tolerating well. Vitals stable. RT will monitor.  Lajuan Lines 07/06/2023, 12:48 PM

## 2023-07-07 ENCOUNTER — Inpatient Hospital Stay (HOSPITAL_COMMUNITY): Payer: Medicare HMO

## 2023-07-07 DIAGNOSIS — I4892 Unspecified atrial flutter: Secondary | ICD-10-CM | POA: Diagnosis not present

## 2023-07-07 DIAGNOSIS — J9601 Acute respiratory failure with hypoxia: Secondary | ICD-10-CM | POA: Diagnosis not present

## 2023-07-07 LAB — CBC
HCT: 30.7 % — ABNORMAL LOW (ref 39.0–52.0)
Hemoglobin: 8.8 g/dL — ABNORMAL LOW (ref 13.0–17.0)
MCH: 21.4 pg — ABNORMAL LOW (ref 26.0–34.0)
MCHC: 28.7 g/dL — ABNORMAL LOW (ref 30.0–36.0)
MCV: 74.5 fL — ABNORMAL LOW (ref 80.0–100.0)
Platelets: 215 10*3/uL (ref 150–400)
RBC: 4.12 MIL/uL — ABNORMAL LOW (ref 4.22–5.81)
RDW: 16.1 % — ABNORMAL HIGH (ref 11.5–15.5)
WBC: 8.4 10*3/uL (ref 4.0–10.5)
nRBC: 0 % (ref 0.0–0.2)

## 2023-07-07 LAB — RENAL FUNCTION PANEL
Albumin: 2.5 g/dL — ABNORMAL LOW (ref 3.5–5.0)
Anion gap: 8 (ref 5–15)
BUN: 12 mg/dL (ref 8–23)
CO2: 25 mmol/L (ref 22–32)
Calcium: 8.2 mg/dL — ABNORMAL LOW (ref 8.9–10.3)
Chloride: 104 mmol/L (ref 98–111)
Creatinine, Ser: 0.87 mg/dL (ref 0.61–1.24)
GFR, Estimated: >60 mL/min (ref >60)
Glucose, Bld: 109 mg/dL — ABNORMAL HIGH (ref 70–99)
Phosphorus: 2.9 mg/dL (ref 2.5–4.6)
Potassium: 3.6 mmol/L (ref 3.5–5.1)
Sodium: 137 mmol/L (ref 135–145)

## 2023-07-07 LAB — GLUCOSE, CAPILLARY
Glucose-Capillary: 106 mg/dL — ABNORMAL HIGH (ref 70–99)
Glucose-Capillary: 107 mg/dL — ABNORMAL HIGH (ref 70–99)
Glucose-Capillary: 111 mg/dL — ABNORMAL HIGH (ref 70–99)
Glucose-Capillary: 112 mg/dL — ABNORMAL HIGH (ref 70–99)
Glucose-Capillary: 118 mg/dL — ABNORMAL HIGH (ref 70–99)
Glucose-Capillary: 118 mg/dL — ABNORMAL HIGH (ref 70–99)

## 2023-07-07 LAB — AMMONIA: Ammonia: 33 umol/L (ref 9–35)

## 2023-07-07 LAB — CULTURE, RESPIRATORY W GRAM STAIN

## 2023-07-07 LAB — MAGNESIUM: Magnesium: 2.2 mg/dL (ref 1.7–2.4)

## 2023-07-07 MED ORDER — ACETAMINOPHEN 325 MG PO TABS
650.0000 mg | ORAL_TABLET | Freq: Four times a day (QID) | ORAL | Status: DC | PRN
  Administered 2023-07-08 – 2023-07-15 (×10): 650 mg via ORAL
  Filled 2023-07-07 (×10): qty 2

## 2023-07-07 MED ORDER — THIAMINE HCL 100 MG/ML IJ SOLN
100.0000 mg | Freq: Every day | INTRAMUSCULAR | Status: DC
  Administered 2023-07-07 – 2023-07-09 (×3): 100 mg via INTRAVENOUS
  Filled 2023-07-07 (×3): qty 2

## 2023-07-07 MED ORDER — POTASSIUM CHLORIDE 10 MEQ/100ML IV SOLN
10.0000 meq | INTRAVENOUS | Status: AC
  Administered 2023-07-07 (×2): 10 meq via INTRAVENOUS
  Filled 2023-07-07 (×2): qty 100

## 2023-07-07 MED ORDER — IRON SUCROSE 500 MG IVPB - SIMPLE MED
500.0000 mg | INTRAVENOUS | Status: AC
  Administered 2023-07-07 – 2023-07-08 (×2): 500 mg via INTRAVENOUS
  Filled 2023-07-07 (×2): qty 275

## 2023-07-07 MED ORDER — ACETAMINOPHEN 650 MG RE SUPP: 650.0000 mg | Freq: Four times a day (QID) | RECTAL | Status: DC | PRN

## 2023-07-07 MED ORDER — ORAL CARE MOUTH RINSE: 15.0000 mL | OROMUCOSAL | Status: DC | PRN

## 2023-07-07 MED ORDER — ATORVASTATIN CALCIUM 80 MG PO TABS
80.0000 mg | ORAL_TABLET | Freq: Every day | ORAL | Status: DC
  Administered 2023-07-08 – 2023-07-14 (×7): 80 mg via ORAL
  Filled 2023-07-07 (×7): qty 1

## 2023-07-07 MED ORDER — FUROSEMIDE 10 MG/ML IJ SOLN
20.0000 mg | Freq: Once | INTRAMUSCULAR | Status: AC
  Administered 2023-07-07: 20 mg via INTRAVENOUS
  Filled 2023-07-07: qty 2

## 2023-07-07 MED ORDER — ASPIRIN 300 MG RE SUPP
150.0000 mg | Freq: Every day | RECTAL | Status: DC
  Administered 2023-07-07: 150 mg via RECTAL
  Filled 2023-07-07 (×2): qty 1

## 2023-07-07 MED ORDER — DOCUSATE SODIUM 50 MG/5ML PO LIQD: 100.0000 mg | Freq: Two times a day (BID) | ORAL | Status: DC

## 2023-07-07 MED ORDER — POTASSIUM CHLORIDE 10 MEQ/50ML IV SOLN
10.0000 meq | INTRAVENOUS | Status: DC
  Filled 2023-07-07: qty 50

## 2023-07-07 MED ORDER — THIAMINE MONONITRATE 100 MG PO TABS: 100.0000 mg | ORAL_TABLET | Freq: Every day | ORAL | Status: DC

## 2023-07-07 MED ORDER — POLYETHYLENE GLYCOL 3350 17 G PO PACK: 17.0000 g | PACK | Freq: Two times a day (BID) | ORAL | Status: DC

## 2023-07-07 MED ORDER — FOLIC ACID 5 MG/ML IJ SOLN
1.0000 mg | Freq: Every day | INTRAMUSCULAR | Status: DC
  Administered 2023-07-07 – 2023-07-09 (×3): 1 mg via INTRAVENOUS
  Filled 2023-07-07 (×3): qty 0.2

## 2023-07-07 MED ORDER — ASPIRIN 81 MG PO CHEW: 81.0000 mg | CHEWABLE_TABLET | Freq: Every day | ORAL | Status: DC

## 2023-07-07 MED ORDER — ADULT MULTIVITAMIN W/MINERALS CH
1.0000 | ORAL_TABLET | Freq: Every day | ORAL | Status: DC
  Administered 2023-07-09: 1 via ORAL
  Filled 2023-07-07: qty 1

## 2023-07-07 MED ORDER — SENNOSIDES 8.8 MG/5ML PO SYRP: 10.0000 mL | ORAL_SOLUTION | Freq: Every day | ORAL | Status: DC

## 2023-07-07 MED ORDER — SODIUM CHLORIDE 0.9 % IV SOLN
2.0000 g | INTRAVENOUS | Status: AC
  Administered 2023-07-07 – 2023-07-10 (×4): 2 g via INTRAVENOUS
  Filled 2023-07-07 (×4): qty 20

## 2023-07-07 MED ORDER — FOLIC ACID 1 MG PO TABS: 1.0000 mg | ORAL_TABLET | Freq: Every day | ORAL | Status: DC

## 2023-07-07 NOTE — Plan of Care (Signed)
  Problem: Clinical Measurements: Goal: Will remain free from infection Outcome: Progressing Goal: Diagnostic test results will improve Outcome: Progressing Goal: Respiratory complications will improve Outcome: Progressing Goal: Cardiovascular complication will be avoided Outcome: Progressing   Problem: Elimination: Goal: Will not experience complications related to urinary retention Outcome: Progressing   Problem: Skin Integrity: Goal: Risk for impaired skin integrity will decrease Outcome: Progressing   Problem: Safety: Goal: Non-violent Restraint(s) Outcome: Progressing   Problem: Respiratory: Goal: Ability to maintain a clear airway and adequate ventilation will improve Outcome: Progressing   Problem: Fluid Volume: Goal: Ability to maintain a balanced intake and output will improve Outcome: Progressing   Problem: Metabolic: Goal: Ability to maintain appropriate glucose levels will improve Outcome: Progressing   Problem: Nutritional: Goal: Progress toward achieving an optimal weight will improve Outcome: Progressing   Problem: Skin Integrity: Goal: Risk for impaired skin integrity will decrease Outcome: Progressing   Problem: Tissue Perfusion: Goal: Adequacy of tissue perfusion will improve Outcome: Progressing

## 2023-07-07 NOTE — Progress Notes (Signed)
   07/07/23 2253  Oxygen Therapy/Pulse Ox  O2 Device HFNC  O2 Therapy Oxygen humidified  Heater temperature 93.2 F (34 C)  O2 Flow Rate (L/min) (S)  50 L/min  FiO2 (%) (S)  50 %  SpO2 94 %   Increased FIO2 to 50% and flow to 50L due to sat low and increased WOB. Pt is very restless which may be contributing to his WOB. RN aware. RN speaking with e-link now.  Nelda Marseille

## 2023-07-07 NOTE — Progress Notes (Signed)
Rounding Note    Patient Name: Raymond Roberson Date of Encounter: 07/07/2023  [facility] Cardiologist: Marjo Bicker, MD    Subjective   67 year old gentleman was admitted to [facility] elevated heart rate, blood pressure and shortness of breath.  Patient has a history of coronary artery disease and is status post non-ST segment elevation myocardial infarction this past January.  He was found to have an occluded ostial likely artery and mid LAD stenosis.  He had coronary artery bypass grafting with a LIMA to the LAD and replacement of aortic valve at that time.  His LVEF is mildly reduced at 40 to 45%.  He has a history of alcohol abuse for many years.  He drinks up to 12 beers a day.  He was found to be in atrial flutter with rapid ventricular response. Eliquis was started.  Troponins are minimally elevated with a flat trend ( 25, 25)  No active cardiac issues    Inpatient Medications    Scheduled Meds:  aspirin  81 mg Per Tube Daily   atorvastatin  80 mg Per Tube QHS   Chlorhexidine Gluconate Cloth  6 each Topical Daily   docusate  100 mg Per Tube BID   enoxaparin (LOVENOX) injection  80 mg Subcutaneous Q12H   folic acid  1 mg Per Tube Daily   multivitamin with minerals  1 tablet Per Tube Daily   pantoprazole (PROTONIX) IV  40 mg Intravenous Daily   PHENObarbital  97.5 mg Intravenous Q8H   Followed by   [START ON 07/08/2023] PHENObarbital  65 mg Intravenous Q8H   Followed by   [START ON 07/10/2023] PHENObarbital  32.5 mg Intravenous Q8H   polyethylene glycol  17 g Per Tube BID   sennosides  10 mL Per Tube QHS   [START ON 07/08/2023] thiamine  100 mg Per Tube Daily   Continuous Infusions:  sodium chloride 5 mL/hr at 07/06/23 1800   amiodarone 30 mg/hr (07/07/23 0256)   ampicillin-sulbactam (UNASYN) IV 3 g (07/07/23 0533)   dexmedetomidine (PRECEDEX) IV infusion 1.4 mcg/kg/hr (07/07/23 0532)   feeding supplement (VITAL AF 1.2 CAL) Stopped  (07/06/23 1245)   norepinephrine (LEVOPHED) Adult infusion Stopped (07/06/23 1246)   thiamine (VITAMIN B1) injection Stopped (07/06/23 0941)   PRN Meds: acetaminophen **OR** acetaminophen, bisacodyl, HYDROcodone-acetaminophen, ipratropium-albuterol, LORazepam, metoprolol tartrate   Vital Signs    Vitals:   07/07/23 0400 07/07/23 0500 07/07/23 0741 07/07/23 0846  BP: 127/88 124/89    Pulse: 78 79  78  Resp: (!) 27 (!) 31  (!) 28  Temp:   100.1 F (37.8 C)   TempSrc:   Axillary   SpO2: 100% 99%  97%  Weight:  83.1 kg    Height:        Intake/Output Summary (Last 24 hours) at 07/07/2023 0906 Last data filed at 07/07/2023 0700 Gross per 24 hour  Intake 985.16 ml  Output 2545 ml  Net -1559.84 ml      07/07/2023    5:00 AM 07/06/2023    4:41 AM 07/05/2023    4:21 AM  Last 3 Weights  Weight (lbs) 183 lb 3.2 oz 186 lb 8.2 oz 186 lb 15.2 oz  Weight (kg) 83.1 kg 84.6 kg 84.8 kg      Telemetry    Atrial flutter with controlled V response  - Personally Reviewed  ECG     - Personally Reviewed  Physical Exam   GEN: No acute distress.   Neck:  No JVD Cardiac: RRR, no murmurs, rubs, or gallops.  Respiratory: Clear to auscultation bilaterally. GI: Soft, nontender, non-distended  MS: No edema; No deformity. Neuro:  awake,  not very coherent at this point  Psych:  unable to assess at this point   Labs    High Sensitivity Troponin:   Recent Labs  Lab 07/02/23 1449 07/02/23 1629  TROPONINIHS 25* 25*     Chemistry Recent Labs  Lab 07/02/23 1449 07/03/23 0455 07/05/23 0645 07/05/23 1737 07/06/23 0732 07/07/23 0110  NA 134*   < > 135  --  134* 137  K 3.8   < > 3.7  --  3.9 3.6  CL 99   < > 100  --  102 104  CO2 26   < > 20*  --  23 25  GLUCOSE 107*   < > 79  --  125* 109*  BUN 13   < > 11  --  11 12  CREATININE 0.84   < > 0.99  --  0.88 0.87  CALCIUM 9.3   < > 8.5*  --  8.3* 8.2*  MG 2.1   < > 2.3 2.1 2.3 2.2  PROT 8.3*  --   --   --   --   --   ALBUMIN 4.0  --   2.9*  --  2.5* 2.5*  AST 28  --   --   --   --   --   ALT 30  --   --   --   --   --   ALKPHOS 62  --   --   --   --   --   BILITOT 1.3*  --   --   --   --   --   GFRNONAA >60   < > >60  --  >60 >60  ANIONGAP 9   < > 15  --  9 8   < > = values in this interval not displayed.    Lipids  Recent Labs  Lab 07/03/23 0455  TRIG 120    Hematology Recent Labs  Lab 07/05/23 0645 07/06/23 0732 07/07/23 0110  WBC 11.1* 10.7* 8.4  RBC 5.32  5.33 4.88 4.12*  HGB 11.5* 10.5* 8.8*  HCT 39.3 36.3* 30.7*  MCV 73.9* 74.4* 74.5*  MCH 21.6* 21.5* 21.4*  MCHC 29.3* 28.9* 28.7*  RDW 16.6* 16.4* 16.1*  PLT 219 225 215   Thyroid No results for input(s): "TSH", "FREET4" in the last 168 hours.  BNPNo results for input(s): "BNP", "PROBNP" in the last 168 hours.  DDimer No results for input(s): "DDIMER" in the last 168 hours.   Radiology    No results found.  Cardiac Studies      Patient Profile     67 y.o. male   Assessment & Plan     Atrial flutter :   rate is well controlled.   On Eliquis  Hemodynamically stable   Cont amio drip   2. ETOH withdrawal:    plans per PCCM       For questions or updates, please contact [facility] Please consult [email or web address] for contact info under        Signed, Kristeen Miss, MD  07/07/2023, 9:06 AM

## 2023-07-07 NOTE — Progress Notes (Addendum)
eLink Physician-Brief Progress Note Patient Name: Raymond Roberson DOB: [birth year] MRN: [id number]   Date of Service  07/07/2023  HPI/Events of Note  Notified of tachypnea with RR in the 30s. Pt with alcohol withdrawal on precedex gtt, phenobarb and ativan.  Pt was extubated earlier today.  eICU Interventions  Get stat CXR.      Intervention Category Intermediate Interventions: Respiratory distress - evaluation and management  Larinda Buttery 07/07/2023, 10:55 PM  11:40 PM CXR shows pulmonary edema.  BP 139/91, RR 26, O2 sats 99%.   Plan> Give Lasix 20mg  IV now.  Monitor I&Os.  Pt may need redosing of diuretic.  Consider BIPAP if minimal response from lasix.

## 2023-07-07 NOTE — Progress Notes (Addendum)
NAME:  Raymond Roberson, MRN:  [id number], DOB:  [birth year], LOS: 5 ADMISSION DATE:  07/02/2023, CONSULTATION DATE: 07/07/23 REFERRING MD:  EDP, CHIEF COMPLAINT:  ETOH withdrawal   History of Present Illness:  Raymond Roberson is a 67 y.o. M with PMH significant for CAD s/p CABG 12/2022, Ischemic cardiomyopathy, HTN, HFrEF and ETOH abuse with reported 12 beers/day who presented to [facility] with two days of dizziness and shortness of breath.  He stopped drinking thinking that this would help (evening of 7/8), and then developed tremulousness and diaphoresis.   In the ED he was found to be in Afib/flutter and was treated with amiodarone.  He became increasingly agitated and required precedex and eventual intubation so was transferred to [facility].  Labs notable for flat troponin 25>25, ethanol <10, ABG 7.52/30/158/25  Pertinent  Medical History   has a past medical history of Aortic stenosis, Bicuspid aortic valve, CAD (coronary artery disease), Chronic alcohol abuse, Classic migraine (09/02/2013), CVA (cerebral vascular accident) (HCC), Diabetes mellitus without complication (HCC), GERD (gastroesophageal reflux disease), Gouty arthritis, Hypertension, Major depression, Mixed hyperlipidemia, Peptic ulcer disease, and Sleep apnea.  Significant Hospital Events: Including procedures, antibiotic start and stop dates in addition to other pertinent events   7/8 presented to [facility] in aflutter and developed worsening ETOH withdrawal requiring intubation 7/9 arrived to [facility] stable on propofol and CIWA 7/11 febrile overnight, cultured, abx started, still intermittently agitated 7/12 trial of extubation  Interim History / Subjective:  Pretty tenuous after extubation due to secretion clearance issues.  Remains on precedex, agitated.  Objective   Blood pressure 124/89, pulse 78, temperature 100.1 F (37.8 C), temperature source Axillary, resp. rate (!) 28, height 6' (1.829 m), weight 83.1 kg, SpO2 97%.    Vent Mode:  PSV;CPAP FiO2 (%):  [30 %-40 %] 40 % PEEP:  [5 cmH20] 5 cmH20 Pressure Support:  [5 cmH20] 5 cmH20 Plateau Pressure:  [16 cmH20] 16 cmH20   Intake/Output Summary (Last 24 hours) at 07/07/2023 1057 Last data filed at 07/07/2023 0900 Gross per 24 hour  Intake 1480.71 ml  Output 2545 ml  Net -1064.29 ml   Filed Weights   07/05/23 0421 07/06/23 0441 07/07/23 0500  Weight: 84.8 kg 84.6 kg 83.1 kg   Chronically ill appearing Transmitted upper airway sounds HR controlled on amio, remains in aflutter Moves ext x 4 Ext no edema Abd soft No rashes Pupils equal  CBC/BMP look fine  Patient Lines/Drains/Airways Status     Active Line/Drains/Airways     Name Placement date Placement time Site Days   Peripheral IV 07/06/23 20 G Posterior;Right Wrist 07/06/23  1721  Wrist  1   Peripheral IV 07/06/23 20 G 1.88" Anterior;Left Forearm 07/06/23  1718  Forearm  1   Urethral Catheter Shreejana, RN 14 Fr. 07/03/23  0033  --  4             Resolved Hospital Problem list     Assessment & Plan:   Toxic metabolic encephalopathy due to ETOH withdrawal; stopped drinking evening of 07/02/23; some dizziness and SOB preceded stopping drinking so the Aflutter may have preceded w/d ETOH abuse  Acute hypoxic respiratory failure secondary to above and likely aspiration- remain tenuous after extubation 07/06/23 Arial fib/flutter, new onset History of CAD w/CABG (12/2022 x1 LIMA-LAD), ischemic cardiomyopathy/ HFrEF, HTN Dysphagia- related to encephalopathy Iron def anemia- will replete IV  - Thiamine/ folate IV - Phenobarb taper, precedex and PRN ativan fine as bridge - Close airway watch;  currently on 40LPM 40% FiO2 - Eliquis to lovenox - Check ammonia - Resume CAD meds once have enteral access, probably Monday unless mental status improved markedly - Abx to rocephin to complete ~5 days  Best Practice (right click and "Reselect all SmartList Selections" daily)   Diet/type: NPO; DVT  prophylaxis: LMWH GI prophylaxis: PPI Lines: N/A Foley: DC Code Status:  full code Last date of multidisciplinary goals of care discussion [pending]  33 min cc time Myrla Halsted MD PCCM  [facility] 07/07/2023, 10:57 AM  See Amion for pager If no response to pager , please call [phone] until 7pm After 7:00 pm call Elink  [phone]

## 2023-07-08 DIAGNOSIS — I5043 Acute on chronic combined systolic (congestive) and diastolic (congestive) heart failure: Secondary | ICD-10-CM | POA: Diagnosis not present

## 2023-07-08 DIAGNOSIS — I4892 Unspecified atrial flutter: Secondary | ICD-10-CM | POA: Diagnosis not present

## 2023-07-08 DIAGNOSIS — I483 Typical atrial flutter: Secondary | ICD-10-CM | POA: Diagnosis not present

## 2023-07-08 LAB — POCT I-STAT 7, (LYTES, BLD GAS, ICA,H+H)
Acid-Base Excess: 2 mmol/L (ref 0.0–2.0)
Bicarbonate: 27.3 mmol/L (ref 20.0–28.0)
Calcium, Ion: 1.23 mmol/L (ref 1.15–1.40)
HCT: 30 % — ABNORMAL LOW (ref 39.0–52.0)
Hemoglobin: 10.2 g/dL — ABNORMAL LOW (ref 13.0–17.0)
O2 Saturation: 98 %
Patient temperature: 99.1
Potassium: 3.8 mmol/L (ref 3.5–5.1)
Sodium: 138 mmol/L (ref 135–145)
TCO2: 29 mmol/L (ref 22–32)
pCO2 arterial: 42.8 mmHg (ref 32–48)
pH, Arterial: 7.414 (ref 7.35–7.45)
pO2, Arterial: 97 mmHg (ref 83–108)

## 2023-07-08 LAB — CULTURE, RESPIRATORY W GRAM STAIN

## 2023-07-08 LAB — BASIC METABOLIC PANEL
Anion gap: 15 (ref 5–15)
BUN: 14 mg/dL (ref 8–23)
CO2: 26 mmol/L (ref 22–32)
Calcium: 9 mg/dL (ref 8.9–10.3)
Chloride: 98 mmol/L (ref 98–111)
Creatinine, Ser: 0.81 mg/dL (ref 0.61–1.24)
GFR, Estimated: >60 mL/min (ref >60)
Glucose, Bld: 113 mg/dL — ABNORMAL HIGH (ref 70–99)
Potassium: 3.9 mmol/L (ref 3.5–5.1)
Sodium: 139 mmol/L (ref 135–145)

## 2023-07-08 LAB — GLUCOSE, CAPILLARY
Glucose-Capillary: 109 mg/dL — ABNORMAL HIGH (ref 70–99)
Glucose-Capillary: 113 mg/dL — ABNORMAL HIGH (ref 70–99)
Glucose-Capillary: 102 mg/dL — ABNORMAL HIGH (ref 70–99)
Glucose-Capillary: 91 mg/dL (ref 70–99)
Glucose-Capillary: 81 mg/dL (ref 70–99)

## 2023-07-08 LAB — MAGNESIUM: Magnesium: 2.3 mg/dL (ref 1.7–2.4)

## 2023-07-08 MED ORDER — OXYCODONE HCL 5 MG PO TABS
5.0000 mg | ORAL_TABLET | ORAL | Status: DC | PRN
  Administered 2023-07-08 – 2023-07-15 (×30): 5 mg via ORAL
  Filled 2023-07-08 (×30): qty 1

## 2023-07-08 NOTE — Progress Notes (Signed)
NAME:  Raymond Roberson, MRN:  [id number], DOB:  Sep 20, 1956, LOS: 6 ADMISSION DATE:  07/02/2023, CONSULTATION DATE: 07/08/23 REFERRING MD:  EDP, CHIEF COMPLAINT:  ETOH withdrawal   History of Present Illness:  Raymond Roberson is a 67 y.o. M with PMH significant for CAD s/p CABG 12/2022, Ischemic cardiomyopathy, HTN, HFrEF and ETOH abuse with reported 12 beers/day who presented to [facility] with two days of dizziness and shortness of breath.  He stopped drinking thinking that this would help (evening of 7/8), and then developed tremulousness and diaphoresis.   In the ED he was found to be in Afib/flutter and was treated with amiodarone.  He became increasingly agitated and required precedex and eventual intubation so was transferred to [facility].  Labs notable for flat troponin 25>25, ethanol <10, ABG 7.52/30/158/25  Pertinent  Medical History   has a past medical history of Aortic stenosis, Bicuspid aortic valve, CAD (coronary artery disease), Chronic alcohol abuse, Classic migraine (09/02/2013), CVA (cerebral vascular accident) (HCC), Diabetes mellitus without complication (HCC), GERD (gastroesophageal reflux disease), Gouty arthritis, Hypertension, Major depression, Mixed hyperlipidemia, Peptic ulcer disease, and Sleep apnea.  Significant Hospital Events: Including procedures, antibiotic start and stop dates in addition to other pertinent events   7/8 presented to [facility] in aflutter and developed worsening ETOH withdrawal requiring intubation 7/9 arrived to [facility] stable on propofol and CIWA 7/11 febrile overnight, cultured, abx started, still intermittently agitated 7/12 trial of extubation  Interim History / Subjective:  Agonally breathing on precedex this am. ABG ok  Objective   Blood pressure 122/76, pulse 78, temperature 99.1 F (37.3 C), temperature source Axillary, resp. rate (!) 26, height 6' (1.829 m), weight 83.1 kg, SpO2 100%.    FiO2 (%):  [40 %-50 %] 50 %   Intake/Output Summary (Last 24  hours) at 07/08/2023 1322 Last data filed at 07/08/2023 1154 Gross per 24 hour  Intake 1541.24 ml  Output 2500 ml  Net -958.76 ml   Filed Weights   07/05/23 0421 07/06/23 0441 07/07/23 0500  Weight: 84.8 kg 84.6 kg 83.1 kg   Ill appearing Sonorous respirations Will wake up and follow commands with enough prompting Abd soft Heart sounds regular, ext warm  ABG and BMP look okay   Resolved Hospital Problem list     Assessment & Plan:   Toxic metabolic encephalopathy due to ETOH withdrawal; stopped drinking evening of 07/02/23; some dizziness and SOB preceded stopping drinking so the Aflutter may have preceded w/d.  Ammonia neg. ETOH abuse  Acute hypoxic respiratory failure secondary to above and likely aspiration- remain tenuous after extubation 07/06/23 Arial fib/flutter, new onset History of CAD w/CABG (12/2022 x1 LIMA-LAD), ischemic cardiomyopathy/ HFrEF, HTN Dysphagia- related to encephalopathy Iron def anemia- will replete IV  - Thiamine/ folate IV - Phenobarb taper, precedex and PRN ativan fine as bridge; continue for now - Close airway watch; currently on 50LPM 50% FiO2 (worse than yesterday); HHFNC for sats > 90% - Lovenox until PO access then eliquis - Resume CAD meds once have enteral access, probably Monday unless mental status improved markedly: will put in for cortrak - Rocephin to complete ~5 days - Encourage day night cycles, family visitation  Best Practice (right click and "Reselect all SmartList Selections" daily)   Diet/type: NPO; DVT prophylaxis: LMWH GI prophylaxis: PPI Lines: N/A Foley: DC Code Status:  full code Last date of multidisciplinary goals of care discussion [pending]  31 min cc time Myrla Halsted MD PCCM  [facility]  07/08/2023, 1:22 PM  See Amion for pager If no response to pager , please call [phone] until 7pm After 7:00 pm call Elink  [phone]

## 2023-07-08 NOTE — TOC CM/SW Note (Signed)
TOC acknowledges referral requesting TOC assistance with transportation for Patients fiance who has been stuck here for several days, and she has no way to get home. Lives in [place].  Per charge nurse- fiance is not requesting assistance- and voiced that she does not want to leave at this time.

## 2023-07-08 NOTE — Progress Notes (Addendum)
Rounding Note    Patient Name: Raymond Roberson Date of Encounter: 07/08/2023  [facility] Cardiologist: Marjo Bicker, MD    Subjective   67 year old gentleman was admitted to [facility] elevated heart rate, blood pressure and shortness of breath.  Patient has a history of coronary artery disease and is status post non-ST segment elevation myocardial infarction this past January.  He was found to have an occluded ostial likely artery and mid LAD stenosis.  He had coronary artery bypass grafting with a LIMA to the LAD and replacement of aortic valve at that time.  His LVEF is mildly reduced at 40 to 45%.  He has a history of alcohol abuse for many years.  He drinks up to 12 beers a day.  He was found to be in atrial flutter with rapid ventricular response. Eliquis was started.  Troponins are minimally elevated with a flat trend ( 25, 25)      Inpatient Medications    Scheduled Meds:  aspirin  150 mg Rectal Daily   atorvastatin  80 mg Oral QHS   Chlorhexidine Gluconate Cloth  6 each Topical Daily   docusate  100 mg Oral BID   enoxaparin (LOVENOX) injection  80 mg Subcutaneous Q12H   folic acid  1 mg Intravenous Daily   multivitamin with minerals  1 tablet Oral Daily   PHENObarbital  65 mg Intravenous Q8H   Followed by   [START ON 07/10/2023] PHENObarbital  32.5 mg Intravenous Q8H   polyethylene glycol  17 g Oral BID   sennosides  10 mL Oral QHS   thiamine (VITAMIN B1) injection  100 mg Intravenous Daily   Continuous Infusions:  sodium chloride 10 mL/hr at 07/08/23 0900   amiodarone 30 mg/hr (07/08/23 0900)   cefTRIAXone (ROCEPHIN)  IV Stopped (07/07/23 1228)   dexmedetomidine (PRECEDEX) IV infusion Stopped (07/08/23 0824)   iron sucrose Stopped (07/07/23 1802)   norepinephrine (LEVOPHED) Adult infusion Stopped (07/06/23 1246)   PRN Meds: acetaminophen **OR** acetaminophen, bisacodyl, ipratropium-albuterol, LORazepam, metoprolol tartrate, mouth  rinse   Vital Signs    Vitals:   07/08/23 0830 07/08/23 0841 07/08/23 0845 07/08/23 0900  BP:    122/76  Pulse: 80 79 79 78  Resp: (!) 27 (!) 28 (!) 27 (!) 26  Temp:      TempSrc:      SpO2: 100% 100% 100% 100%  Weight:      Height:        Intake/Output Summary (Last 24 hours) at 07/08/2023 0925 Last data filed at 07/08/2023 0900 Gross per 24 hour  Intake 1732.74 ml  Output 1550 ml  Net 182.74 ml      07/07/2023    5:00 AM 07/06/2023    4:41 AM 07/05/2023    4:21 AM  Last 3 Weights  Weight (lbs) 183 lb 3.2 oz 186 lb 8.2 oz 186 lb 15.2 oz  Weight (kg) 83.1 kg 84.6 kg 84.8 kg      Telemetry    Atrial flutter with controlled V response  - Personally Reviewed  ECG     - Personally Reviewed  Physical Exam   GEN: No acute distress.   Neck: No JVD Cardiac: RRR, no murmurs, rubs, or gallops.  Respiratory: Clear to auscultation bilaterally. GI: Soft, nontender, non-distended  MS: No edema; No deformity. Neuro:  awake,  not very coherent at this point  Psych:  unable to assess at this point   Labs    High Sensitivity  Troponin:   Recent Labs  Lab 07/02/23 1449 07/02/23 1629  TROPONINIHS 25* 25*     Chemistry Recent Labs  Lab 07/02/23 1449 07/03/23 0455 07/05/23 0645 07/05/23 1737 07/06/23 0732 07/07/23 0110 07/08/23 0036 07/08/23 0838  NA 134*   < > 135  --  134* 137 139 138  K 3.8   < > 3.7  --  3.9 3.6 3.9 3.8  CL 99   < > 100  --  102 104 98  --   CO2 26   < > 20*  --  23 25 26   --   GLUCOSE 107*   < > 79  --  125* 109* 113*  --   BUN 13   < > 11  --  11 12 14   --   CREATININE 0.84   < > 0.99  --  0.88 0.87 0.81  --   CALCIUM 9.3   < > 8.5*  --  8.3* 8.2* 9.0  --   MG 2.1   < > 2.3   < > 2.3 2.2 2.3  --   PROT 8.3*  --   --   --   --   --   --   --   ALBUMIN 4.0  --  2.9*  --  2.5* 2.5*  --   --   AST 28  --   --   --   --   --   --   --   ALT 30  --   --   --   --   --   --   --   ALKPHOS 62  --   --   --   --   --   --   --   BILITOT 1.3*  --    --   --   --   --   --   --   GFRNONAA >60   < > >60  --  >60 >60 >60  --   ANIONGAP 9   < > 15  --  9 8 15   --    < > = values in this interval not displayed.    Lipids  Recent Labs  Lab 07/03/23 0455  TRIG 120    Hematology Recent Labs  Lab 07/05/23 0645 07/06/23 0732 07/07/23 0110 07/08/23 0838  WBC 11.1* 10.7* 8.4  --   RBC 5.32  5.33 4.88 4.12*  --   HGB 11.5* 10.5* 8.8* 10.2*  HCT 39.3 36.3* 30.7* 30.0*  MCV 73.9* 74.4* 74.5*  --   MCH 21.6* 21.5* 21.4*  --   MCHC 29.3* 28.9* 28.7*  --   RDW 16.6* 16.4* 16.1*  --   PLT 219 225 215  --    Thyroid No results for input(s): "TSH", "FREET4" in the last 168 hours.  BNPNo results for input(s): "BNP", "PROBNP" in the last 168 hours.  DDimer No results for input(s): "DDIMER" in the last 168 hours.   Radiology    DG CHEST PORT 1 VIEW  Result Date: 07/07/2023 CLINICAL DATA:  Respiratory failure EXAM: PORTABLE CHEST 1 VIEW COMPARISON:  07/05/2023 FINDINGS: Endotracheal tube and gastric catheter have been removed in the interval. Cardiac shadow remains enlarged. Postsurgical changes are again seen. Increased central vascular congestion is noted. Increasing basilar airspace opacity is noted when compare with the prior study. IMPRESSION: New vascular congestion.  Increased basilar opacity bilaterally. Electronically Signed   By: Alcide Clever M.D.   On:  07/07/2023 23:29    Cardiac Studies      Patient Profile     67 y.o. male   Assessment & Plan     Atrial flutter :   HR is well controlled. .   On amio drip OK to transition to PO amio 200 mg every day once he's taking POs Currently on Lovenox 80 SQ Q 12 hours Ok to transition to eliquis 5 mg PO BID    2. ETOH withdrawal:    plans per PCCM      3.  Acute on chronic combined CHF:  likely due to ETOH abuse BP has been marginal but is better today ,  still not taking POs Would start Losartan 25 mg a day once he's eating . Will add additional CHF meds as OP.     Cardiology will sign off . Call for questions  He will follow up with Dr. Marjo Bicker, MD in the office    For questions or updates, please contact [facility] Please consult [email or web address] for contact info under        Signed, Kristeen Miss, MD  07/08/2023, 9:25 AM

## 2023-07-09 DIAGNOSIS — G9340 Encephalopathy, unspecified: Secondary | ICD-10-CM

## 2023-07-09 DIAGNOSIS — I4892 Unspecified atrial flutter: Secondary | ICD-10-CM | POA: Diagnosis not present

## 2023-07-09 DIAGNOSIS — J153 Pneumonia due to streptococcus, group B: Secondary | ICD-10-CM

## 2023-07-09 LAB — CULTURE, BLOOD (ROUTINE X 2): Culture: NO GROWTH

## 2023-07-09 LAB — GLUCOSE, CAPILLARY
Glucose-Capillary: 82 mg/dL (ref 70–99)
Glucose-Capillary: 100 mg/dL — ABNORMAL HIGH (ref 70–99)
Glucose-Capillary: 90 mg/dL (ref 70–99)
Glucose-Capillary: 86 mg/dL (ref 70–99)
Glucose-Capillary: 110 mg/dL — ABNORMAL HIGH (ref 70–99)

## 2023-07-09 LAB — BASIC METABOLIC PANEL
Anion gap: 10 (ref 5–15)
BUN: 11 mg/dL (ref 8–23)
CO2: 24 mmol/L (ref 22–32)
Calcium: 8.8 mg/dL — ABNORMAL LOW (ref 8.9–10.3)
Chloride: 101 mmol/L (ref 98–111)
Creatinine, Ser: 0.79 mg/dL (ref 0.61–1.24)
GFR, Estimated: >60 mL/min (ref >60)
Glucose, Bld: 86 mg/dL (ref 70–99)
Potassium: 3 mmol/L — ABNORMAL LOW (ref 3.5–5.1)
Sodium: 135 mmol/L (ref 135–145)

## 2023-07-09 LAB — MAGNESIUM: Magnesium: 2.4 mg/dL (ref 1.7–2.4)

## 2023-07-09 MED ORDER — METOPROLOL TARTRATE 12.5 MG HALF TABLET
12.5000 mg | ORAL_TABLET | Freq: Two times a day (BID) | ORAL | Status: DC
  Administered 2023-07-09 – 2023-07-12 (×7): 12.5 mg via ORAL
  Filled 2023-07-09 (×7): qty 1

## 2023-07-09 MED ORDER — FOLIC ACID 1 MG PO TABS
1.0000 mg | ORAL_TABLET | Freq: Every day | ORAL | Status: DC
  Administered 2023-07-10 – 2023-07-15 (×6): 1 mg via ORAL
  Filled 2023-07-09 (×6): qty 1

## 2023-07-09 MED ORDER — HALOPERIDOL LACTATE 5 MG/ML IJ SOLN
5.0000 mg | Freq: Four times a day (QID) | INTRAMUSCULAR | Status: DC | PRN
  Administered 2023-07-09 – 2023-07-10 (×3): 5 mg via INTRAVENOUS
  Filled 2023-07-09 (×4): qty 1

## 2023-07-09 MED ORDER — THIAMINE HCL 100 MG/ML IJ SOLN: 100.0000 mg | Freq: Every day | INTRAMUSCULAR | Status: DC

## 2023-07-09 MED ORDER — AMIODARONE HCL 200 MG PO TABS
200.0000 mg | ORAL_TABLET | Freq: Every day | ORAL | Status: DC
  Administered 2023-07-09 – 2023-07-11 (×3): 200 mg via ORAL
  Filled 2023-07-09 (×3): qty 1

## 2023-07-09 MED ORDER — LORAZEPAM 2 MG/ML IJ SOLN
1.0000 mg | INTRAMUSCULAR | Status: AC | PRN
  Administered 2023-07-10 – 2023-07-11 (×6): 2 mg via INTRAVENOUS
  Filled 2023-07-09: qty 2
  Filled 2023-07-09 (×5): qty 1

## 2023-07-09 MED ORDER — CLONIDINE HCL 0.1 MG PO TABS
0.1000 mg | ORAL_TABLET | ORAL | Status: DC
  Administered 2023-07-11 – 2023-07-12 (×3): 0.1 mg via ORAL
  Filled 2023-07-09 (×3): qty 1

## 2023-07-09 MED ORDER — METHOCARBAMOL 500 MG PO TABS
500.0000 mg | ORAL_TABLET | Freq: Three times a day (TID) | ORAL | Status: AC | PRN
  Administered 2023-07-09 – 2023-07-13 (×5): 500 mg via ORAL
  Filled 2023-07-09 (×6): qty 1

## 2023-07-09 MED ORDER — CLONIDINE HCL 0.1 MG PO TABS: 0.1000 mg | ORAL_TABLET | Freq: Every day | ORAL | Status: DC

## 2023-07-09 MED ORDER — LORAZEPAM 1 MG PO TABS
1.0000 mg | ORAL_TABLET | ORAL | Status: AC | PRN
  Administered 2023-07-10: 1 mg via ORAL
  Administered 2023-07-10 – 2023-07-11 (×2): 2 mg via ORAL
  Filled 2023-07-09: qty 1
  Filled 2023-07-09 (×2): qty 2

## 2023-07-09 MED ORDER — ASPIRIN 81 MG PO CHEW
81.0000 mg | CHEWABLE_TABLET | Freq: Every day | ORAL | Status: DC
  Administered 2023-07-09 – 2023-07-15 (×7): 81 mg via ORAL
  Filled 2023-07-09 (×7): qty 1

## 2023-07-09 MED ORDER — POTASSIUM CHLORIDE CRYS ER 20 MEQ PO TBCR
20.0000 meq | EXTENDED_RELEASE_TABLET | ORAL | Status: AC
  Administered 2023-07-09 (×2): 20 meq via ORAL
  Filled 2023-07-09 (×2): qty 1

## 2023-07-09 MED ORDER — HYDROXYZINE HCL 25 MG PO TABS
25.0000 mg | ORAL_TABLET | Freq: Four times a day (QID) | ORAL | Status: AC | PRN
  Administered 2023-07-09 – 2023-07-11 (×4): 25 mg via ORAL
  Filled 2023-07-09 (×4): qty 1

## 2023-07-09 MED ORDER — ADULT MULTIVITAMIN W/MINERALS CH
1.0000 | ORAL_TABLET | Freq: Every day | ORAL | Status: DC
  Administered 2023-07-10 – 2023-07-15 (×6): 1 via ORAL
  Filled 2023-07-09 (×6): qty 1

## 2023-07-09 MED ORDER — CLONIDINE HCL 0.1 MG PO TABS
0.1000 mg | ORAL_TABLET | Freq: Four times a day (QID) | ORAL | Status: AC
  Administered 2023-07-09 – 2023-07-10 (×7): 0.1 mg via ORAL
  Filled 2023-07-09 (×7): qty 1

## 2023-07-09 MED ORDER — LOPERAMIDE HCL 2 MG PO CAPS
2.0000 mg | ORAL_CAPSULE | Freq: Once | ORAL | Status: AC
  Administered 2023-07-09: 2 mg via ORAL
  Filled 2023-07-09: qty 1

## 2023-07-09 MED ORDER — DICYCLOMINE HCL 20 MG PO TABS
20.0000 mg | ORAL_TABLET | Freq: Four times a day (QID) | ORAL | Status: AC | PRN
  Administered 2023-07-09: 20 mg via ORAL
  Filled 2023-07-09 (×2): qty 1

## 2023-07-09 MED ORDER — THIAMINE MONONITRATE 100 MG PO TABS
100.0000 mg | ORAL_TABLET | Freq: Every day | ORAL | Status: DC
  Administered 2023-07-10: 100 mg via ORAL
  Filled 2023-07-09: qty 1

## 2023-07-09 MED ORDER — ENSURE ENLIVE PO LIQD
237.0000 mL | Freq: Three times a day (TID) | ORAL | Status: DC
  Administered 2023-07-09 – 2023-07-13 (×11): 237 mL via ORAL
  Filled 2023-07-09: qty 237

## 2023-07-09 MED ORDER — POTASSIUM CHLORIDE 10 MEQ/100ML IV SOLN
10.0000 meq | INTRAVENOUS | Status: AC
  Administered 2023-07-09 (×4): 10 meq via INTRAVENOUS
  Filled 2023-07-09 (×4): qty 100

## 2023-07-09 NOTE — Progress Notes (Signed)
[facility] ADULT ICU REPLACEMENT PROTOCOL   The patient does apply for the [facility] Adult ICU Electrolyte Replacment Protocol based on the criteria listed below:   1.Exclusion criteria: TCTS, ECMO, Dialysis, and Myasthenia Gravis patients 2. Is GFR >/= 30 ml/min? Yes.    Patient's GFR today is > 60 3. Is SCr </= 2? Yes.   Patient's SCr is 0.79 mg/dL 4. Did SCr increase >/= 0.5 in 24 hours? No. 5.Pt's weight >40kg  Yes.   6. Abnormal electrolyte(s): K+ 3.0  7. Electrolytes replaced per protocol 8.  Call MD STAT for K+ </= 2.5, Phos </= 1, or Mag </= 1 Physician:  Dr Eather Colas, Jettie Booze 07/09/2023 3:17 AM

## 2023-07-09 NOTE — Progress Notes (Signed)
Pt taken off Heated HFNC and placed on 4L Media by RT. Pt tolerating well at this time, no increased WOB noted, SpO2 94%, vitals stable, CCM aware, RN aware, RT will monitor as needed.      07/09/23 2202  Therapy Vitals  Pulse Rate (!) 132  Resp (!) 25  MEWS Score/Color  MEWS Score 4  MEWS Score Color Red  Respiratory Assessment  Assessment Type Assess only  Respiratory Pattern Regular;Unlabored  Chest Assessment Chest expansion symmetrical  Cough None  Oxygen Therapy/Pulse Ox  O2 Device (S)  Nasal Cannula  O2 Therapy Oxygen  O2 Flow Rate (L/min) 4 L/min  SpO2 94 %

## 2023-07-09 NOTE — Progress Notes (Signed)
Nutrition Follow-up  DOCUMENTATION CODES:   Not applicable  INTERVENTION:   Liberalize diet to REGULAR Feeding assistance and encouragement/redirection at meal times as pt is confused, +mittens in place  Ensure Enlive po TID, each supplement provides 350 kcal and 20 grams of protein.  Continue MVI, Thiamine, Folic Acid  NUTRITION DIAGNOSIS:   Inadequate oral intake related to inability to eat as evidenced by NPO status.  Being addressed via diet advancement and liberalization, oral nutrition supplements  GOAL:   Patient will meet greater than or equal to 90% of their needs  Not Met  MONITOR:   PO intake, Supplement acceptance, Labs, Weight trends  REASON FOR ASSESSMENT:   Ventilator    ASSESSMENT:   67 yo male admitted with toxic metabolic encephalopathy and acute respiratory failure due to EtOH withdrawal requiring intubation. PMH includes CAD s/p CABG, ICM, HTN, HFrEF, EtOH abuse (reportedly drinks 12 beers a day)   7/08 Admitted to [facility], Intubated 7/09 Transfer to [facility] 7/10 TF initiated 7/12 Extubated, no enteral access, TF discontinued 7/14 Diet advanced to Heart Healthy   Pt attempting to get out of bed on visit today, Girlfriend at bedside and helps with redirecting, bed alarm in place. Pt is alert, confused. Pt with mittens but no restraints.   Extubated on 7/12, Currently off HHFNC, currently on 4L [place].   NPO post extubation until diet advanced to Heart Healthy yesterday afternoon  Recorded po intake 25% of dinner last night. Girlfriend reports pt is eating but not much. Pt with mittens on hands and requires feeding assistance at this time.   Current wt 83.1 kg, admit weight mere 87 kg prior to tx. Weight 85 kg post transfer  Labs: potassium 3.0 (L) Meds: bentyl, folic acid, thiamine, MVI with Minerals, KCl   NUTRITION - FOCUSED PHYSICAL EXAM:  Unable to assess due to mentation, pt restless and constantly trying to get out of bed  Diet Order:    Diet Order             Diet Heart Room service appropriate? Yes; Fluid consistency: Thin  Diet effective now                   EDUCATION NEEDS:   Not appropriate for education at this time  Skin:  Skin Assessment: Reviewed RN Assessment  Last BM:  7/15 large type 6  Height:   Ht Readings from Last 1 Encounters:  07/03/23 6' (1.829 m)    Weight:   Wt Readings from Last 1 Encounters:  07/07/23 83.1 kg     BMI:  Body mass index is 24.85 kg/m.  Estimated Nutritional Needs:   Kcal:  2200-2400  Protein:  115-122 gr  Fluid:  > 2 liters daily   Romelle Starcher MS, RDN, LDN, CNSC Registered Dietitian 3 Clinical Nutrition RD Pager and On-Call Pager Number Located in [place]

## 2023-07-09 NOTE — Progress Notes (Signed)
NAME:  Raymond Roberson, MRN:  [id number], DOB:  1956-10-17, LOS: 7 ADMISSION DATE:  07/02/2023, CONSULTATION DATE: 07/09/23 REFERRING MD:  EDP, CHIEF COMPLAINT:  ETOH withdrawal   History of Present Illness:  Raymond Roberson is a 67 y.o. M with PMH significant for CAD s/p CABG 12/2022, Ischemic cardiomyopathy, HTN, HFrEF and ETOH abuse with reported 12 beers/day who presented to [facility] ED with two days of dizziness and shortness of breath.  He stopped drinking thinking that this would help (evening of 7/8), and then developed tremulousness and diaphoresis.   In the ED he was found to be in Afib/flutter and was treated with amiodarone.  He became increasingly agitated and required precedex and eventual intubation so was transferred to [facility]  Labs notable for flat troponin 25>25, ethanol <10, ABG 7.52/30/158/25  Pertinent  Medical History   has a past medical history of Aortic stenosis, Bicuspid aortic valve, CAD (coronary artery disease), Chronic alcohol abuse, Classic migraine (09/02/2013), CVA (cerebral vascular accident) (HCC), Diabetes mellitus without complication (HCC), GERD (gastroesophageal reflux disease), Gouty arthritis, Hypertension, Major depression, Mixed hyperlipidemia, Peptic ulcer disease, and Sleep apnea.  Significant Hospital Events: Including procedures, antibiotic start and stop dates in addition to other pertinent events   7/8 presented to [facility] in aflutter and developed worsening ETOH withdrawal requiring intubation 7/9 arrived to [facility] stable on propofol and CIWA 7/11 febrile overnight, cultured, abx started, still intermittently agitated 7/12 trial of extubation 7/15 much more awake, still confused  Interim History / Subjective:  Much more awake today Heated high flow off, now on 4L.  No physical complaints. Trying to get out of bed constantly.   Objective   Blood pressure (!) 134/116, pulse (!) 111, temperature 99.1 F (37.3 C), temperature source Axillary, resp. rate (!) 36,  height 6' (1.829 m), weight 83.1 kg, SpO2 94%.    FiO2 (%):  [35 %-50 %] 35 %   Intake/Output Summary (Last 24 hours) at 07/09/2023 0716 Last data filed at 07/09/2023 0551 Gross per 24 hour  Intake 842.23 ml  Output 4000 ml  Net -3157.77 ml   Filed Weights   07/05/23 0421 07/06/23 0441 07/07/23 0500  Weight: 84.8 kg 84.6 kg 83.1 kg   General:  Middle aged male in NAD Neuro:  Awake, Alert, agitated. Oriented to self only HEENT:  [place]/AT, No JVD noted, PERRL Cardiovascular:  Tachy, regular, no MRG, no edema.  Lungs:  Clear bilateral breath sounds Abdomen:  Soft, non-distended, non-tender.  Musculoskeletal:  No acute deformity or ROM limitation.  Skin:  Intact, MMM  Sputum cx 7/11: group B strep  Resolved Hospital Problem list     Assessment & Plan:   Toxic metabolic encephalopathy due to ETOH withdrawal; stopped drinking evening of 07/02/23; some dizziness and SOB preceded stopping drinking so the Aflutter may have preceded w/d.  Ammonia neg. - Precedex off. Still quite a bit confused and trying to get out of bed - Phenobarb taper - PRN ativan - Alcohol cessation - Thiamine/ folate IV  Acute hypoxic respiratory failure secondary to above and likely aspiration- improved (extubated 7/12) Pneumonia secondary to group B strep - continue rocephin for 5 day course - Supplemental O2. Weaned down to 4L from heated high flow this morning.   Arial fib/flutter, new onset History of CAD w/CABG (12/2022 x1 LIMA-LAD), ischemic cardiomyopathy/ HFrEF, HTN - RVR again this morning. Agitation likely a driving factor. - Continue amiodarone.  - Resume home metoprolol - Continue Lovenox for now considering his fall risk,  can add back Eliquis when he is less confused.   Dysphagia- related to encephalopathy, improved - Defer cortrak.   Iron def anemia - IV iron given. Trend H&H     Best Practice (right click and "Reselect all SmartList Selections" daily)   Diet/type: Regular consistency  (see orders); DVT prophylaxis: LMWH GI prophylaxis: PPI Lines: N/A Foley: DC Code Status:  full code Last date of multidisciplinary goals of care discussion [pending]  CC time:   Joneen Roach, AGACNP-BC [facility]  See Amion for personal pager PCCM on call pager [phone] until 7pm. Please call Elink 7p-7a. [phone]  07/09/2023 7:44 AM

## 2023-07-09 NOTE — Progress Notes (Signed)
eLink Physician-Brief Progress Note Patient Name: Raymond Roberson DOB: May 17, 1956 MRN: [id number]   Date of Service  07/09/2023  HPI/Events of Note  Diarrhea, asking for imodium. CHF. AHRF, s/p extubation status. Meds reviewed:   eICU Interventions  DC ed sennakot Imodium ordered.  Wbc count normal, suspicion for cl difficle is low for now.      Intervention Category Minor Interventions: Other:  Ranee Gosselin 07/09/2023, 12:30 AM

## 2023-07-10 ENCOUNTER — Other Ambulatory Visit (HOSPITAL_COMMUNITY): Payer: Self-pay

## 2023-07-10 DIAGNOSIS — F10931 Alcohol use, unspecified with withdrawal delirium: Secondary | ICD-10-CM | POA: Diagnosis not present

## 2023-07-10 LAB — CBC
HCT: 33.4 % — ABNORMAL LOW (ref 39.0–52.0)
Hemoglobin: 9.7 g/dL — ABNORMAL LOW (ref 13.0–17.0)
MCH: 21.4 pg — ABNORMAL LOW (ref 26.0–34.0)
MCHC: 29 g/dL — ABNORMAL LOW (ref 30.0–36.0)
MCV: 73.7 fL — ABNORMAL LOW (ref 80.0–100.0)
Platelets: 304 10*3/uL (ref 150–400)
RBC: 4.53 MIL/uL (ref 4.22–5.81)
RDW: 16 % — ABNORMAL HIGH (ref 11.5–15.5)
WBC: 7.3 10*3/uL (ref 4.0–10.5)
nRBC: 0 % (ref 0.0–0.2)

## 2023-07-10 LAB — BASIC METABOLIC PANEL
Anion gap: 8 (ref 5–15)
BUN: 9 mg/dL (ref 8–23)
CO2: 27 mmol/L (ref 22–32)
Calcium: 8.4 mg/dL — ABNORMAL LOW (ref 8.9–10.3)
Chloride: 102 mmol/L (ref 98–111)
Creatinine, Ser: 0.78 mg/dL (ref 0.61–1.24)
GFR, Estimated: >60 mL/min (ref >60)
Glucose, Bld: 78 mg/dL (ref 70–99)
Potassium: 3.1 mmol/L — ABNORMAL LOW (ref 3.5–5.1)
Sodium: 137 mmol/L (ref 135–145)

## 2023-07-10 LAB — PHOSPHORUS: Phosphorus: 3 mg/dL (ref 2.5–4.6)

## 2023-07-10 LAB — GLUCOSE, CAPILLARY
Glucose-Capillary: 95 mg/dL (ref 70–99)
Glucose-Capillary: 97 mg/dL (ref 70–99)
Glucose-Capillary: 95 mg/dL (ref 70–99)
Glucose-Capillary: 118 mg/dL — ABNORMAL HIGH (ref 70–99)

## 2023-07-10 LAB — CULTURE, BLOOD (ROUTINE X 2)
Culture: NO GROWTH
Special Requests: ADEQUATE
Special Requests: ADEQUATE

## 2023-07-10 LAB — MAGNESIUM: Magnesium: 2.2 mg/dL (ref 1.7–2.4)

## 2023-07-10 MED ORDER — THIAMINE HCL 100 MG/ML IJ SOLN
500.0000 mg | Freq: Three times a day (TID) | INTRAVENOUS | Status: DC
  Administered 2023-07-10 – 2023-07-14 (×13): 500 mg via INTRAVENOUS
  Filled 2023-07-10 (×17): qty 5

## 2023-07-10 MED ORDER — CITALOPRAM HYDROBROMIDE 20 MG PO TABS
20.0000 mg | ORAL_TABLET | Freq: Every day | ORAL | Status: DC
  Administered 2023-07-10 – 2023-07-15 (×6): 20 mg via ORAL
  Filled 2023-07-10 (×6): qty 1

## 2023-07-10 MED ORDER — PANTOPRAZOLE SODIUM 40 MG PO TBEC
40.0000 mg | DELAYED_RELEASE_TABLET | Freq: Every day | ORAL | Status: DC
  Administered 2023-07-10 – 2023-07-15 (×6): 40 mg via ORAL
  Filled 2023-07-10 (×6): qty 1

## 2023-07-10 MED ORDER — POTASSIUM CHLORIDE CRYS ER 20 MEQ PO TBCR
40.0000 meq | EXTENDED_RELEASE_TABLET | Freq: Four times a day (QID) | ORAL | Status: AC
  Administered 2023-07-10 (×2): 40 meq via ORAL
  Filled 2023-07-10 (×2): qty 2

## 2023-07-10 NOTE — TOC Benefit Eligibility Note (Signed)
Pharmacy Patient Advocate Encounter  Insurance verification completed.    The patient is insured through [facility] Medicare Part D  Ran test claim for Eliquis 5 mg and the current 30 day co-pay is $45.00.   This test claim was processed through [facility]- copay amounts may vary at other pharmacies due to pharmacy/plan contracts, or as the patient moves through the different stages of their insurance plan.    Roland Earl, CPHT Pharmacy Patient Advocate Specialist [facility] Patient Advocate Team Direct Number: [phone]  Fax: [phone]

## 2023-07-10 NOTE — Plan of Care (Signed)
  Problem: Education: Goal: Knowledge of General Education information will improve Description: Including pain rating scale, medication(s)/side effects and non-pharmacologic comfort measures Outcome: Progressing   Problem: Health Behavior/Discharge Planning: Goal: Ability to manage health-related needs will improve Outcome: Progressing   Problem: Clinical Measurements: Goal: Ability to maintain clinical measurements within normal limits will improve Outcome: Progressing Goal: Will remain free from infection Outcome: Progressing Goal: Diagnostic test results will improve Outcome: Progressing Goal: Respiratory complications will improve Outcome: Progressing Goal: Cardiovascular complication will be avoided Outcome: Progressing   Problem: Activity: Goal: Risk for activity intolerance will decrease Outcome: Progressing   Problem: Nutrition: Goal: Adequate nutrition will be maintained Outcome: Progressing   Problem: Coping: Goal: Level of anxiety will decrease Outcome: Progressing   Problem: Elimination: Goal: Will not experience complications related to bowel motility Outcome: Progressing Goal: Will not experience complications related to urinary retention Outcome: Progressing   Problem: Pain Managment: Goal: General experience of comfort will improve Outcome: Progressing   Problem: Safety: Goal: Ability to remain free from injury will improve Outcome: Progressing   Problem: Skin Integrity: Goal: Risk for impaired skin integrity will decrease Outcome: Progressing   Problem: Safety: Goal: Non-violent Restraint(s) Outcome: Progressing   Problem: Activity: Goal: Ability to tolerate increased activity will improve Outcome: Progressing   Problem: Respiratory: Goal: Ability to maintain a clear airway and adequate ventilation will improve Outcome: Progressing   Problem: Role Relationship: Goal: Method of communication will improve Outcome: Progressing    Problem: Education: Goal: Ability to describe self-care measures that may prevent or decrease complications (Diabetes Survival Skills Education) will improve Outcome: Progressing Goal: Individualized Educational Video(s) Outcome: Progressing   Problem: Coping: Goal: Ability to adjust to condition or change in health will improve Outcome: Progressing   Problem: Fluid Volume: Goal: Ability to maintain a balanced intake and output will improve Outcome: Progressing   Problem: Health Behavior/Discharge Planning: Goal: Ability to identify and utilize available resources and services will improve Outcome: Progressing Goal: Ability to manage health-related needs will improve Outcome: Progressing   Problem: Metabolic: Goal: Ability to maintain appropriate glucose levels will improve Outcome: Progressing   Problem: Nutritional: Goal: Maintenance of adequate nutrition will improve Outcome: Progressing Goal: Progress toward achieving an optimal weight will improve Outcome: Progressing   Problem: Skin Integrity: Goal: Risk for impaired skin integrity will decrease Outcome: Progressing   Problem: Tissue Perfusion: Goal: Adequacy of tissue perfusion will improve Outcome: Progressing

## 2023-07-10 NOTE — Progress Notes (Addendum)
CSW received request from MD to renew IVC for patient agitation and trying to leave. CSW submitted IVC, Envelope [id number].  CASE#: [id number]. CSW contacting [employer] to serve paperwork.   Joaquin Courts, MSW, [facility]

## 2023-07-10 NOTE — Plan of Care (Signed)
  Problem: Education: Goal: Knowledge of General Education information will improve Description: Including pain rating scale, medication(s)/side effects and non-pharmacologic comfort measures Outcome: Not Progressing   Problem: Health Behavior/Discharge Planning: Goal: Ability to manage health-related needs will improve Outcome: Not Progressing   Problem: Clinical Measurements: Goal: Ability to maintain clinical measurements within normal limits will improve Outcome: Not Progressing Goal: Will remain free from infection Outcome: Not Progressing Goal: Diagnostic test results will improve Outcome: Not Progressing Goal: Respiratory complications will improve Outcome: Not Progressing Goal: Cardiovascular complication will be avoided Outcome: Not Progressing   Problem: Activity: Goal: Risk for activity intolerance will decrease Outcome: Not Progressing   Problem: Nutrition: Goal: Adequate nutrition will be maintained Outcome: Not Progressing   Problem: Coping: Goal: Level of anxiety will decrease Outcome: Not Progressing   Problem: Elimination: Goal: Will not experience complications related to bowel motility Outcome: Not Progressing Goal: Will not experience complications related to urinary retention Outcome: Not Progressing   Problem: Pain Managment: Goal: General experience of comfort will improve Outcome: Not Progressing   Problem: Safety: Goal: Ability to remain free from injury will improve Outcome: Not Progressing   Problem: Skin Integrity: Goal: Risk for impaired skin integrity will decrease Outcome: Not Progressing   Problem: Safety: Goal: Non-violent Restraint(s) Outcome: Not Progressing   Problem: Activity: Goal: Ability to tolerate increased activity will improve Outcome: Not Progressing   Problem: Respiratory: Goal: Ability to maintain a clear airway and adequate ventilation will improve Outcome: Not Progressing   Problem: Role  Relationship: Goal: Method of communication will improve Outcome: Not Progressing   Problem: Education: Goal: Ability to describe self-care measures that may prevent or decrease complications (Diabetes Survival Skills Education) will improve Outcome: Not Progressing Goal: Individualized Educational Video(s) Outcome: Not Progressing   Problem: Coping: Goal: Ability to adjust to condition or change in health will improve Outcome: Not Progressing   Problem: Fluid Volume: Goal: Ability to maintain a balanced intake and output will improve Outcome: Not Progressing   Problem: Health Behavior/Discharge Planning: Goal: Ability to identify and utilize available resources and services will improve Outcome: Not Progressing Goal: Ability to manage health-related needs will improve Outcome: Not Progressing   Problem: Metabolic: Goal: Ability to maintain appropriate glucose levels will improve Outcome: Not Progressing   Problem: Nutritional: Goal: Maintenance of adequate nutrition will improve Outcome: Not Progressing Goal: Progress toward achieving an optimal weight will improve Outcome: Not Progressing   Problem: Skin Integrity: Goal: Risk for impaired skin integrity will decrease Outcome: Not Progressing   Problem: Tissue Perfusion: Goal: Adequacy of tissue perfusion will improve Outcome: Not Progressing

## 2023-07-10 NOTE — Progress Notes (Signed)
PROGRESS NOTE    Raymond Roberson  LKG:[id number] DOB: 1956/03/26 DOA: 07/02/2023 PCP: Richardean Chimera, MD  Chief Complaint  Patient presents with   Hypertension    Brief Narrative:    Raymond Roberson is a 67 y.o. M with PMH significant for CAD s/p CABG 12/2022, Ischemic cardiomyopathy, HTN, HFrEF and ETOH abuse with reported 12 beers/day who presented to [facility] ED with two days of dizziness and shortness of breath.  He stopped drinking thinking that this would help (evening of 7/8), and then developed tremulousness and diaphoresis.   In the ED he was found to be in Afib/flutter and was treated with amiodarone.  He became increasingly agitated and required precedex and eventual intubation so was transferred to [facility].  Labs notable for flat troponin 25>25, ethanol <10, ABG 7.52/30/158/25   7/8 presented to [facility] in aflutter and developed worsening ETOH withdrawal requiring intubation 7/9 arrived to [facility] stable on propofol and CIWA 7/11 febrile overnight, cultured, abx started, still intermittently agitated 7/12 trial of extubation 7/15 much more awake, still confused /16, transferred out of ICU under [facility] service  Assessment & Plan:   Principal Problem:   Alcohol withdrawal (HCC) Active Problems:   Atrial flutter with rapid ventricular response (HCC)   HTN (hypertension)   S/P AVR   Ischemic cardiomyopathy   S/P CABG x 1   Chronic back pain   Alcohol withdrawal with delirium (HCC)   Acute respiratory failure with hypoxia (HCC)   CAP (community acquired pneumonia) due to group B Streptococcus (HCC)   Encephalopathy acute    Toxic metabolic encephalopathy due to ETOH withdrawal -stopped drinking evening of 07/02/23; some dizziness and SOB preceded stopping drinking so the Aflutter may have preceded w/d.   - Ammonia neg. -Quired Precedex drip, has been tapered off and transferred out of ICU  - Phenobarb taper - PRN ativan - Alcohol cessation - Continue with folic acid  - Will keep on  high-dose thiamine 100 mg IV every 8 hours   Acute hypoxic respiratory failure secondary to above and likely aspiration- improved (extubated 7/12) Pneumonia -  secondary to group B strep - continue rocephin for 5 day course - Supplemental O2. Weaned down to 4L from heated high flow this morning.    Arial fib/flutter, new onset History of CAD w/CABG (12/2022 x1 LIMA-LAD), ischemic cardiomyopathy/ HFrEF, HTN - RVR again this morning. Agitation likely a driving factor. - Continue amiodarone.  - Resume home metoprolol - Continue Lovenox for now considering his fall risk, can add back Eliquis when he is less confused.    Dysphagia - related to encephalopathy, improved - Defer cortrak.  Currently on regular diet   Iron def anemia - IV iron given. Trend H&H  Hypokalemia - repleted      DVT prophylaxis: Lovenox Code Status: Full Family Communication: D/W Fiance at bedside Disposition:   Status is: Inpatient    Consultants:  PCCM   Subjective:  Mains confused, agitated and restless, with bedside sitter .  Objective: Vitals:   07/10/23 0738 07/10/23 0815 07/10/23 0855 07/10/23 1056  BP:  100/89 (!) 136/111 96/79  Pulse:  (!) 123 (!) 134 (!) 136  Resp:  (!) 30  (!) 24  Temp: 98.8 F (37.1 C) 98.8 F (37.1 C)    TempSrc: Oral Oral    SpO2:  91%  93%  Weight:      Height:        Intake/Output Summary (Last 24 hours) at 07/10/2023 1142 Last data  filed at 07/10/2023 0900 Gross per 24 hour  Intake 348.87 ml  Output 875 ml  Net -526.13 ml   Filed Weights   07/05/23 0421 07/06/23 0441 07/07/23 0500  Weight: 84.8 kg 84.6 kg 83.1 kg    Examination:  Wakes up, significantly confused, restless Symmetrical Chest wall movement, Good air movement bilaterally, CTAB irregular no Gallops,Rubs or new Murmurs, No Parasternal Heave +ve B.Sounds, Abd Soft, No tenderness, No rebound - guarding or rigidity. No Cyanosis, Clubbing or edema, No new Rash or bruise      Data  Reviewed: I have personally reviewed following labs and imaging studies  CBC: Recent Labs  Lab 07/04/23 0225 07/05/23 0645 07/06/23 0732 07/07/23 0110 07/08/23 0838 07/10/23 0210  WBC 8.6 11.1* 10.7* 8.4  --  7.3  HGB 10.8* 11.5* 10.5* 8.8* 10.2* 9.7*  HCT 35.7* 39.3 36.3* 30.7* 30.0* 33.4*  MCV 74.8* 73.9* 74.4* 74.5*  --  73.7*  PLT 237 219 225 215  --  304    Basic Metabolic Panel: Recent Labs  Lab 07/05/23 0645 07/05/23 1737 07/06/23 0732 07/07/23 0110 07/08/23 0036 07/08/23 0838 07/09/23 0140 07/10/23 0210  NA 135  --  134* 137 139 138 135 137  K 3.7  --  3.9 3.6 3.9 3.8 3.0* 3.1*  CL 100  --  102 104 98  --  101 102  CO2 20*  --  23 25 26   --  24 27  GLUCOSE 79  --  125* 109* 113*  --  86 78  BUN 11  --  11 12 14   --  11 9  CREATININE 0.99  --  0.88 0.87 0.81  --  0.79 0.78  CALCIUM 8.5*  --  8.3* 8.2* 9.0  --  8.8* 8.4*  MG 2.3 2.1 2.3 2.2 2.3  --  2.4 2.2  PHOS 5.0*  5.1* 4.3 3.0  3.2 2.9  --   --   --  3.0    GFR: Estimated Creatinine Clearance: 102.4 mL/min (by C-G formula based on SCr of 0.78 mg/dL).  Liver Function Tests: Recent Labs  Lab 07/05/23 0645 07/06/23 0732 07/07/23 0110  ALBUMIN 2.9* 2.5* 2.5*    CBG: Recent Labs  Lab 07/09/23 1200 07/09/23 1600 07/09/23 1938 07/10/23 0834 07/10/23 1055  GLUCAP 90 86 110* 95 97     Recent Results (from the past 240 hour(s))  MRSA Next Gen by PCR, Nasal     Status: None   Collection Time: 07/02/23  6:48 PM   Specimen: Nasal Mucosa; Nasal Swab  Result Value Ref Range Status   MRSA by PCR Next Gen NOT DETECTED NOT DETECTED Final    Comment: (NOTE) The GeneXpert MRSA Assay (FDA approved for NASAL specimens only), is one component of a comprehensive MRSA colonization surveillance program. It is not intended to diagnose MRSA infection nor to guide or monitor treatment for MRSA infections. Test performance is not FDA approved in patients less than 23 years old. Performed at [facility], [address], [place], [place] [address]   Culture, Respiratory w Gram Stain     Status: None   Collection Time: 07/05/23  7:24 AM   Specimen: Tracheal Aspirate; Respiratory  Result Value Ref Range Status   Specimen Description TRACHEAL ASPIRATE  Final   Special Requests NONE  Final   Gram Stain   Final    ABUNDANT WBC PRESENT, PREDOMINANTLY PMN ABUNDANT GRAM POSITIVE COCCI IN PAIRS MODERATE GRAM NEGATIVE RODS FEW GRAM POSITIVE RODS  Culture   Final    ABUNDANT GROUP B STREP(S.AGALACTIAE)ISOLATED TESTING AGAINST S. AGALACTIAE NOT ROUTINELY PERFORMED DUE TO PREDICTABILITY OF AMP/PEN/VAN SUSCEPTIBILITY. WITHIN MIXED NORMAL RESPIRATORY FLORA Performed at [facility], [address] [place], [place] [address]    Report Status 07/08/2023 FINAL  Final  Culture, blood (Routine X 2) w Reflex to ID Panel     Status: None   Collection Time: 07/05/23  9:29 AM   Specimen: BLOOD LEFT HAND  Result Value Ref Range Status   Specimen Description BLOOD LEFT HAND  Final   Special Requests   Final    BOTTLES DRAWN AEROBIC AND ANAEROBIC Blood Culture adequate volume   Culture   Final    NO GROWTH 5 DAYS Performed at [facility], [address], [place], [place] [address]    Report Status 07/10/2023 FINAL  Final  Culture, blood (Routine X 2) w Reflex to ID Panel     Status: None   Collection Time: 07/05/23  9:29 AM   Specimen: BLOOD LEFT HAND  Result Value Ref Range Status   Specimen Description BLOOD LEFT HAND  Final   Special Requests   Final    BOTTLES DRAWN AEROBIC AND ANAEROBIC Blood Culture adequate volume   Culture   Final    NO GROWTH 5 DAYS Performed at [facility], [address], [place], [place] [address]    Report Status 07/10/2023 FINAL  Final         Radiology Studies: No results found.      Scheduled Meds:  amiodarone  200 mg Oral Daily   aspirin  81 mg Oral Daily   atorvastatin  80 mg Oral QHS   Chlorhexidine Gluconate Cloth  6 each  Topical Daily   cloNIDine  0.1 mg Oral QID   Followed by   [name] ON 07/11/2023] cloNIDine  0.1 mg Oral BH-qamhs   Followed by   [name] ON 07/13/2023] cloNIDine  0.1 mg Oral QAC breakfast   enoxaparin (LOVENOX) injection  80 mg Subcutaneous Q12H   feeding supplement  237 mL Oral TID BM   folic acid  1 mg Oral Daily   metoprolol tartrate  12.5 mg Oral BID   multivitamin with minerals  1 tablet Oral Daily   pantoprazole  40 mg Oral Daily   PHENObarbital  32.5 mg Intravenous Q8H   potassium chloride  40 mEq Oral Q6H   Continuous Infusions:  sodium chloride Stopped (07/08/23 1918)   cefTRIAXone (ROCEPHIN)  IV Stopped (07/09/23 1437)   thiamine (VITAMIN B1) injection       LOS: 8 days      Huey Bienenstock, MD [facility]   To contact the attending provider between 7A-7P or the covering provider during after hours 7P-7A, please log into the web site [email or web address] and access using universal [place] password for that web site. If you do not have the password, please call the hospital operator.  07/10/2023, 11:42 AM

## 2023-07-10 NOTE — Care Management Important Message (Signed)
Important Message  Patient Details  Name: Raymond Roberson MRN: [id number] Date of Birth: May 23, 1956   Medicare Important Message Given:  Yes     Dorena Bodo 07/10/2023, 2:29 PM

## 2023-07-11 ENCOUNTER — Inpatient Hospital Stay: Payer: Medicare HMO

## 2023-07-11 DIAGNOSIS — F10931 Alcohol use, unspecified with withdrawal delirium: Secondary | ICD-10-CM | POA: Diagnosis not present

## 2023-07-11 DIAGNOSIS — I4892 Unspecified atrial flutter: Secondary | ICD-10-CM | POA: Diagnosis not present

## 2023-07-11 LAB — CBC
HCT: 33.8 % — ABNORMAL LOW (ref 39.0–52.0)
Hemoglobin: 9.9 g/dL — ABNORMAL LOW (ref 13.0–17.0)
MCH: 22.1 pg — ABNORMAL LOW (ref 26.0–34.0)
MCHC: 29.3 g/dL — ABNORMAL LOW (ref 30.0–36.0)
MCV: 75.4 fL — ABNORMAL LOW (ref 80.0–100.0)
Platelets: 315 10*3/uL (ref 150–400)
RBC: 4.48 MIL/uL (ref 4.22–5.81)
RDW: 15.9 % — ABNORMAL HIGH (ref 11.5–15.5)
WBC: 7.4 10*3/uL (ref 4.0–10.5)
nRBC: 0 % (ref 0.0–0.2)

## 2023-07-11 LAB — MAGNESIUM: Magnesium: 2.3 mg/dL (ref 1.7–2.4)

## 2023-07-11 LAB — GLUCOSE, CAPILLARY
Glucose-Capillary: 127 mg/dL — ABNORMAL HIGH (ref 70–99)
Glucose-Capillary: 154 mg/dL — ABNORMAL HIGH (ref 70–99)
Glucose-Capillary: 68 mg/dL — ABNORMAL LOW (ref 70–99)
Glucose-Capillary: 75 mg/dL (ref 70–99)
Glucose-Capillary: 87 mg/dL (ref 70–99)
Glucose-Capillary: 88 mg/dL (ref 70–99)

## 2023-07-11 LAB — BASIC METABOLIC PANEL
Anion gap: 7 (ref 5–15)
BUN: 7 mg/dL — ABNORMAL LOW (ref 8–23)
CO2: 24 mmol/L (ref 22–32)
Calcium: 8.4 mg/dL — ABNORMAL LOW (ref 8.9–10.3)
Chloride: 105 mmol/L (ref 98–111)
Creatinine, Ser: 0.82 mg/dL (ref 0.61–1.24)
GFR, Estimated: >60 mL/min (ref >60)
Glucose, Bld: 85 mg/dL (ref 70–99)
Potassium: 3.7 mmol/L (ref 3.5–5.1)
Sodium: 136 mmol/L (ref 135–145)

## 2023-07-11 LAB — PHOSPHORUS: Phosphorus: 3.2 mg/dL (ref 2.5–4.6)

## 2023-07-11 MED ORDER — AMIODARONE HCL 200 MG PO TABS
400.0000 mg | ORAL_TABLET | Freq: Two times a day (BID) | ORAL | Status: AC
  Administered 2023-07-11 – 2023-07-14 (×6): 400 mg via ORAL
  Filled 2023-07-11 (×6): qty 2

## 2023-07-11 MED ORDER — QUETIAPINE FUMARATE 25 MG PO TABS: 25.0000 mg | ORAL_TABLET | Freq: Every day | ORAL | Status: DC

## 2023-07-11 MED ORDER — NICOTINE 21 MG/24HR TD PT24
21.0000 mg | MEDICATED_PATCH | Freq: Every day | TRANSDERMAL | Status: DC
  Administered 2023-07-11 – 2023-07-13 (×3): 21 mg via TRANSDERMAL
  Filled 2023-07-11 (×4): qty 1

## 2023-07-11 MED ORDER — AMIODARONE HCL 200 MG PO TABS
200.0000 mg | ORAL_TABLET | Freq: Every day | ORAL | Status: DC
  Administered 2023-07-15: 200 mg via ORAL
  Filled 2023-07-11: qty 1

## 2023-07-11 MED ORDER — QUETIAPINE FUMARATE 50 MG PO TABS: 50.0000 mg | ORAL_TABLET | Freq: Every day | ORAL | Status: DC

## 2023-07-11 NOTE — Plan of Care (Signed)
  Problem: Clinical Measurements: Goal: Will remain free from infection Outcome: Progressing Goal: Diagnostic test results will improve Outcome: Progressing Goal: Respiratory complications will improve Outcome: Progressing Goal: Cardiovascular complication will be avoided Outcome: Progressing   Problem: Nutrition: Goal: Adequate nutrition will be maintained Outcome: Progressing   Problem: Elimination: Goal: Will not experience complications related to bowel motility Outcome: Progressing Goal: Will not experience complications related to urinary retention Outcome: Progressing   Problem: Pain Managment: Goal: General experience of comfort will improve Outcome: Progressing   Problem: Safety: Goal: Ability to remain free from injury will improve Outcome: Progressing   Problem: Skin Integrity: Goal: Risk for impaired skin integrity will decrease Outcome: Progressing   Problem: Activity: Goal: Ability to tolerate increased activity will improve Outcome: Progressing   Problem: Respiratory: Goal: Ability to maintain a clear airway and adequate ventilation will improve Outcome: Progressing   Problem: Role Relationship: Goal: Method of communication will improve Outcome: Progressing   Problem: Fluid Volume: Goal: Ability to maintain a balanced intake and output will improve Outcome: Progressing   Problem: Metabolic: Goal: Ability to maintain appropriate glucose levels will improve Outcome: Progressing   Problem: Nutritional: Goal: Maintenance of adequate nutrition will improve Outcome: Progressing Goal: Progress toward achieving an optimal weight will improve Outcome: Progressing   Problem: Skin Integrity: Goal: Risk for impaired skin integrity will decrease Outcome: Progressing   Problem: Tissue Perfusion: Goal: Adequacy of tissue perfusion will improve Outcome: Progressing

## 2023-07-11 NOTE — Progress Notes (Signed)
PROGRESS NOTE    Raymond Roberson  JOA:[id number] DOB: 03-12-1956 DOA: 07/02/2023 PCP: Richardean Chimera, MD  Chief Complaint  Patient presents with   Hypertension    Brief Narrative:    Raymond Roberson is a 67 y.o. M with PMH significant for CAD s/p CABG 12/2022, Ischemic cardiomyopathy, HTN, HFrEF and ETOH abuse with reported 12 beers/day who presented to [facility] with two days of dizziness and shortness of breath.  He stopped drinking thinking that this would help (evening of 7/8), and then developed tremulousness and diaphoresis.   In the ED he was found to be in Afib/flutter and was treated with amiodarone.  He became increasingly agitated and required precedex and eventual intubation so was transferred to [facility].  Labs notable for flat troponin 25>25, ethanol <10, ABG 7.52/30/158/25   7/8 presented to [facility] in aflutter and developed worsening ETOH withdrawal requiring intubation 7/9 arrived to [facility] stable on propofol and CIWA 7/11 febrile overnight, cultured, abx started, still intermittently agitated 7/12 trial of extubation 7/15 much more awake, still confused 7/16, transferred out of ICU under [facility] service  Assessment & Plan:   Principal Problem:   Alcohol withdrawal (HCC) Active Problems:   Atrial flutter with rapid ventricular response (HCC)   HTN (hypertension)   S/P AVR   Ischemic cardiomyopathy   S/P CABG x 1   Chronic back pain   Alcohol withdrawal with delirium (HCC)   Acute respiratory failure with hypoxia (HCC)   CAP (community acquired pneumonia) due to group B Streptococcus (HCC)   Encephalopathy acute    Acute Toxic metabolic encephalopathy due to ETOH withdrawal -stopped drinking evening of 07/02/23; some dizziness and SOB preceded stopping drinking so the Aflutter may have preceded w/d.   - Ammonia neg. -Required Precedex drip, has been tapered off and transferred out of ICU  - continue with Phenobarb taper - PRN ativan - Alcohol cessation - Continue with folic  acid  -High-dose IV thiamine, -Likely started to develop some hospital delirium, will start on low-dose Seroquel   Acute hypoxic respiratory failure secondary to above and likely aspiration- improved (extubated 7/12) Pneumonia -  secondary to group B strep - finished  rocephin 5 day course - Supplemental O2. Remains on 4 L oxygen via nasal cannula   Arial fib/flutter, new onset History of CAD w/CABG (12/2022 x1 LIMA-LAD), ischemic cardiomyopathy/ HFrEF, HTN -Heart rate is elevated today, continue with amiodarone, will increase Toprol all if blood pressure allows. - Continue amiodarone.  - Resume home metoprolol - Continue Lovenox for now considering his fall risk, can add back Eliquis when he is less confused.    Dysphagia - related to encephalopathy, improved - Defer cortrak.  Currently on regular diet   Iron def anemia - IV iron given. Trend H&H  Hypokalemia - repleted      DVT prophylaxis: Lovenox Code Status: Full Family Communication: D/W Fiance at bedside Disposition:   Status is: Inpatient    Consultants:  PCCM   Subjective:  Is confused, had some agitation overnight, remains with bedside sitter.  Objective: Vitals:   07/11/23 0520 07/11/23 0600 07/11/23 0610 07/11/23 0922  BP:      Pulse: 98 (!) 103 92   Resp: 20 18 18    Temp:    98.2 F (36.8 C)  TempSrc:    Oral  SpO2: 96% 96% 97%   Weight:      Height:        Intake/Output Summary (Last 24 hours) at 07/11/2023 1111  Last data filed at 07/11/2023 0930 Gross per 24 hour  Intake 1710 ml  Output 850 ml  Net 860 ml   Filed Weights   07/05/23 0421 07/06/23 0441 07/07/23 0500  Weight: 84.8 kg 84.6 kg 83.1 kg    Examination:  Frail, chronically ill-appearing, appears older than stated age, currently NAD Symmetrical Chest wall movement, Good air movement bilaterally, CTAB RRR,No Gallops,Rubs or new Murmurs, No Parasternal Heave +ve B.Sounds, Abd Soft, No tenderness, No rebound - guarding or  rigidity. No Cyanosis, Clubbing or edema, No new Rash or bruise     Data Reviewed: I have personally reviewed following labs and imaging studies  CBC: Recent Labs  Lab 07/05/23 0645 07/06/23 0732 07/07/23 0110 07/08/23 0838 07/10/23 0210 07/11/23 0545  WBC 11.1* 10.7* 8.4  --  7.3 7.4  HGB 11.5* 10.5* 8.8* 10.2* 9.7* 9.9*  HCT 39.3 36.3* 30.7* 30.0* 33.4* 33.8*  MCV 73.9* 74.4* 74.5*  --  73.7* 75.4*  PLT 219 225 215  --  304 315    Basic Metabolic Panel: Recent Labs  Lab 07/05/23 1737 07/06/23 0732 07/07/23 0110 07/08/23 0036 07/08/23 0838 07/09/23 0140 07/10/23 0210 07/11/23 0545  NA  --  134* 137 139 138 135 137 136  K  --  3.9 3.6 3.9 3.8 3.0* 3.1* 3.7  CL  --  102 104 98  --  101 102 105  CO2  --  23 25 26   --  24 27 24   GLUCOSE  --  125* 109* 113*  --  86 78 85  BUN  --  11 12 14   --  11 9 7*  CREATININE  --  0.88 0.87 0.81  --  0.79 0.78 0.82  CALCIUM  --  8.3* 8.2* 9.0  --  8.8* 8.4* 8.4*  MG 2.1 2.3 2.2 2.3  --  2.4 2.2 2.3  PHOS 4.3 3.0  3.2 2.9  --   --   --  3.0 3.2    GFR: Estimated Creatinine Clearance: 99.9 mL/min (by C-G formula based on SCr of 0.82 mg/dL).  Liver Function Tests: Recent Labs  Lab 07/05/23 0645 07/06/23 0732 07/07/23 0110  ALBUMIN 2.9* 2.5* 2.5*    CBG: Recent Labs  Lab 07/10/23 1916 07/11/23 0019 07/11/23 0411 07/11/23 0856 07/11/23 1002  GLUCAP 118* 88 87 68* 154*     Recent Results (from the past 240 hour(s))  MRSA Next Gen by PCR, Nasal     Status: None   Collection Time: 07/02/23  6:48 PM   Specimen: Nasal Mucosa; Nasal Swab  Result Value Ref Range Status   MRSA by PCR Next Gen NOT DETECTED NOT DETECTED Final    Comment: (NOTE) The GeneXpert MRSA Assay (FDA approved for NASAL specimens only), is one component of a comprehensive MRSA colonization surveillance program. It is not intended to diagnose MRSA infection nor to guide or monitor treatment for MRSA infections. Test performance is not FDA  approved in patients less than 73 years old. Performed at [facility], [address] [place], [place] [address]   Culture, Respiratory w Gram Stain     Status: None   Collection Time: 07/05/23  7:24 AM   Specimen: Tracheal Aspirate; Respiratory  Result Value Ref Range Status   Specimen Description TRACHEAL ASPIRATE  Final   Special Requests NONE  Final   Gram Stain   Final    ABUNDANT WBC PRESENT, PREDOMINANTLY PMN ABUNDANT GRAM POSITIVE COCCI IN PAIRS MODERATE GRAM NEGATIVE RODS FEW  GRAM POSITIVE RODS    Culture   Final    ABUNDANT GROUP B STREP(S.AGALACTIAE)ISOLATED TESTING AGAINST S. AGALACTIAE NOT ROUTINELY PERFORMED DUE TO PREDICTABILITY OF AMP/PEN/VAN SUSCEPTIBILITY. WITHIN MIXED NORMAL RESPIRATORY FLORA Performed at [facility], [address] [place], [place] [address]    Report Status 07/08/2023 FINAL  Final  Culture, blood (Routine X 2) w Reflex to ID Panel     Status: None   Collection Time: 07/05/23  9:29 AM   Specimen: BLOOD LEFT HAND  Result Value Ref Range Status   Specimen Description BLOOD LEFT HAND  Final   Special Requests   Final    BOTTLES DRAWN AEROBIC AND ANAEROBIC Blood Culture adequate volume   Culture   Final    NO GROWTH 5 DAYS Performed at [facility], [address], [place], [place] [address]    Report Status 07/10/2023 FINAL  Final  Culture, blood (Routine X 2) w Reflex to ID Panel     Status: None   Collection Time: 07/05/23  9:29 AM   Specimen: BLOOD LEFT HAND  Result Value Ref Range Status   Specimen Description BLOOD LEFT HAND  Final   Special Requests   Final    BOTTLES DRAWN AEROBIC AND ANAEROBIC Blood Culture adequate volume   Culture   Final    NO GROWTH 5 DAYS Performed at [facility], [address], [place], [place] [address]    Report Status 07/10/2023 FINAL  Final         Radiology Studies: No results found.      Scheduled Meds:  amiodarone  200 mg Oral Daily   aspirin  81 mg Oral Daily    atorvastatin  80 mg Oral QHS   Chlorhexidine Gluconate Cloth  6 each Topical Daily   citalopram  20 mg Oral Daily   cloNIDine  0.1 mg Oral BH-qamhs   Followed by   [name] ON 07/13/2023] cloNIDine  0.1 mg Oral QAC breakfast   enoxaparin (LOVENOX) injection  80 mg Subcutaneous Q12H   feeding supplement  237 mL Oral TID BM   folic acid  1 mg Oral Daily   metoprolol tartrate  12.5 mg Oral BID   multivitamin with minerals  1 tablet Oral Daily   pantoprazole  40 mg Oral Daily   PHENObarbital  32.5 mg Intravenous Q8H   Continuous Infusions:  sodium chloride Stopped (07/08/23 1918)   thiamine (VITAMIN B1) injection Stopped (07/11/23 0629)     LOS: 9 days      Huey Bienenstock, MD [facility]   To contact the attending provider between 7A-7P or the covering provider during after hours 7P-7A, please log into the web site [email or web address] and access using universal [place] password for that web site. If you do not have the password, please call the hospital operator.  07/11/2023, 11:11 AM

## 2023-07-12 ENCOUNTER — Telehealth: Payer: Self-pay | Admitting: Cardiology

## 2023-07-12 ENCOUNTER — Inpatient Hospital Stay (HOSPITAL_COMMUNITY): Payer: Medicare HMO

## 2023-07-12 DIAGNOSIS — F10931 Alcohol use, unspecified with withdrawal delirium: Secondary | ICD-10-CM | POA: Diagnosis not present

## 2023-07-12 DIAGNOSIS — I4892 Unspecified atrial flutter: Secondary | ICD-10-CM | POA: Diagnosis not present

## 2023-07-12 LAB — BASIC METABOLIC PANEL
Anion gap: 7 (ref 5–15)
BUN: 7 mg/dL — ABNORMAL LOW (ref 8–23)
CO2: 24 mmol/L (ref 22–32)
Calcium: 8.3 mg/dL — ABNORMAL LOW (ref 8.9–10.3)
Chloride: 105 mmol/L (ref 98–111)
Creatinine, Ser: 0.8 mg/dL (ref 0.61–1.24)
GFR, Estimated: >60 mL/min (ref >60)
Glucose, Bld: 86 mg/dL (ref 70–99)
Potassium: 3.8 mmol/L (ref 3.5–5.1)
Sodium: 136 mmol/L (ref 135–145)

## 2023-07-12 LAB — CBC
HCT: 35 % — ABNORMAL LOW (ref 39.0–52.0)
Hemoglobin: 10.1 g/dL — ABNORMAL LOW (ref 13.0–17.0)
MCH: 21.9 pg — ABNORMAL LOW (ref 26.0–34.0)
MCHC: 28.9 g/dL — ABNORMAL LOW (ref 30.0–36.0)
MCV: 75.9 fL — ABNORMAL LOW (ref 80.0–100.0)
Platelets: 367 10*3/uL (ref 150–400)
RBC: 4.61 MIL/uL (ref 4.22–5.81)
RDW: 17.3 % — ABNORMAL HIGH (ref 11.5–15.5)
WBC: 8.4 10*3/uL (ref 4.0–10.5)
nRBC: 0 % (ref 0.0–0.2)

## 2023-07-12 LAB — GLUCOSE, CAPILLARY
Glucose-Capillary: 86 mg/dL (ref 70–99)
Glucose-Capillary: 159 mg/dL — ABNORMAL HIGH (ref 70–99)
Glucose-Capillary: 112 mg/dL — ABNORMAL HIGH (ref 70–99)
Glucose-Capillary: 93 mg/dL (ref 70–99)
Glucose-Capillary: 91 mg/dL (ref 70–99)

## 2023-07-12 LAB — PHOSPHORUS: Phosphorus: 3.2 mg/dL (ref 2.5–4.6)

## 2023-07-12 LAB — MAGNESIUM: Magnesium: 2.5 mg/dL — ABNORMAL HIGH (ref 1.7–2.4)

## 2023-07-12 MED ORDER — DILTIAZEM HCL 25 MG/5ML IV SOLN
10.0000 mg | Freq: Once | INTRAVENOUS | Status: AC
  Administered 2023-07-12: 10 mg via INTRAVENOUS
  Filled 2023-07-12: qty 5

## 2023-07-12 MED ORDER — METOPROLOL TARTRATE 25 MG PO TABS: 25.0000 mg | ORAL_TABLET | Freq: Two times a day (BID) | ORAL | Status: DC

## 2023-07-12 MED ORDER — APIXABAN 5 MG PO TABS
5.0000 mg | ORAL_TABLET | Freq: Two times a day (BID) | ORAL | Status: DC
  Administered 2023-07-12 – 2023-07-13 (×2): 5 mg
  Filled 2023-07-12 (×2): qty 1

## 2023-07-12 MED ORDER — METOPROLOL TARTRATE 50 MG PO TABS
50.0000 mg | ORAL_TABLET | Freq: Two times a day (BID) | ORAL | Status: DC
  Administered 2023-07-12 – 2023-07-15 (×6): 50 mg via ORAL
  Filled 2023-07-12 (×6): qty 1

## 2023-07-12 NOTE — Progress Notes (Signed)
PROGRESS NOTE    ROPER TOLSON  MVH:[id number] DOB: March 30, 1956 DOA: 07/02/2023 PCP: Richardean Chimera, MD  Chief Complaint  Patient presents with   Hypertension    Brief Narrative:    Raymond Roberson is a 67 y.o. M with PMH significant for CAD s/p CABG 12/2022, Ischemic cardiomyopathy, HTN, HFrEF and ETOH abuse with reported 12 beers/day who presented to [facility] with two days of dizziness and shortness of breath.  He stopped drinking thinking that this would help (evening of 7/8), and then developed tremulousness and diaphoresis.   In the ED he was found to be in Afib/flutter and was treated with amiodarone.  He became increasingly agitated and required precedex and eventual intubation so was transferred to [facility].  Labs notable for flat troponin 25>25, ethanol <10, ABG 7.52/30/158/25   7/8 presented to [facility] in aflutter and developed worsening ETOH withdrawal requiring intubation 7/9 arrived to [facility] stable on propofol and CIWA 7/11 febrile overnight, cultured, abx started, still intermittently agitated 7/12 trial of extubation 7/15 much more awake, still confused 7/16, transferred out of ICU under [facility] service 7/18 mentation much improved, IVC discontinued  Assessment & Plan:   Principal Problem:   Alcohol withdrawal (HCC) Active Problems:   Atrial flutter with rapid ventricular response (HCC)   HTN (hypertension)   S/P AVR   Ischemic cardiomyopathy   S/P CABG x 1   Chronic back pain   Alcohol withdrawal with delirium (HCC)   Acute respiratory failure with hypoxia (HCC)   CAP (community acquired pneumonia) due to group B Streptococcus (HCC)   Encephalopathy acute    Acute Toxic metabolic encephalopathy due to ETOH withdrawal -stopped drinking evening of 07/02/23; some dizziness and SOB preceded stopping drinking so the Aflutter may have preceded w/d.   - Ammonia neg. -Required Precedex drip, has been tapered off and transferred out of ICU  - continue with Phenobarb taper - PRN  ativan - Alcohol cessation - Continue with folic acid  -High-dose IV thiamine, -He finished phenobarbital taper -Severe alcohol withdrawal with delirium, he was IVC does he try to leave multiple times, mentation much improved, back to baseline today, IVC will be discontinued.   Acute hypoxic respiratory failure secondary to above and likely aspiration- improved (extubated 7/12) Pneumonia -  secondary to group B strep - finished  rocephin 5 day course - Supplemental O2. Remains on 4 L oxygen via nasal cannula   Arial fib/flutter, new onset History of CAD w/CABG (12/2022 x1 LIMA-LAD), ischemic cardiomyopathy/ HFrEF, HTN -Heart rate fluctuates widely, on amiodarone drip, transition to p.o. amiodarone, blood pressure remains soft, will try to increase his metoprolol 50 mg p.o. twice daily if tolerates, . -Required IV Cardizem x 2 today for better heart rate control as has been sustaining 130 overnight and this morning -  -Will request cardiology input as unsure if he is appropriate candidate for amiodarone. -He is on full anticoagulation with Lovenox, will transition to Eliquis now he is more appropriate.  Dysphagia - related to encephalopathy, improved - Defer cortrak.  Currently on regular diet   Iron def anemia - IV iron given. Trend H&H  Hypokalemia - repleted    Will obtain right shoulder x-ray given right shoulder pain.  DVT prophylaxis: Lovenox> Eliquis Code Status: Full Family Communication: D/W Fiance at bedside Disposition:   Status is: Inpatient    Consultants:  PCCM Cardiology   Subjective:  Mentation much improved, he had a good day yesterday, was sitting on a chair all day,  and this morning is awake alert and with it, appropriate, reports right shoulder pain.  Asking when he can go home.  Objective: Vitals:   07/12/23 0500 07/12/23 0600 07/12/23 0749 07/12/23 1100  BP:      Pulse:   94   Resp: 18 17  (!) 22  Temp:      TempSrc:   Oral Oral  SpO2:    95% 96%  Weight:      Height:        Intake/Output Summary (Last 24 hours) at 07/12/2023 1325 Last data filed at 07/12/2023 1317 Gross per 24 hour  Intake 1800 ml  Output 2475 ml  Net -675 ml   Filed Weights   07/05/23 0421 07/06/23 0441 07/07/23 0500  Weight: 84.8 kg 84.6 kg 83.1 kg    Examination:  Frail, chronically ill-appearing, appears older than stated age, currently NAD, he is awake, alert and appropriate Symmetrical Chest wall movement, Good air movement bilaterally, CTAB Irregular,No Gallops,Rubs or new Murmurs, No Parasternal Heave +ve B.Sounds, Abd Soft, No tenderness, No rebound - guarding or rigidity. No Cyanosis, Clubbing or edema, No new Rash or bruise     Data Reviewed: I have personally reviewed following labs and imaging studies  CBC: Recent Labs  Lab 07/06/23 0732 07/07/23 0110 07/08/23 0838 07/10/23 0210 07/11/23 0545 07/12/23 0347  WBC 10.7* 8.4  --  7.3 7.4 8.4  HGB 10.5* 8.8* 10.2* 9.7* 9.9* 10.1*  HCT 36.3* 30.7* 30.0* 33.4* 33.8* 35.0*  MCV 74.4* 74.5*  --  73.7* 75.4* 75.9*  PLT 225 215  --  304 315 367    Basic Metabolic Panel: Recent Labs  Lab 07/06/23 0732 07/07/23 0110 07/08/23 0036 07/08/23 0838 07/09/23 0140 07/10/23 0210 07/11/23 0545 07/12/23 0347  NA 134* 137 139 138 135 137 136 136  K 3.9 3.6 3.9 3.8 3.0* 3.1* 3.7 3.8  CL 102 104 98  --  101 102 105 105  CO2 23 25 26   --  24 27 24 24   GLUCOSE 125* 109* 113*  --  86 78 85 86  BUN 11 12 14   --  11 9 7* 7*  CREATININE 0.88 0.87 0.81  --  0.79 0.78 0.82 0.80  CALCIUM 8.3* 8.2* 9.0  --  8.8* 8.4* 8.4* 8.3*  MG 2.3 2.2 2.3  --  2.4 2.2 2.3 2.5*  PHOS 3.0  3.2 2.9  --   --   --  3.0 3.2 3.2    GFR: Estimated Creatinine Clearance: 102.4 mL/min (by C-G formula based on SCr of 0.8 mg/dL).  Liver Function Tests: Recent Labs  Lab 07/06/23 0732 07/07/23 0110  ALBUMIN 2.5* 2.5*    CBG: Recent Labs  Lab 07/11/23 1002 07/11/23 1212 07/11/23 1620 07/12/23 0749  07/12/23 1102  GLUCAP 154* 127* 75 86 159*     Recent Results (from the past 240 hour(s))  MRSA Next Gen by PCR, Nasal     Status: None   Collection Time: 07/02/23  6:48 PM   Specimen: Nasal Mucosa; Nasal Swab  Result Value Ref Range Status   MRSA by PCR Next Gen NOT DETECTED NOT DETECTED Final    Comment: (NOTE) The GeneXpert MRSA Assay (FDA approved for NASAL specimens only), is one component of a comprehensive MRSA colonization surveillance program. It is not intended to diagnose MRSA infection nor to guide or monitor treatment for MRSA infections. Test performance is not FDA approved in patients less than 22 years old. Performed at [facility], [address], [place], [place] [address]   Culture, Respiratory w Gram Stain     Status: None   Collection Time: 07/05/23  7:24 AM   Specimen: Tracheal Aspirate; Respiratory  Result Value Ref Range Status   Specimen Description TRACHEAL ASPIRATE  Final   Special Requests NONE  Final   Gram Stain   Final    ABUNDANT WBC PRESENT, PREDOMINANTLY PMN ABUNDANT GRAM POSITIVE COCCI IN PAIRS MODERATE GRAM NEGATIVE RODS FEW GRAM POSITIVE RODS    Culture   Final    ABUNDANT GROUP B STREP(S.AGALACTIAE)ISOLATED TESTING AGAINST S. AGALACTIAE NOT ROUTINELY PERFORMED DUE TO PREDICTABILITY OF AMP/PEN/VAN SUSCEPTIBILITY. WITHIN MIXED NORMAL RESPIRATORY FLORA Performed at [facility], [address] [place], [place] [address]    Report Status 07/08/2023 FINAL  Final  Culture, blood (Routine X 2) w Reflex to ID Panel     Status: None   Collection Time: 07/05/23  9:29 AM   Specimen: BLOOD LEFT HAND  Result Value Ref Range Status   Specimen Description BLOOD LEFT HAND  Final   Special Requests   Final    BOTTLES DRAWN AEROBIC AND ANAEROBIC Blood Culture adequate volume   Culture   Final    NO GROWTH 5 DAYS Performed at [facility], [address], [place], [place] [address]    Report Status 07/10/2023 FINAL  Final  Culture,  blood (Routine X 2) w Reflex to ID Panel     Status: None   Collection Time: 07/05/23  9:29 AM   Specimen: BLOOD LEFT HAND  Result Value Ref Range Status   Specimen Description BLOOD LEFT HAND  Final   Special Requests   Final    BOTTLES DRAWN AEROBIC AND ANAEROBIC Blood Culture adequate volume   Culture   Final    NO GROWTH 5 DAYS Performed at [facility], [address] [place], [place] [address]    Report Status 07/10/2023 FINAL  Final         Radiology Studies: No results found.      Scheduled Meds:  amiodarone  400 mg Oral BID   Followed by   [name] ON 07/15/2023] amiodarone  200 mg Oral Daily   aspirin  81 mg Oral Daily   atorvastatin  80 mg Oral QHS   citalopram  20 mg Oral Daily   enoxaparin (LOVENOX) injection  80 mg Subcutaneous Q12H   feeding supplement  237 mL Oral TID BM   folic acid  1 mg Oral Daily   metoprolol tartrate  50 mg Oral BID   multivitamin with minerals  1 tablet Oral Daily   nicotine  21 mg Transdermal Daily   pantoprazole  40 mg Oral Daily   Continuous Infusions:  sodium chloride Stopped (07/08/23 1918)   thiamine (VITAMIN B1) injection 500 mg (07/12/23 0617)     LOS: 10 days      Huey Bienenstock, MD [facility]   To contact the attending provider between 7A-7P or the covering provider during after hours 7P-7A, please log into the web site [email or web address] and access using universal [place] password for that web site. If you do not have the password, please call the hospital operator.  07/12/2023, 1:25 PM

## 2023-07-12 NOTE — Progress Notes (Signed)
ANTICOAGULATION CONSULT NOTE - Initial Consult  Pharmacy Consult for Eliquis  Indication: Nonvalvular Atrial Fibrillation  Allergies  Allergen Reactions   Naproxen Nausea Only   Sulfa Antibiotics Nausea Only    Patient Measurements: Height: 6' (182.9 cm) Weight: 83.1 kg (183 lb 3.2 oz) IBW/kg (Calculated) : 77.6   Vital Signs: Temp: 98.6 F (37 C) (07/18 0430) Temp Source: Oral (07/18 1100) BP: 125/86 (07/18 0430) Pulse Rate: 94 (07/18 0749)  Labs: Recent Labs    07/10/23 0210 07/11/23 0545 07/12/23 0347  HGB 9.7* 9.9* 10.1*  HCT 33.4* 33.8* 35.0*  PLT 304 315 367  CREATININE 0.78 0.82 0.80    Estimated Creatinine Clearance: 102.4 mL/min (by C-G formula based on SCr of 0.8 mg/dL).   Medical History: Past Medical History:  Diagnosis Date   Aortic stenosis    Status post AVR with 27 mm Edwards INSPIRIS RESILIA pericardial valve January 2024   Bicuspid aortic valve    CAD (coronary artery disease)    Status post LIMA to LAD January 2024   Chronic alcohol abuse    Classic migraine 09/02/2013   CVA (cerebral vascular accident) (HCC)    Diabetes mellitus without complication (HCC)    GERD (gastroesophageal reflux disease)    Gouty arthritis    Hypertension    Major depression    Mixed hyperlipidemia    Peptic ulcer disease    Sleep apnea     Medications:  Medications Prior to Admission  Medication Sig Dispense Refill Last Dose   ALPRAZolam (XANAX) 0.5 MG tablet Take 0.5 mg by mouth 2 (two) times daily.      aspirin 81 MG tablet Take 81 mg by mouth daily.      atorvastatin (LIPITOR) 80 MG tablet Take 1 tablet (80 mg total) by mouth daily. 30 tablet 6    citalopram (CELEXA) 40 MG tablet Take 20 mg by mouth daily.      clopidogrel (PLAVIX) 75 MG tablet Take 1 tablet (75 mg total) by mouth daily. 90 tablet 1    esomeprazole (NEXIUM) 40 MG capsule Take 40 mg by mouth daily.      HYDROcodone-acetaminophen (NORCO/VICODIN) 5-325 MG tablet Take 1 tablet by mouth  4 (four) times daily.      hydrocortisone (ANUSOL-HC) 2.5 % rectal cream Place 1 application  rectally daily as needed for hemorrhoids or anal itching.      levocetirizine (XYZAL) 5 MG tablet Take 5 mg by mouth every evening.      losartan (COZAAR) 25 MG tablet Take 25 mg by mouth daily.      metoprolol tartrate (LOPRESSOR) 25 MG tablet Take 0.5 tablets (12.5 mg total) by mouth 2 (two) times daily. 90 tablet 1    Multiple Vitamins-Minerals (MULTIVITAMIN WITH MINERALS) tablet Take 1 tablet by mouth daily. (Patient not taking: Reported on 04/10/2023)      ondansetron (ZOFRAN) 8 MG tablet Take 8 mg by mouth once a week.      sildenafil (VIAGRA) 100 MG tablet Take 100 mg by mouth daily as needed for erectile dysfunction.      spironolactone (ALDACTONE) 25 MG tablet TAKE 1 TABLET ONCE DAILY 30 tablet 8     Assessment: 67 y.o male with here with  new onset  Afib.   Was not on anticoagulation prior to admit.  Started  Apixaban on 7/8, then on 7/12 PM-Apixaban was switched to Lovenox 80mg  q12h while patient  unable to swallow. Today 7/18 MD notes patient's mentation has much improved. Pharmacy  consulted to switch to Apixaban.  Patient is OFF phenobarbital after tapered, thus no drug interaction with apixaban. Hgb 9s-10s, Pltc wnl stable.  No bleeding reported.   Goal of Therapy:  Stroke prevention  Monitor platelets by anticoagulation protocol: Yes   Plan:  Discontinue lovenox now.  Apixaban 5mg  BID, restart at 2200 tonight.  Monitor for s/sx of bleeding.  Pharmacy will sign off   Thank you for allowing pharmacy to be part of this patients care team. Noah Delaine, RPh Clinical Pharmacist [phone] 07/12/2023,2:49 PM   Please check AMION for all [facility] Pharmacy phone numbers After 10:00 PM, call Main Pharmacy [phone]

## 2023-07-12 NOTE — Progress Notes (Addendum)
Cardiology recommendations: Atrial fibrillation/flutter  Patient is to resume amiodarone 200 mg daily until outpatient visit which has already been scheduled on 07/27/2023.  Will also arrange TOC phone calll. Will defer DCCV/TEE during this admission due to concern of overall compliancy and alcohol withdrawal.  We will place him on 3 weeks of uninterrupted anticoagulation to ensure he can remain compliant and will consider DCCV at that time/discontinuation of amiodarone.  Costs have been a significant barrier for patient, so I have called TOC to help with coupons and financial assistance.  Additionally, metoprolol 50 mg can be titrated up to every 6 if needed.  Please let us know if you have any other questions.

## 2023-07-12 NOTE — Telephone Encounter (Addendum)
   Transition of Care Follow-up Phone Call Request    Patient Name: Raymond Roberson Date of Birth: [birth year] Date of Encounter: 07/12/2023  Primary Care Provider:  Richardean Chimera, MD Primary Cardiologist:  Marjo Bicker, MD  Raymond Roberson has been scheduled for a transition of care follow up appointment with a [facility] provider:  Patient has appointment with Sharlene Dory on 07/27/2023 at 2:30 PM. Still currently admitted but with plans for DC soon. He is somebody that needs extra support so please be persistent if he is difficult to reach.   Addendum 08/01/2023. Patient at some point called back and cancelled hospital follow up and did not elect to reschedule. Sending message to staffing nurse to help reach out once again. Will also attach E. Philis Nettle to make aware. If patient elects not to be seen again, please ensure we document that encounter.   Please reach out to [employer] within 48 hours to confirm appointment and review transition of care protocol questionnaire.  Abagail Kitchens, PA-C  07/12/2023, 4:58 PM

## 2023-07-12 NOTE — Plan of Care (Signed)

## 2023-07-12 NOTE — TOC Progression Note (Signed)
Transition of Care [facility]) - Progression Note    Patient Details  Name: Raymond Roberson MRN: [id number] Date of Birth: 05/02/1956  [facility] [place] [facility] CM/SW Contact  Mearl Latin, LCSW Phone Number: 07/12/2023, 4:51 PM  Clinical Narrative:    CSW received order to rescind IVC. CSW uploaded Commitment Change to system, Envelope [id number].   Expected Discharge Plan: Home w Home Health Services Barriers to Discharge: Continued Medical Work up  Expected Discharge Plan and Services   Discharge Planning Services: CM Consult   Living arrangements for the past 2 months: Single Family Home                                       Social Determinants of Health (SDOH) Interventions SDOH Screenings   Food Insecurity: No Food Insecurity (07/02/2023)  Housing: Low Risk  (07/02/2023)  Transportation Needs: No Transportation Needs (07/02/2023)  Utilities: Not At Risk (07/02/2023)  Tobacco Use: High Risk (07/02/2023)  Health Literacy: High Risk (01/05/2022)   Received from [facility], [facility]    Readmission Risk Interventions     No data to display

## 2023-07-12 NOTE — Plan of Care (Signed)
  Problem: Education: Goal: Knowledge of General Education information will improve Description: Including pain rating scale, medication(s)/side effects and non-pharmacologic comfort measures Outcome: Progressing   Problem: Clinical Measurements: Goal: Ability to maintain clinical measurements within normal limits will improve Outcome: Progressing   Problem: Activity: Goal: Risk for activity intolerance will decrease Outcome: Progressing   Problem: Activity: Goal: Ability to tolerate increased activity will improve Outcome: Progressing

## 2023-07-13 LAB — GLUCOSE, CAPILLARY
Glucose-Capillary: 100 mg/dL — ABNORMAL HIGH (ref 70–99)
Glucose-Capillary: 102 mg/dL — ABNORMAL HIGH (ref 70–99)
Glucose-Capillary: 105 mg/dL — ABNORMAL HIGH (ref 70–99)
Glucose-Capillary: 114 mg/dL — ABNORMAL HIGH (ref 70–99)
Glucose-Capillary: 129 mg/dL — ABNORMAL HIGH (ref 70–99)
Glucose-Capillary: 89 mg/dL (ref 70–99)

## 2023-07-13 LAB — CBC
HCT: 37.5 % — ABNORMAL LOW (ref 39.0–52.0)
Hemoglobin: 10.8 g/dL — ABNORMAL LOW (ref 13.0–17.0)
MCH: 22.4 pg — ABNORMAL LOW (ref 26.0–34.0)
MCHC: 28.8 g/dL — ABNORMAL LOW (ref 30.0–36.0)
MCV: 77.6 fL — ABNORMAL LOW (ref 80.0–100.0)
Platelets: 475 10*3/uL — ABNORMAL HIGH (ref 150–400)
RBC: 4.83 MIL/uL (ref 4.22–5.81)
RDW: 18.6 % — ABNORMAL HIGH (ref 11.5–15.5)
WBC: 9.5 10*3/uL (ref 4.0–10.5)
nRBC: 0 % (ref 0.0–0.2)

## 2023-07-13 LAB — PHOSPHORUS: Phosphorus: 2.8 mg/dL (ref 2.5–4.6)

## 2023-07-13 MED ORDER — APIXABAN 5 MG PO TABS
5.0000 mg | ORAL_TABLET | Freq: Two times a day (BID) | ORAL | Status: DC
  Administered 2023-07-13 – 2023-07-15 (×4): 5 mg via ORAL
  Filled 2023-07-13 (×4): qty 1

## 2023-07-13 MED ORDER — PHENYLEPHRINE-MINERAL OIL-PET 0.25-14-74.9 % RE OINT
1.0000 | TOPICAL_OINTMENT | Freq: Two times a day (BID) | RECTAL | Status: DC | PRN
  Filled 2023-07-13: qty 57

## 2023-07-13 MED ORDER — LIDOCAINE 5 % EX PTCH
1.0000 | MEDICATED_PATCH | CUTANEOUS | Status: DC
  Administered 2023-07-13 – 2023-07-14 (×2): 1 via TRANSDERMAL
  Filled 2023-07-13 (×2): qty 1

## 2023-07-13 NOTE — Plan of Care (Signed)

## 2023-07-13 NOTE — Progress Notes (Signed)
PROGRESS NOTE    Raymond Roberson  WUJ:[id number] DOB: Apr 26, 1956 DOA: 07/02/2023 PCP: Richardean Chimera, MD  Chief Complaint  Patient presents with   Hypertension    Brief Narrative:    Raymond Roberson is a 67 y.o. M with PMH significant for CAD s/p CABG 12/2022, Ischemic cardiomyopathy, HTN, HFrEF and ETOH abuse with reported 12 beers/day who presented to [facility] ED with two days of dizziness and shortness of breath.  He stopped drinking thinking that this would help (evening of 7/8), and then developed tremulousness and diaphoresis.   In the ED he was found to be in Afib/flutter and was treated with amiodarone.  He became increasingly agitated and required precedex and eventual intubation so was transferred to [facility].  Labs notable for flat troponin 25>25, ethanol <10, ABG 7.52/30/158/25   On 7/8 presented to [facility] in aflutter and developed worsening ETOH withdrawal requiring intubation. He was transferred to [facility] on 07/03/2023 stable on propofol and CIWA to the care of the ICU. He developed a fever on the night of 7/11. He was started on IV Unasyn on 07/05/2023. Blood cultures x 2, respiratory cultures, and MRSA by PCR were obtained. Blood cultures have had no growth, but respiratory cultures have grown out strep agalactiaea. The unasyn was changed to rocephin. He has completed this course. The patient was extubated on 07/06/2023 to 4 L [place].   The patient was transferred out of the unit to [facility] on 07/10/2023. His mentation was reportedly much more clear on 07/12/2023.   On 07/13/2023 the patient is awake, alert, and oriented x 3. He is complaining bitterly of right shoulder pain. X-ray of the shoulder demonstrates only mild osteoarthritis.  He is receiving a lidocaine patch and oxycodone 5 mg q4 hours for pain.    Consultants:  PCCM Cardiology   Subjective:  The patient is awake, alert, and oriented x 3. He is complaining bitterly of right shoulder pain. X-ray of the shoulder demonstrates only  mild osteoarthritis.  He is receiving a lidocaine patch and oxycodone 5 mg q4 hours for pain.  IVC has been rescinded. The plan is for the patient to go home with home health. PT/OT has been ordered.  Objective: Vitals:   07/13/23 1627 07/13/23 1628 07/13/23 1629 07/13/23 1630  BP:      Pulse:   93 (!) 102  Resp: (!) 23 17 17 16   Temp:      TempSrc:      SpO2:   95% 95%  Weight:      Height:       No intake or output data in the 24 hours ending 07/13/23 1703  Filed Weights   07/05/23 0421 07/06/23 0441 07/07/23 0500  Weight: 84.8 kg 84.6 kg 83.1 kg    Examination:  Exam:  Constitutional:  The patient is awake, alert, and oriented x 3. No acute distress. He appears frail and older than his stated age. Respiratory:  No increased work of breathing. No wheezes, rales, or rhonchi No tactile fremitus Cardiovascular:  Regular rate and rhythm No murmurs, ectopy, or gallups. No lateral PMI. No thrills. Abdomen:  Abdomen is soft, non-tender, non-distended No hernias, masses, or organomegaly Normoactive bowel sounds.  Musculoskeletal:  No cyanosis, clubbing, or edema The patient's right shoulder is tender to touch. He has moist heat pack on it now.  Skin:  No rashes, lesions, ulcers palpation of skin: no induration or nodules Neurologic:  CN 2-12 intact Sensation all 4 extremities intact Psychiatric:  Mental status Mood, affect appropriate Orientation to person, place, time    Data Reviewed: I have personally reviewed following labs and imaging studies  CBC: Recent Labs  Lab 07/07/23 0110 07/08/23 0838 07/10/23 0210 07/11/23 0545 07/12/23 0347 07/13/23 0825  WBC 8.4  --  7.3 7.4 8.4 9.5  HGB 8.8* 10.2* 9.7* 9.9* 10.1* 10.8*  HCT 30.7* 30.0* 33.4* 33.8* 35.0* 37.5*  MCV 74.5*  --  73.7* 75.4* 75.9* 77.6*  PLT 215  --  304 315 367 475*    Basic Metabolic Panel: Recent Labs  Lab 07/07/23 0110 07/08/23 0036 07/08/23 0981 07/09/23 0140 07/10/23 0210  07/11/23 0545 07/12/23 0347 07/13/23 0825  NA 137 139 138 135 137 136 136  --   K 3.6 3.9 3.8 3.0* 3.1* 3.7 3.8  --   CL 104 98  --  101 102 105 105  --   CO2 25 26  --  24 27 24 24   --   GLUCOSE 109* 113*  --  86 78 85 86  --   BUN 12 14  --  11 9 7* 7*  --   CREATININE 0.87 0.81  --  0.79 0.78 0.82 0.80  --   CALCIUM 8.2* 9.0  --  8.8* 8.4* 8.4* 8.3*  --   MG 2.2 2.3  --  2.4 2.2 2.3 2.5*  --   PHOS 2.9  --   --   --  3.0 3.2 3.2 2.8    GFR: Estimated Creatinine Clearance: 102.4 mL/min (by C-G formula based on SCr of 0.8 mg/dL).  Liver Function Tests: Recent Labs  Lab 07/07/23 0110  ALBUMIN 2.5*    CBG: Recent Labs  Lab 07/12/23 2304 07/13/23 0407 07/13/23 0821 07/13/23 1211 07/13/23 1621  GLUCAP 91 100* 89 105* 129*     Recent Results (from the past 240 hour(s))  Culture, Respiratory w Gram Stain     Status: None   Collection Time: 07/05/23  7:24 AM   Specimen: Tracheal Aspirate; Respiratory  Result Value Ref Range Status   Specimen Description TRACHEAL ASPIRATE  Final   Special Requests NONE  Final   Gram Stain   Final    ABUNDANT WBC PRESENT, PREDOMINANTLY PMN ABUNDANT GRAM POSITIVE COCCI IN PAIRS MODERATE GRAM NEGATIVE RODS FEW GRAM POSITIVE RODS    Culture   Final    ABUNDANT GROUP B STREP(S.AGALACTIAE)ISOLATED TESTING AGAINST S. AGALACTIAE NOT ROUTINELY PERFORMED DUE TO PREDICTABILITY OF AMP/PEN/VAN SUSCEPTIBILITY. WITHIN MIXED NORMAL RESPIRATORY FLORA Performed at [facility], [address], [place], [place] [address]    Report Status 07/08/2023 FINAL  Final  Culture, blood (Routine X 2) w Reflex to ID Panel     Status: None   Collection Time: 07/05/23  9:29 AM   Specimen: BLOOD LEFT HAND  Result Value Ref Range Status   Specimen Description BLOOD LEFT HAND  Final   Special Requests   Final    BOTTLES DRAWN AEROBIC AND ANAEROBIC Blood Culture adequate volume   Culture   Final    NO GROWTH 5 DAYS Performed at [facility],  [address], [place], [place] [address]    Report Status 07/10/2023 FINAL  Final  Culture, blood (Routine X 2) w Reflex to ID Panel     Status: None   Collection Time: 07/05/23  9:29 AM   Specimen: BLOOD LEFT HAND  Result Value Ref Range Status   Specimen Description BLOOD LEFT HAND  Final   Special  Requests   Final    BOTTLES DRAWN AEROBIC AND ANAEROBIC Blood Culture adequate volume   Culture   Final    NO GROWTH 5 DAYS Performed at [facility], [address], [place], [place] [address]    Report Status 07/10/2023 FINAL  Final         Radiology Studies: DG Shoulder Right  Result Date: 07/12/2023 CLINICAL DATA:  [id number] Pain 144615 EXAM: RIGHT SHOULDER - 2+ VIEW COMPARISON:  None Available. FINDINGS: No acute fracture or dislocation. No aggressive osseous lesion. Glenohumeral and acromioclavicular joints are normal in alignment. Mild osteoarthritis of the acromioclavicular joint. Mild osteoarthritis of the glenohumeral joint. No soft tissue swelling. No radiopaque foreign bodies. IMPRESSION: 1. Mild osteoarthritis. Electronically Signed   By: Jules Schick M.D.   On: 07/12/2023 15:26        Scheduled Meds:  amiodarone  400 mg Oral BID   Followed by   [name] ON 07/15/2023] amiodarone  200 mg Oral Daily   apixaban  5 mg Oral BID   aspirin  81 mg Oral Daily   atorvastatin  80 mg Oral QHS   citalopram  20 mg Oral Daily   feeding supplement  237 mL Oral TID BM   folic acid  1 mg Oral Daily   lidocaine  1 patch Transdermal Q24H   metoprolol tartrate  50 mg Oral BID   multivitamin with minerals  1 tablet Oral Daily   nicotine  21 mg Transdermal Daily   pantoprazole  40 mg Oral Daily   Continuous Infusions:  sodium chloride Stopped (07/08/23 1918)   thiamine (VITAMIN B1) injection 500 mg (07/13/23 1454)    Assessment & Plan:   Principal Problem:   Alcohol withdrawal (HCC) Active Problems:   Atrial flutter with rapid ventricular response (HCC)   HTN  (hypertension)   S/P AVR   Ischemic cardiomyopathy   S/P CABG x 1   Chronic back pain   Alcohol withdrawal with delirium (HCC)   Acute respiratory failure with hypoxia (HCC)   CAP (community acquired pneumonia) due to group B Streptococcus (HCC)   Encephalopathy acute    Acute Toxic metabolic encephalopathy due to ETOH withdrawal -stopped drinking evening of 07/02/23; some dizziness and SOB preceded stopping drinking so the Aflutter may have preceded w/d.   - Ammonia neg. -Required Precedex drip, has been tapered off and transferred out of ICU  - continue with Phenobarb taper - PRN ativan - Alcohol cessation - Continue with folic acid  -High-dose IV thiamine, will change to PO. -He finished phenobarbital taper -Severe alcohol withdrawal with delirium, he was IVC does he try to leave multiple times, mentation much improved, back to baseline today, IVC has been discontinued.   Acute hypoxic respiratory failure secondary to above and likely aspiration- improved (extubated 7/12) Pneumonia -  secondary to group B strep - finished  rocephin 5 day course - Supplemental O2. Remains on 4 L oxygen via nasal cannula   Arial fib/flutter, new onset History of CAD w/CABG (12/2022 x1 LIMA-LAD), ischemic cardiomyopathy/ HFrEF, HTN -Heart rate fluctuates widely, on amiodarone drip, transition to p.o. amiodarone, blood pressure remains soft, will try to increase his metoprolol 50 mg p.o. twice daily if tolerates, . -Required IV Cardizem x 2 today for better heart rate control as has been sustaining 130 overnight and this morning -  -Cardiology was consulted. The patient has been transitioned to amiodarone 400 mg BID. He will then be tapered down to 200 mg daily.  They recommend that if his heart rates are poorly controlled on the 200 daily to increase metoprolol to 50 mg q 6 hours prn, then consolidate to XL.  -He was on full anticoagulation with Lovenox. He was transitioned to Eliquis on  07/12/2023.  Dysphagia - related to encephalopathy, improved - Defer cortrak.  Currently on regular diet   Iron def anemia - IV iron given. Hemoglobin has trended up from 10.1 to 10.8. Monitor.  Hypokalemia - Replete. Stable at 3.8.   Right Shoulder Pain: The patient states that this pain is chronic, but that it is worse after he was in the ED. X-ray of right shoulder demonstrates only chronic osteoarthritis. He has been started on a lidocaine patch. He is using moist heat on the joint. He also has oxycodone available.   DVT prophylaxis: Lovenox> Eliquis Code Status: Full Family Communication: D/W Fiance at bedside Disposition: TBD. PT/OT ordered.  Status is: Inpatient  LOS: 11 days   Imogene Gravelle, MD [facility]   To contact the attending provider between 7A-7P or the covering provider during after hours 7P-7A, please log into the web site [email or web address] and access using universal [place] password for that web site. If you do not have the password, please call the hospital operator.  07/13/2023, 5:03 PM

## 2023-07-13 NOTE — Care Management Important Message (Signed)
Important Message  Patient Details  Name: Raymond Roberson MRN: [id number] Date of Birth: [birth year]   Medicare Important Message Given:  Yes     Sherilyn Banker 07/13/2023, 3:25 PM

## 2023-07-13 NOTE — Plan of Care (Signed)
Can continue amiodarone 400 mg BID as scheduled then 200 mg daily thereafter. If heart rates here are poorly controlled, can increase metroprolol to 50 mg tartrate q6H PRN. Then can consolidate to XL. Don't hesitate to reach out for questions.

## 2023-07-14 DIAGNOSIS — I4892 Unspecified atrial flutter: Secondary | ICD-10-CM | POA: Diagnosis not present

## 2023-07-14 LAB — GLUCOSE, CAPILLARY
Glucose-Capillary: 140 mg/dL — ABNORMAL HIGH (ref 70–99)
Glucose-Capillary: 143 mg/dL — ABNORMAL HIGH (ref 70–99)
Glucose-Capillary: 82 mg/dL (ref 70–99)
Glucose-Capillary: 88 mg/dL (ref 70–99)
Glucose-Capillary: 92 mg/dL (ref 70–99)
Glucose-Capillary: 96 mg/dL (ref 70–99)

## 2023-07-14 LAB — CBC
HCT: 36 % — ABNORMAL LOW (ref 39.0–52.0)
Hemoglobin: 10.5 g/dL — ABNORMAL LOW (ref 13.0–17.0)
MCH: 22.5 pg — ABNORMAL LOW (ref 26.0–34.0)
MCHC: 29.2 g/dL — ABNORMAL LOW (ref 30.0–36.0)
MCV: 77.3 fL — ABNORMAL LOW (ref 80.0–100.0)
Platelets: 446 10*3/uL — ABNORMAL HIGH (ref 150–400)
RBC: 4.66 MIL/uL (ref 4.22–5.81)
RDW: 18.9 % — ABNORMAL HIGH (ref 11.5–15.5)
WBC: 8.1 10*3/uL (ref 4.0–10.5)
nRBC: 0 % (ref 0.0–0.2)

## 2023-07-14 LAB — BASIC METABOLIC PANEL
Anion gap: 7 (ref 5–15)
BUN: 7 mg/dL — ABNORMAL LOW (ref 8–23)
CO2: 26 mmol/L (ref 22–32)
Calcium: 8.8 mg/dL — ABNORMAL LOW (ref 8.9–10.3)
Chloride: 104 mmol/L (ref 98–111)
Creatinine, Ser: 0.85 mg/dL (ref 0.61–1.24)
GFR, Estimated: >60 mL/min (ref >60)
Glucose, Bld: 87 mg/dL (ref 70–99)
Potassium: 3.7 mmol/L (ref 3.5–5.1)
Sodium: 137 mmol/L (ref 135–145)

## 2023-07-14 LAB — PHOSPHORUS: Phosphorus: 3.5 mg/dL (ref 2.5–4.6)

## 2023-07-14 LAB — MAGNESIUM: Magnesium: 2.5 mg/dL — ABNORMAL HIGH (ref 1.7–2.4)

## 2023-07-14 MED ORDER — METHOCARBAMOL 500 MG PO TABS
500.0000 mg | ORAL_TABLET | Freq: Three times a day (TID) | ORAL | Status: DC | PRN
  Administered 2023-07-14 – 2023-07-15 (×3): 500 mg via ORAL
  Filled 2023-07-14 (×3): qty 1

## 2023-07-14 MED ORDER — HYDROCERIN EX CREA
TOPICAL_CREAM | Freq: Two times a day (BID) | CUTANEOUS | Status: DC
  Administered 2023-07-14: 1 via TOPICAL
  Filled 2023-07-14: qty 113

## 2023-07-14 MED ORDER — THIAMINE MONONITRATE 100 MG PO TABS
100.0000 mg | ORAL_TABLET | Freq: Every day | ORAL | Status: DC
  Administered 2023-07-15: 100 mg via ORAL
  Filled 2023-07-14: qty 1

## 2023-07-14 NOTE — Plan of Care (Signed)

## 2023-07-14 NOTE — Progress Notes (Signed)
Oxycodone 5mg  with 650 mg po given. Noticed patient spit something out and flicked it. Looked on window seal and noticed there was a white small pill on window seal next to patient. I asked patient if he spit his medicine out and he said he did.  He wanted to save it for later.  I educated patient on hospital policy and he would have to take med now. Patient took med, stuck tongue out and lifted tongue. No med noticed in patients mouth.

## 2023-07-14 NOTE — Progress Notes (Signed)
PROGRESS NOTE    CARMICHAEL BURDETTE  MWN:[id number] DOB: Mar 18, 1956 DOA: 07/02/2023 PCP: Richardean Chimera, MD  Chief Complaint  Patient presents with   Hypertension    Brief Narrative:    Taygen Acklin is a 68 y.o. M with PMH significant for CAD s/p CABG 12/2022, Ischemic cardiomyopathy, HTN, HFrEF and ETOH abuse with reported 12 beers/day who presented to [facility] with two days of dizziness and shortness of breath.  He stopped drinking thinking that this would help (evening of 7/8), and then developed tremulousness and diaphoresis.   In the ED he was found to be in Afib/flutter and was treated with amiodarone.  He became increasingly agitated and required precedex and eventual intubation so was transferred to [facility].  Labs notable for flat troponin 25>25, ethanol <10, ABG 7.52/30/158/25   On 7/8 presented to [facility] in aflutter and developed worsening ETOH withdrawal requiring intubation. He was transferred to [facility] on 07/03/2023 stable on propofol and CIWA to the care of the ICU. He developed a fever on the night of 7/11. He was started on IV Unasyn on 07/05/2023. Blood cultures x 2, respiratory cultures, and MRSA by PCR were obtained. Blood cultures have had no growth, but respiratory cultures have grown out strep agalactiaea. The unasyn was changed to rocephin. He has completed this course. The patient was extubated on 07/06/2023 to 4 L [place].   The patient was transferred out of the unit to [facility] on 07/10/2023. His mentation was reportedly much more clear on 07/12/2023.   On 07/13/2023 the patient is awake, alert, and oriented x 3. He is complaining bitterly of right shoulder pain. X-ray of the shoulder demonstrates only mild osteoarthritis.  He is receiving a lidocaine patch and oxycodone 5 mg q4 hours for pain.    Consultants:  PCCM Cardiology   Subjective:  The patient is awake, alert, and oriented x 3. He is complaining bitterly of right shoulder pain. X-ray of the shoulder demonstrates only  mild osteoarthritis.  He is receiving a lidocaine patch and oxycodone 5 mg q4 hours for pain.  IVC has been rescinded. The plan is for the patient to go home with home health. PT/OT has been ordered.  Objective: Vitals:   07/14/23 0000 07/14/23 0407 07/14/23 0820 07/14/23 1640  BP: 124/87 116/72 109/66 112/78  Pulse: 60 69 82 89  Resp: 18 14 16 16   Temp: 98.1 F (36.7 C) 98.4 F (36.9 C) 97.8 F (36.6 C) 98.4 F (36.9 C)  TempSrc: Oral  Oral Oral  SpO2: 94% 95% 97% 98%  Weight:      Height:        Intake/Output Summary (Last 24 hours) at 07/14/2023 1818 Last data filed at 07/14/2023 0920 Gross per 24 hour  Intake --  Output 500 ml  Net -500 ml    Filed Weights   07/05/23 0421 07/06/23 0441 07/07/23 0500  Weight: 84.8 kg 84.6 kg 83.1 kg    Examination:  Exam:  Constitutional:  The patient is awake, alert, and oriented x 3. No acute distress. He appears frail and older than his stated age. Respiratory:  No increased work of breathing. No wheezes, rales, or rhonchi No tactile fremitus Cardiovascular:  Regular rate and rhythm No murmurs, ectopy, or gallups. No lateral PMI. No thrills. Abdomen:  Abdomen is soft, non-tender, non-distended No hernias, masses, or organomegaly Normoactive bowel sounds.  Musculoskeletal:  No cyanosis, clubbing, or edema The patient's right shoulder is tender to touch. He has moist heat  pack on it now.  Skin:  No rashes, lesions, ulcers palpation of skin: no induration or nodules Neurologic:  CN 2-12 intact Sensation all 4 extremities intact Psychiatric:  Mental status Mood, affect appropriate Orientation to person, place, time    Data Reviewed: I have personally reviewed following labs and imaging studies  CBC: Recent Labs  Lab 07/10/23 0210 07/11/23 0545 07/12/23 0347 07/13/23 0825 07/14/23 0318  WBC 7.3 7.4 8.4 9.5 8.1  HGB 9.7* 9.9* 10.1* 10.8* 10.5*  HCT 33.4* 33.8* 35.0* 37.5* 36.0*  MCV 73.7* 75.4* 75.9*  77.6* 77.3*  PLT 304 315 367 475* 446*    Basic Metabolic Panel: Recent Labs  Lab 07/09/23 0140 07/10/23 0210 07/11/23 0545 07/12/23 0347 07/13/23 0825 07/14/23 0318  NA 135 137 136 136  --  137  K 3.0* 3.1* 3.7 3.8  --  3.7  CL 101 102 105 105  --  104  CO2 24 27 24 24   --  26  GLUCOSE 86 78 85 86  --  87  BUN 11 9 7* 7*  --  7*  CREATININE 0.79 0.78 0.82 0.80  --  0.85  CALCIUM 8.8* 8.4* 8.4* 8.3*  --  8.8*  MG 2.4 2.2 2.3 2.5*  --  2.5*  PHOS  --  3.0 3.2 3.2 2.8 3.5    GFR: Estimated Creatinine Clearance: 96.4 mL/min (by C-G formula based on SCr of 0.85 mg/dL).  Liver Function Tests: No results for input(s): "AST", "ALT", "ALKPHOS", "BILITOT", "PROT", "ALBUMIN" in the last 168 hours.   CBG: Recent Labs  Lab 07/13/23 2347 07/14/23 0406 07/14/23 0821 07/14/23 1252 07/14/23 1643  GLUCAP 114* 92 82 143* 140*     Recent Results (from the past 240 hour(s))  Culture, Respiratory w Gram Stain     Status: None   Collection Time: 07/05/23  7:24 AM   Specimen: Tracheal Aspirate; Respiratory  Result Value Ref Range Status   Specimen Description TRACHEAL ASPIRATE  Final   Special Requests NONE  Final   Gram Stain   Final    ABUNDANT WBC PRESENT, PREDOMINANTLY PMN ABUNDANT GRAM POSITIVE COCCI IN PAIRS MODERATE GRAM NEGATIVE RODS FEW GRAM POSITIVE RODS    Culture   Final    ABUNDANT GROUP B STREP(S.AGALACTIAE)ISOLATED TESTING AGAINST S. AGALACTIAE NOT ROUTINELY PERFORMED DUE TO PREDICTABILITY OF AMP/PEN/VAN SUSCEPTIBILITY. WITHIN MIXED NORMAL RESPIRATORY FLORA Performed at [facility], [address], [place], [place] [address]    Report Status 07/08/2023 FINAL  Final  Culture, blood (Routine X 2) w Reflex to ID Panel     Status: None   Collection Time: 07/05/23  9:29 AM   Specimen: BLOOD LEFT HAND  Result Value Ref Range Status   Specimen Description BLOOD LEFT HAND  Final   Special Requests   Final    BOTTLES DRAWN AEROBIC AND ANAEROBIC Blood  Culture adequate volume   Culture   Final    NO GROWTH 5 DAYS Performed at [facility], [address], [place], [place] [address]    Report Status 07/10/2023 FINAL  Final  Culture, blood (Routine X 2) w Reflex to ID Panel     Status: None   Collection Time: 07/05/23  9:29 AM   Specimen: BLOOD LEFT HAND  Result Value Ref Range Status   Specimen Description BLOOD LEFT HAND  Final   Special Requests   Final    BOTTLES DRAWN AEROBIC AND ANAEROBIC Blood Culture adequate volume   Culture  Final    NO GROWTH 5 DAYS Performed at [facility], [address], [place], [place] [address]    Report Status 07/10/2023 FINAL  Final         Radiology Studies: No results found.      Scheduled Meds:  [START ON 07/15/2023] amiodarone  200 mg Oral Daily   apixaban  5 mg Oral BID   aspirin  81 mg Oral Daily   atorvastatin  80 mg Oral QHS   citalopram  20 mg Oral Daily   feeding supplement  237 mL Oral TID BM   folic acid  1 mg Oral Daily   hydrocerin   Topical BID   lidocaine  1 patch Transdermal Q24H   metoprolol tartrate  50 mg Oral BID   multivitamin with minerals  1 tablet Oral Daily   nicotine  21 mg Transdermal Daily   pantoprazole  40 mg Oral Daily   [START ON 07/15/2023] thiamine  100 mg Oral Daily   Continuous Infusions:  sodium chloride Stopped (07/08/23 1918)    Assessment & Plan:   Principal Problem:   Alcohol withdrawal (HCC) Active Problems:   Atrial flutter with rapid ventricular response (HCC)   HTN (hypertension)   S/P AVR   Ischemic cardiomyopathy   S/P CABG x 1   Chronic back pain   Alcohol withdrawal with delirium (HCC)   Acute respiratory failure with hypoxia (HCC)   CAP (community acquired pneumonia) due to group B Streptococcus (HCC)   Encephalopathy acute    Acute Toxic metabolic encephalopathy due to ETOH withdrawal -stopped drinking evening of 07/02/23; some dizziness and SOB preceded stopping drinking so the Aflutter may have  preceded w/d.   - Ammonia neg. -Required Precedex drip, has been tapered off and transferred out of ICU  - continue with Phenobarb taper - PRN ativan - Alcohol cessation - Continue with folic acid  -High-dose IV thiamine, will change to PO. -He finished phenobarbital taper -Severe alcohol withdrawal with delirium, he was IVC does he try to leave multiple times, mentation much improved, back to baseline today, IVC has been discontinued.   Acute hypoxic respiratory failure secondary to above and likely aspiration- improved (extubated 7/12) Pneumonia -  secondary to group B strep - finished  rocephin 5 day course - Supplemental O2. Remains on 4 L oxygen via nasal cannula   Arial fib/flutter, new onset History of CAD w/CABG (12/2022 x1 LIMA-LAD), ischemic cardiomyopathy/ HFrEF, HTN -Heart rate fluctuates widely, on amiodarone drip, transition to p.o. amiodarone, blood pressure remains soft, will try to increase his metoprolol 50 mg p.o. twice daily if tolerates, . -Required IV Cardizem x 2 today for better heart rate control as has been sustaining 130 overnight and this morning -  -Cardiology was consulted. The patient has been transitioned to amiodarone 400 mg BID. He will then be tapered down to 200 mg daily. They recommend that if his heart rates are poorly controlled on the 200 daily to increase metoprolol to 50 mg q 6 hours prn, then consolidate to XL.  -He was on full anticoagulation with Lovenox. He was transitioned to Eliquis on 07/12/2023.  Dysphagia - related to encephalopathy, improved - Defer cortrak.  Currently on regular diet   Iron def anemia - IV iron given. Hemoglobin has trended up from 10.1 to 10.8. Monitor.  Hypokalemia - Replete. Stable at 3.8.   Right Shoulder Pain: The patient states that this pain is chronic, but that it is worse after he  was in the ED. X-ray of right shoulder demonstrates only chronic osteoarthritis. He has been started on a lidocaine patch. He is  using moist heat on the joint. He also has oxycodone available.   DVT prophylaxis: Lovenox> Eliquis Code Status: Full Family Communication: D/W Fiance at bedside Disposition: TBD. PT/OT ordered.  Status is: Inpatient  LOS: 12 days   Darina Hartwell, MD [facility]   To contact the attending provider between 7A-7P or the covering provider during after hours 7P-7A, please log into the web site [email or web address] and access using universal [place] password for that web site. If you do not have the password, please call the hospital operator.  07/14/2023, 6:18 PM  {Select_[email or web address]:[id number]}

## 2023-07-14 NOTE — Evaluation (Signed)
Occupational Therapy Evaluation Patient Details Name: Raymond Roberson MRN: [id number] DOB: Oct 28, 1956 Today's Date: 07/14/2023   History of Present Illness 67 year old man presented 07/03/23 with palpitations found to be in atrial flutter with RVR with subsequent agitation felt related to alcohol withdrawal subsequently intubated. Extubated 07/06/23; +encephalopathy  PMH-aortic stenosis, CAD s/p CABG, cardiomyopathy, chronic alcohol abuse, migraine, CVA, DM, gouty arthritis, HTN, depression,   Clinical Impression   At baseline, pt Independently completes ADLs, IADLs, and functional mobility without an AD. Pt also drives. Pt now presents with decreased activity tolerance, mildly decreased B UE strength, and pain in Right shoulder at rest and worse with movement. Pt currently demonstrates ability to complete all ADLs and functional mobility/transfers Independent to Mod I. Pt will benefit from acute skilled OT services to address B UE strength and R shoulder pain and for training in energy conservation techniques to address decreased activity tolerance during functional tasks. Post acute discharge, no OT follow up is indicated at this time.      Recommendations for follow up therapy are one component of a multi-disciplinary discharge planning process, led by the attending physician.  Recommendations may be updated based on patient status, additional functional criteria and insurance authorization.   Assistance Recommended at Discharge PRN (PRN for meal prep/home mangement tasks)  Patient can return home with the following Other (comment) (PRN assist with meal prep and home management tasks)    Functional Status Assessment  Patient has had a recent decline in their functional status and demonstrates the ability to make significant improvements in function in a reasonable and predictable amount of time.  Equipment Recommendations  None recommended by OT    Recommendations for Other Services        Precautions / Restrictions Precautions Precautions: Other (comment) Precaution Comments: watch HR Restrictions Weight Bearing Restrictions: No      Mobility Bed Mobility               General bed mobility comments: Pt sitting in recliner at beginning and end of session.    Transfers Overall transfer level: Independent Equipment used: None                      Balance Overall balance assessment: Independent                                         ADL either performed or assessed with clinical judgement   ADL Overall ADL's : Independent;Modified independent;At baseline                                       General ADL Comments: Pt with mildly decreased activity tolerance but demonstrating ability to complete all ADLs Independent to Mod I (increased time).     Vision Baseline Vision/History: 1 Wears glasses (reader) Ability to See in Adequate Light: 0 Adequate Patient Visual Report: No change from baseline       Perception     Praxis      Pertinent Vitals/Pain Pain Assessment Pain Assessment: Faces Faces Pain Scale: Hurts little more Pain Location: Rt shoulder Pain Descriptors / Indicators: Aching, Grimacing, Guarding Pain Intervention(s): Limited activity within patient's tolerance     Hand Dominance Right   Extremity/Trunk Assessment Upper Extremity Assessment Upper Extremity Assessment: RUE deficits/detail;LUE deficits/detail RUE Deficits /  Details: Pt with mildly decreased strength with overal strength 4-/5 and pain in shoulder at rest and worse with movement. RUE Sensation: WNL RUE Coordination: WNL LUE Deficits / Details: Pt with mildly decreased strength with overal strength 4-/5 LUE Sensation: WNL LUE Coordination: WNL   Lower Extremity Assessment Lower Extremity Assessment: Defer to PT evaluation   Cervical / Trunk Assessment Cervical / Trunk Assessment: Normal   Communication  Communication Communication: No difficulties   Cognition Arousal/Alertness: Awake/alert Behavior During Therapy: WFL for tasks assessed/performed Overall Cognitive Status: Within Functional Limits for tasks assessed                                 General Comments: AAOx4 and pleasant throughout session. Pt demonstrates ability to appropriately answer questions related to safety and responding to emergency situations in the home.     General Comments  Fiance present throughout a portion of session. Pt with three episodes of tachycardia during session. During all episodes pt was sitting in recliner answering quesitons. During first and third pt HR rose from upper 80s/low 90s into low 130s. During second, pt HR elevated from 103 to 152. Pt reported feeling "emotional" while answering questions and "normal" throughout with no headache, dizziness, etc. RN and attending DO notified.    Exercises     Shoulder Instructions      Home Living Family/patient expects to be discharged to:: Private residence Living Arrangements: Spouse/significant other Available Help at Discharge: Family;Available 24 hours/day Type of Home: House Home Access: Stairs to enter [employer] of Steps: 3 Entrance Stairs-Rails: Right Home Layout: One level     Bathroom Shower/Tub: Tub/shower unit;Walk-in shower   Bathroom Toilet: Standard     Home Equipment: Shower seat - built in;Shower seat;Wheelchair - Nurse, children's (2 wheels);Rollator (4 wheels);Cane - quad;Cane - single point;Hand held shower head          Prior Functioning/Environment Prior Level of Function : Independent/Modified Independent;Driving             Mobility Comments: Independent without an AD ADLs Comments: Independent with ADLs and IADLs        OT Problem List: Decreased strength;Pain      OT Treatment/Interventions: Therapeutic exercise    OT Goals(Current goals can be found in the  care plan section) Acute Rehab OT Goals Patient Stated Goal: To return home and have less pain in his shoulder OT Goal Formulation: With patient Time For Goal Achievement: 07/27/23 Potential to Achieve Goals: Good ADL Goals Pt/caregiver will Perform Home Exercise Program: Increased strength;Both right and left upper extremity;With theraband;With theraputty;Independently;With written HEP provided Additional ADL Goal #1: Patient will demonstrate ability to Independently state 4 energy conservation techniques to increase safety and address decreased activity tolerance during functional tasks in the home.  OT Frequency: Min 1X/week    Co-evaluation              AM-PAC OT "6 Clicks" Daily Activity     Outcome Measure Help from another person eating meals?: None Help from another person taking care of personal grooming?: None Help from another person toileting, which includes using toliet, bedpan, or urinal?: None Help from another person bathing (including washing, rinsing, drying)?: None Help from another person to put on and taking off regular upper body clothing?: None Help from another person to put on and taking off regular lower body clothing?: None 6 Click Score: 24   End of Session  Nurse Communication: Mobility status;Other (comment) (episodes of tachycardia during session)  Activity Tolerance: Patient tolerated treatment well Patient left: in chair;with call bell/phone within reach;with family/visitor present  OT Visit Diagnosis: Pain                Time: 4270-6237 OT Time Calculation (min): 28 min Charges:  OT General Charges $OT Visit: 1 Visit OT Evaluation $OT Eval Low Complexity: 1 Low  Nhi Butrum "Orson Eva., OTR/L, MA Acute Rehab [phone]   Lendon Colonel 07/14/2023, 7:26 PM

## 2023-07-14 NOTE — Progress Notes (Signed)
Ok to change IV thiamine to 100mg  PO qday per Dr Gerri Lins.  Ulyses Southward, PharmD, BCIDP, AAHIVP, CPP Infectious Disease Pharmacist 07/14/2023 2:41 PM

## 2023-07-14 NOTE — TOC Progression Note (Signed)
Transition of Care [facility]) - Progression Note    Patient Details  Name: JAFET WISSING MRN: [id number] Date of Birth: 07-29-1956  Transition of Care [facility]) CM/SW Contact  Donnalee Curry, LCSWA Phone Number: 07/14/2023, 12:28 PM  Clinical Narrative:     SW informed by RN, pts car at [facility].   SW spoke with supervisor Verdon Cummins, MSW, approved cab voucher to [facility] once medically ready.   SW met with pt/SO at bedside, provided update.   Expected Discharge Plan: Home w Home Health Services Barriers to Discharge: Continued Medical Work up  Expected Discharge Plan and Services   Discharge Planning Services: CM Consult   Living arrangements for the past 2 months: Single Family Home                                       Social Determinants of Health (SDOH) Interventions SDOH Screenings   Food Insecurity: No Food Insecurity (07/02/2023)  Housing: Low Risk  (07/02/2023)  Transportation Needs: No Transportation Needs (07/02/2023)  Utilities: Not At Risk (07/02/2023)  Tobacco Use: High Risk (07/02/2023)  Health Literacy: High Risk (01/05/2022)   Received from [facility]    Readmission Risk Interventions     No data to display

## 2023-07-14 NOTE — Evaluation (Signed)
Physical Therapy Evaluation and Discharge Patient Details Name: Raymond Roberson MRN: [id number] DOB: 1956-10-23 Today's Date: 07/14/2023  History of Present Illness  67 year old man presented 07/03/23 with palpitations found to be in atrial flutter with RVR with subsequent agitation felt related to alcohol withdrawal subsequently intubated. Extubated 07/06/23; +encephalopathy  PMH-aortic stenosis, CAD s/p CABG, cardiomyopathy, chronic alcohol abuse, migraine, CVA, DM, gouty arthritis, HTN, depression,  Clinical Impression   Patient evaluated by Physical Therapy with no further acute PT needs identified. Patient scored 24/24 on Dynamic Gait Index with no signs of imbalance and denied dizziness throughout. PT is signing off. Thank you for this referral.         Assistance Recommended at Discharge None  If plan is discharge home, recommend the following:  Can travel by private vehicle           Equipment Recommendations None recommended by PT  Recommendations for Other Services       Functional Status Assessment Patient has not had a recent decline in their functional status     Precautions / Restrictions Precautions Precautions: Other (comment) Precaution Comments: watch HR      Mobility  Bed Mobility Overal bed mobility: Independent             General bed mobility comments: standing at bedside on arrival    Transfers Overall transfer level: Independent Equipment used: None                    Ambulation/Gait Ambulation/Gait assistance: Independent Gait Distance (Feet): 300 Feet Assistive device: None Gait Pattern/deviations: WFL(Within Functional Limits)   Gait velocity interpretation: >2.62 ft/sec, indicative of community ambulatory      Stairs            Wheelchair Mobility     Tilt Bed    Modified Rankin (Stroke Patients Only)       Balance Overall balance assessment: Independent                                Standardized Balance Assessment Standardized Balance Assessment : Dynamic Gait Index   Dynamic Gait Index Level Surface: Normal Change in Gait Speed: Normal Gait with Horizontal Head Turns: Normal Gait with Vertical Head Turns: Normal Gait and Pivot Turn: Normal Step Over Obstacle: Normal Step Around Obstacles: Normal Steps: Normal Total Score: 24       Pertinent Vitals/Pain Pain Assessment Pain Assessment: Faces Faces Pain Scale: Hurts little more Pain Location: Rt shoulder Pain Descriptors / Indicators: Aching Pain Intervention(s): Limited activity within patient's tolerance, Patient requesting pain meds-RN notified    Home Living Family/patient expects to be discharged to:: Private residence Living Arrangements: Spouse/significant other Available Help at Discharge: Family;Available 24 hours/day Type of Home: House Home Access: Stairs to enter Entrance Stairs-Rails: Right Entrance Stairs-Number of Steps: 3   Home Layout: One level Home Equipment: Shower seat      Prior Function Prior Level of Function : Independent/Modified Independent                     Hand Dominance        Extremity/Trunk Assessment   Upper Extremity Assessment Upper Extremity Assessment: Defer to OT evaluation    Lower Extremity Assessment Lower Extremity Assessment: Overall WFL for tasks assessed    Cervical / Trunk Assessment Cervical / Trunk Assessment: Normal  Communication   Communication: No difficulties  Cognition Arousal/Alertness: Awake/alert Behavior  During Therapy: WFL for tasks assessed/performed Overall Cognitive Status: Within Functional Limits for tasks assessed                                 General Comments: a&O x3 (time NT); otherwise not assessed; following all commands        General Comments General comments (skin integrity, edema, etc.): Fiance present. Patient with good awareness of lines (esp IV) and being careful not to pull  on it as standing at bedside and trying to reach his breakfast tray    Exercises     Assessment/Plan    PT Assessment Patient does not need any further PT services  PT Problem List         PT Treatment Interventions      PT Goals (Current goals can be found in the Care Plan section)  Acute Rehab PT Goals Patient Stated Goal: Go home PT Goal Formulation: All assessment and education complete, DC therapy    Frequency       Co-evaluation               AM-PAC PT "6 Clicks" Mobility  Outcome Measure Help needed turning from your back to your side while in a flat bed without using bedrails?: None Help needed moving from lying on your back to sitting on the side of a flat bed without using bedrails?: None Help needed moving to and from a bed to a chair (including a wheelchair)?: None Help needed standing up from a chair using your arms (e.g., wheelchair or bedside chair)?: None Help needed to walk in hospital room?: None Help needed climbing 3-5 steps with a railing? : None 6 Click Score: 24    End of Session Equipment Utilized During Treatment: Gait belt Activity Tolerance: Patient tolerated treatment well Patient left: in bed;with call bell/phone within reach;with family/visitor present;with nursing/sitter in room Nurse Communication: Mobility status PT Visit Diagnosis: Other abnormalities of gait and mobility (R26.89)    Time: 0347-4259 PT Time Calculation (min) (ACUTE ONLY): 14 min   Charges:   PT Evaluation $PT Eval Low Complexity: 1 Low   PT General Charges $$ ACUTE PT VISIT: 1 Visit          Jerolyn Center, PT Acute Rehabilitation Services  Office [phone]   Zena Amos 07/14/2023, 9:16 AM

## 2023-07-15 DIAGNOSIS — Z952 Presence of prosthetic heart valve: Secondary | ICD-10-CM | POA: Diagnosis not present

## 2023-07-15 DIAGNOSIS — F10931 Alcohol use, unspecified with withdrawal delirium: Secondary | ICD-10-CM | POA: Diagnosis not present

## 2023-07-15 DIAGNOSIS — I4892 Unspecified atrial flutter: Secondary | ICD-10-CM | POA: Diagnosis not present

## 2023-07-15 LAB — BASIC METABOLIC PANEL
Anion gap: 6 (ref 5–15)
BUN: 9 mg/dL (ref 8–23)
CO2: 25 mmol/L (ref 22–32)
Calcium: 8.7 mg/dL — ABNORMAL LOW (ref 8.9–10.3)
Chloride: 106 mmol/L (ref 98–111)
Creatinine, Ser: 0.92 mg/dL (ref 0.61–1.24)
GFR, Estimated: >60 mL/min (ref >60)
Glucose, Bld: 96 mg/dL (ref 70–99)
Potassium: 3.8 mmol/L (ref 3.5–5.1)
Sodium: 137 mmol/L (ref 135–145)

## 2023-07-15 LAB — CBC
HCT: 35.6 % — ABNORMAL LOW (ref 39.0–52.0)
Hemoglobin: 10.3 g/dL — ABNORMAL LOW (ref 13.0–17.0)
MCH: 22.1 pg — ABNORMAL LOW (ref 26.0–34.0)
MCHC: 28.9 g/dL — ABNORMAL LOW (ref 30.0–36.0)
MCV: 76.2 fL — ABNORMAL LOW (ref 80.0–100.0)
Platelets: 419 10*3/uL — ABNORMAL HIGH (ref 150–400)
RBC: 4.67 MIL/uL (ref 4.22–5.81)
RDW: 19.4 % — ABNORMAL HIGH (ref 11.5–15.5)
WBC: 8.3 10*3/uL (ref 4.0–10.5)
nRBC: 0 % (ref 0.0–0.2)

## 2023-07-15 LAB — MAGNESIUM: Magnesium: 2.4 mg/dL (ref 1.7–2.4)

## 2023-07-15 LAB — GLUCOSE, CAPILLARY
Glucose-Capillary: 78 mg/dL (ref 70–99)
Glucose-Capillary: 93 mg/dL (ref 70–99)
Glucose-Capillary: 100 mg/dL — ABNORMAL HIGH (ref 70–99)

## 2023-07-15 LAB — PHOSPHORUS: Phosphorus: 3.6 mg/dL (ref 2.5–4.6)

## 2023-07-15 MED ORDER — PHENYLEPHRINE-MINERAL OIL-PET 0.25-14-74.9 % RE OINT: 1.0000 | TOPICAL_OINTMENT | Freq: Two times a day (BID) | RECTAL | 0 refills | Status: DC | PRN

## 2023-07-15 MED ORDER — FOLIC ACID 1 MG PO TABS: 1.0000 mg | ORAL_TABLET | Freq: Every day | ORAL | 0 refills | Status: DC

## 2023-07-15 MED ORDER — APIXABAN 5 MG PO TABS: 5.0000 mg | ORAL_TABLET | Freq: Two times a day (BID) | ORAL | 0 refills | Status: DC

## 2023-07-15 MED ORDER — AMIODARONE HCL 200 MG PO TABS: 200.0000 mg | ORAL_TABLET | Freq: Every day | ORAL | 0 refills | Status: DC

## 2023-07-15 MED ORDER — METOPROLOL TARTRATE 50 MG PO TABS: 50.0000 mg | ORAL_TABLET | Freq: Two times a day (BID) | ORAL | 0 refills | Status: DC

## 2023-07-15 MED ORDER — PANTOPRAZOLE SODIUM 40 MG PO TBEC: 40.0000 mg | DELAYED_RELEASE_TABLET | Freq: Every day | ORAL | 0 refills | Status: DC

## 2023-07-15 MED ORDER — NICOTINE 21 MG/24HR TD PT24: 21.0000 mg | MEDICATED_PATCH | Freq: Every day | TRANSDERMAL | 0 refills | Status: DC

## 2023-07-15 MED ORDER — ENSURE ENLIVE PO LIQD: 237.0000 mL | Freq: Three times a day (TID) | ORAL | 12 refills | Status: DC

## 2023-07-15 MED ORDER — HYDROCERIN EX CREA: 1.0000 | TOPICAL_CREAM | Freq: Two times a day (BID) | CUTANEOUS | 0 refills | Status: AC

## 2023-07-15 MED ORDER — OXYCODONE HCL 5 MG PO TABS: 5.0000 mg | ORAL_TABLET | Freq: Four times a day (QID) | ORAL | 0 refills | Status: AC | PRN

## 2023-07-15 MED ORDER — VITAMIN B-1 100 MG PO TABS: 100.0000 mg | ORAL_TABLET | Freq: Every day | ORAL | 0 refills | Status: DC

## 2023-07-15 NOTE — Discharge Summary (Signed)
Physician Discharge Summary   Patient: Raymond Roberson MRN: [id number] DOB: 1956-01-25  Admit date:     07/02/2023  Discharge date: 07/15/23  Discharge Physician: Fran Lowes   PCP: Richardean Chimera, MD   Recommendations at discharge:    Discharge to home. Stop smoking. Follow up with PCP in 7-10 days. Follow up with Dr. Jenene Slicker in 2-4 weeks.  Discharge Diagnoses: Principal Problem:   Alcohol withdrawal (HCC) Active Problems:   Atrial flutter with rapid ventricular response (HCC)   HTN (hypertension)   S/P AVR   Ischemic cardiomyopathy   S/P CABG x 1   Chronic back pain   Alcohol withdrawal with delirium (HCC)   Acute respiratory failure with hypoxia (HCC)   CAP (community acquired pneumonia) due to group B Streptococcus (HCC)   Encephalopathy acute  Resolved Problems:   * No resolved hospital problems. *  Hospital Course: Raymond Roberson is a 67 y.o. M with PMH significant for CAD s/p CABG 12/2022, Ischemic cardiomyopathy, HTN, HFrEF and ETOH abuse with reported 12 beers/day who presented to [facility] with two days of dizziness and shortness of breath.  He stopped drinking thinking that this would help (evening of 7/8), and then developed tremulousness and diaphoresis.   In the ED he was found to be in Afib/flutter and was treated with amiodarone.  He became increasingly agitated and required precedex and eventual intubation so was transferred to [facility].  Labs notable for flat troponin 25>25, ethanol <10, ABG 7.52/30/158/25    On 7/8 presented to [facility] in aflutter and developed worsening ETOH withdrawal requiring intubation. He was transferred to [facility] on 07/03/2023 stable on propofol and CIWA to the care of the ICU. He developed a fever on the night of 7/11. He was started on IV Unasyn on 07/05/2023. Blood cultures x 2, respiratory cultures, and MRSA by PCR were obtained. Blood cultures have had no growth, but respiratory cultures have grown out strep agalactiaea. The unasyn was changed to  rocephin. He has completed this course. The patient was extubated on 07/06/2023 to 4 L Carmi.    The patient was transferred out of the unit to [facility] on 07/10/2023. His mentation was reportedly much more clear on 07/12/2023.    On 07/13/2023 the patient is awake, alert, and oriented x 3. He is complaining bitterly of right shoulder pain. X-ray of the shoulder demonstrates only mild osteoarthritis.  Alcohol withdrawal has resolved.   The amiodarone has been tapered down to 200 mg daily. His heart rate is well controlled in the 60-80 BPM range on the amiodarone and metoprolol 50 mg bid.   The patient will be discharged to home today in fair condition.   Assessment and Plan: * Alcohol withdrawal (HCC) -Presenting with tachycardia, tremulousness, initial diaphoresis.  Alcoholic beverage was last night at about 10 PM. At baseline drinks about 12 beers per day.   -Patient with prior history of withdrawal symptoms -Continue thiamine, folic acid and follow CIWA protocol -Patient intubated for airway protection -Precedex discontinued by PCCM; continue sedation with propofol. -Withdrawal is now resolved.   Atrial flutter with rapid ventricular response (HCC) New onset atrial flutter. Heart rates up to 134.  In the setting of daily alcohol abuse and withdrawal.  . -Appreciate assistance and recommendation by cardiology service -Okay to continue low-dose aspirin and hold Plavix at the moment. -Anticipated cardioversion if patient failed to auto converted on his own -Continue electrolyte repletion with intention/goal for potassium above 4 and magnesium above 2 -Continue  supportive care and adequate hydration.   -Rate is well controlled on amiodarone 200 mg daily and metoprolol 50 mg bid.  -Continue Eliquis for stroke prophylaxis. Stop Plavix  Chronic back pain -Resume home narcotics. Reports intermittent bilateral upper and lower extremity numbness and tingling. No neck pain, no other neurologic  deficits. Hx of alcohol abuse and DM, not on medications. Possible peripheral neuropathy- likely 2/2 alcohol/DM. -Will check B12 level.  S/P CABG x 1 -Recent CABG 12/2022. Also underwent AV replacement. -Patient has been started on Eliquis low-dose aspirin. Plavix stopped for now due to risk of bleeding. -Will also continue metoprolol 50 mg bid.   Ischemic cardiomyopathy -Echo 02/2023, EF of 40 to 45%, grade 1 DD.  Bioprosthetic aortic valve. Not on diuretics. -Continue to follow daily weights and history does not know -Appears to be currently compensated -Follow clinical response and further recommendations by cardiology.  S/P AVR -No significant regurgitation or stenosis from echo in March 2024 -Continue to follow recommendations and further treatment by cardiology service.  HTN (hypertension) -Blood pressure stable at this time.   -Continue current antihypertensive agents.  I have seen and examined this patient myself. I have spent 38 minutes in his evaluation and discharge.      Consultants: Cardiology Procedures performed: None  Disposition: Home Diet recommendation:  Discharge Diet Orders (From admission, onward)     Start     Ordered   07/15/23 0000  Diet - low sodium heart healthy        07/15/23 1332           Cardiac diet DISCHARGE MEDICATION: Allergies as of 07/15/2023       Reactions   Naproxen Nausea Only   Sulfa Antibiotics Nausea Only        Medication List     STOP taking these medications    clopidogrel 75 MG tablet Commonly known as: PLAVIX   losartan 25 MG tablet Commonly known as: COZAAR   ondansetron 8 MG tablet Commonly known as: ZOFRAN       TAKE these medications    ALPRAZolam 0.5 MG tablet Commonly known as: XANAX Take 0.5 mg by mouth 2 (two) times daily.   amiodarone 200 MG tablet Commonly known as: PACERONE Take 1 tablet (200 mg total) by mouth daily. Start taking on: July 16, 2023   apixaban 5 MG Tabs  tablet Commonly known as: ELIQUIS Take 1 tablet (5 mg total) by mouth 2 (two) times daily.   aspirin 81 MG tablet Take 81 mg by mouth daily.   atorvastatin 80 MG tablet Commonly known as: LIPITOR Take 1 tablet (80 mg total) by mouth daily.   citalopram 40 MG tablet Commonly known as: CELEXA Take 20 mg by mouth daily.   esomeprazole 40 MG capsule Commonly known as: NEXIUM Take 40 mg by mouth daily.   feeding supplement Liqd Take 237 mLs by mouth 3 (three) times daily between meals.   folic acid 1 MG tablet Commonly known as: FOLVITE Take 1 tablet (1 mg total) by mouth daily. Start taking on: July 16, 2023   hydrocerin Crea Apply 1 Application topically 2 (two) times daily.   HYDROcodone-acetaminophen 5-325 MG tablet Commonly known as: NORCO/VICODIN Take 1 tablet by mouth 4 (four) times daily.   hydrocortisone 2.5 % rectal cream Commonly known as: ANUSOL-HC Place 1 application  rectally daily as needed for hemorrhoids or anal itching.   levocetirizine 5 MG tablet Commonly known as: XYZAL Take 5 mg by mouth every  evening.   metoprolol tartrate 50 MG tablet Commonly known as: LOPRESSOR Take 1 tablet (50 mg total) by mouth 2 (two) times daily. What changed:  medication strength how much to take   multivitamin with minerals tablet Take 1 tablet by mouth daily.   nicotine 21 mg/24hr patch Commonly known as: NICODERM CQ - dosed in mg/24 hours Place 1 patch (21 mg total) onto the skin daily. Start taking on: July 16, 2023   oxyCODONE 5 MG immediate release tablet Commonly known as: Oxy IR/ROXICODONE Take 1 tablet (5 mg total) by mouth every 6 (six) hours as needed for up to 3 days for severe pain or moderate pain.   pantoprazole 40 MG tablet Commonly known as: PROTONIX Take 1 tablet (40 mg total) by mouth daily. Start taking on: July 16, 2023   phenylephrine-shark liver oil-mineral oil-petrolatum 0.25-14-74.9 % rectal ointment Commonly known as: PREPARATION  H Place 1 Application rectally 2 (two) times daily as needed for hemorrhoids.   sildenafil 100 MG tablet Commonly known as: VIAGRA Take 100 mg by mouth daily as needed for erectile dysfunction.   spironolactone 25 MG tablet Commonly known as: ALDACTONE TAKE 1 TABLET ONCE DAILY   thiamine 100 MG tablet Commonly known as: Vitamin B-1 Take 1 tablet (100 mg total) by mouth daily. Start taking on: July 16, 2023        Follow-up Information     Sharlene Dory, NP Follow up.   Specialty: Cardiology Why: Your appointment is on 07/27/2023 at 2:30 PM.  Please bring all your medications with you to your appointment. Contact information: [address] [place] [address] [phone]                Discharge Exam: Ceasar Mons Weights   07/05/23 0421 07/06/23 0441 07/07/23 0500  Weight: 84.8 kg 84.6 kg 83.1 kg   Exam:  Constitutional:  The patient is awake, alert, and oriented x 3. No acute distress. Respiratory:  No increased work of breathing. No wheezes, rales, or rhonchi No tactile fremitus Cardiovascular:  Regular rate and rhythm No murmurs, ectopy, or gallups. No lateral PMI. No thrills. Abdomen:  Abdomen is soft, non-tender, non-distended No hernias, masses, or organomegaly Normoactive bowel sounds.  Musculoskeletal:  No cyanosis, clubbing, or edema Skin:  No rashes, lesions, ulcers palpation of skin: no induration or nodules Neurologic:  CN 2-12 intact Sensation all 4 extremities intact Psychiatric:  Mental status Mood, affect appropriate Orientation to person, place, time  judgment and insight appear intact   Condition at discharge: fair  The results of significant diagnostics from this hospitalization (including imaging, microbiology, ancillary and laboratory) are listed below for reference.   Imaging Studies: DG Shoulder Right  Result Date: 07/12/2023 CLINICAL DATA:  144615 Pain 144615 EXAM: RIGHT SHOULDER - 2+ VIEW COMPARISON:  None  Available. FINDINGS: No acute fracture or dislocation. No aggressive osseous lesion. Glenohumeral and acromioclavicular joints are normal in alignment. Mild osteoarthritis of the acromioclavicular joint. Mild osteoarthritis of the glenohumeral joint. No soft tissue swelling. No radiopaque foreign bodies. IMPRESSION: 1. Mild osteoarthritis. Electronically Signed   By: Jules Schick M.D.   On: 07/12/2023 15:26   DG CHEST PORT 1 VIEW  Result Date: 07/07/2023 CLINICAL DATA:  Respiratory failure EXAM: PORTABLE CHEST 1 VIEW COMPARISON:  07/05/2023 FINDINGS: Endotracheal tube and gastric catheter have been removed in the interval. Cardiac shadow remains enlarged. Postsurgical changes are again seen. Increased central vascular congestion is noted. Increasing basilar airspace opacity is noted when  compare with the prior study. IMPRESSION: New vascular congestion.  Increased basilar opacity bilaterally. Electronically Signed   By: Alcide Clever M.D.   On: 07/07/2023 23:29   DG Chest Port 1 View  Result Date: 07/05/2023 CLINICAL DATA:  Respiratory failure, status post endotracheal tube placement EXAM: PORTABLE CHEST 1 VIEW COMPARISON:  07/03/2023 FINDINGS: Endotracheal tube and gastric catheter are noted in satisfactory position. Cardiac shadow remains enlarged. Postsurgical changes are again seen. Lungs are well aerated bilaterally. Some increasing basilar opacities are seen left slightly greater than right. No sizable effusion is noted. Old rib fractures are seen on the right. IMPRESSION: Increasing basilar airspace opacity left greater than right. Tubes and lines as described. Electronically Signed   By: Alcide Clever M.D.   On: 07/05/2023 11:45   DG Abd Portable 1V  Result Date: 07/03/2023 CLINICAL DATA:  Orogastric tube placement. EXAM: PORTABLE ABDOMEN - 1 VIEW COMPARISON:  None Available. FINDINGS: The bowel gas pattern is normal. Distal tip of the nasogastric tube is seen in expected position of proximal  stomach. No radio-opaque calculi or other significant radiographic abnormality are seen. IMPRESSION: Distal tip of nasogastric tube seen in expected position of proximal stomach. Electronically Signed   By: Lupita Raider M.D.   On: 07/03/2023 13:40   ECHOCARDIOGRAM LIMITED  Result Date: 07/03/2023    ECHOCARDIOGRAM LIMITED REPORT   Patient Name:   Raymond Roberson Date of Exam: 07/03/2023 Medical Rec #:  [id number]        Height:       72.0 in Accession #:    [id number]       Weight:       192.5 lb Date of Birth:  [birth year]       BSA:          2.096 m Patient Age:    66 years         BP:           106/67 mmHg Patient Gender: M                HR:           63 bpm. [facility] Procedure: Limited Echo, Limited Color Doppler, Cardiac Doppler and Intracardiac            Opacification Agent Indications:    Atrial Flutter I48.92  History:        Patient has prior history of Echocardiogram examinations, most                 recent 03/07/2023. CAD, Prior CABG, Alcohol withdraw, Aortic                 Valve Disease, Arrythmias:Atrial Flutter; Risk                 Factors:Hypertension, Sleep Apnea, Diabetes and Dyslipidemia.                 Aortic Valve: 27 mm Edwards valve is present in the aortic                 position. Procedure Date: 01/23/23.  Sonographer:    Aron Baba Referring Phys: [id number] Ellsworth Lennox  Sonographer Comments: Echo performed with patient supine and on artificial respirator. IMPRESSIONS  1. Inferior sepatal and apical hypokinesis . Left ventricular ejection fraction, by estimation, is 35 to 40%. The left ventricle has moderately decreased function. The left ventricle demonstrates regional wall motion abnormalities (see scoring diagram/findings for  description). The left ventricular internal cavity size was mildly dilated. Left ventricular diastolic parameters are indeterminate.  2. Right ventricular systolic function is normal. The right ventricular size is normal.  3. Left  atrial size was moderately dilated.  4. The mitral valve is abnormal. Mild mitral valve regurgitation. No evidence of mitral stenosis.  5. No PVL normal gradients for valve Peak gradient only 10.8 mmHg mean 7.4 mmHg . The aortic valve has been repaired/replaced. Aortic valve regurgitation is not visualized. No aortic stenosis is present. There is a 27 mm Edwards valve present in the aortic position. Procedure Date: 01/23/23.  6. The inferior vena cava is dilated in size with >50% respiratory variability, suggesting right atrial pressure of 8 mmHg. FINDINGS  Left Ventricle: Inferior sepatal and apical hypokinesis. Left ventricular ejection fraction, by estimation, is 35 to 40%. The left ventricle has moderately decreased function. The left ventricle demonstrates regional wall motion abnormalities. Definity contrast agent was given IV to delineate the left ventricular endocardial borders. The left ventricular internal cavity size was mildly dilated. There is no left ventricular hypertrophy. Left ventricular diastolic parameters are indeterminate. Right Ventricle: The right ventricular size is normal. No increase in right ventricular wall thickness. Right ventricular systolic function is normal. Left Atrium: Left atrial size was moderately dilated. Right Atrium: Right atrial size was normal in size. Pericardium: There is no evidence of pericardial effusion. Mitral Valve: The mitral valve is abnormal. There is mild thickening of the mitral valve leaflet(s). There is mild calcification of the mitral valve leaflet(s). Mild mitral valve regurgitation. No evidence of mitral valve stenosis. Tricuspid Valve: The tricuspid valve is normal in structure. Tricuspid valve regurgitation is not demonstrated. No evidence of tricuspid stenosis. Aortic Valve: No PVL normal gradients for valve Peak gradient only 10.8 mmHg mean 7.4 mmHg. The aortic valve has been repaired/replaced. Aortic valve regurgitation is not visualized. No aortic  stenosis is present. There is a 27 mm Edwards valve present in the aortic position. Procedure Date: 01/23/23. Pulmonic Valve: The pulmonic valve was normal in structure. Pulmonic valve regurgitation is not visualized. No evidence of pulmonic stenosis. Aorta: The aortic root is normal in size and structure. Venous: The inferior vena cava is dilated in size with greater than 50% respiratory variability, suggesting right atrial pressure of 8 mmHg. IAS/Shunts: No atrial level shunt detected by color flow Doppler. Additional Comments: Spectral Doppler performed. Color Doppler performed.  LEFT VENTRICLE PLAX 2D LVIDd:         5.10 cm LVIDs:         4.60 cm LV PW:         1.40 cm LV IVS:        1.10 cm  LEFT ATRIUM         Index LA diam:    4.80 cm 2.29 cm/m  TRICUSPID VALVE TR Peak grad:   23.2 mmHg TR Vmax:        241.00 cm/s Charlton Haws MD Electronically signed by Charlton Haws MD Signature Date/Time: 07/03/2023/10:47:24 AM    Final    DG CHEST PORT 1 VIEW  Result Date: 07/03/2023 CLINICAL DATA:  [id number] Endotracheally intubated [id number] EXAM: PORTABLE CHEST 1 VIEW COMPARISON:  07/02/2023. FINDINGS: Endotracheal tube tip is 6.5 cm from the carina. An enteric tube extends below diaphragm, tip out of the view of the radiograph. There are surgical staples along the heart border and sternotomy wires, status post CABG (coronary artery bypass graft). Prosthetic aortic valve noted. Bilateral lung fields are clear.  Bilateral costophrenic angles are clear. Stable cardio-mediastinal silhouette. No acute osseous abnormalities. Old healed right lateral rib fractures noted. The soft tissues are within normal limits. IMPRESSION: 1. Endotracheal tube tip is 6.5 cm from the carina. 2. Rest of the tubes and lines, as described above. 3. No acute cardiopulmonary abnormality. Electronically Signed   By: Jules Schick M.D.   On: 07/03/2023 08:37   DG Chest Portable 1 View  Result Date: 07/02/2023 CLINICAL DATA:  Intubation and  orogastric tube. EXAM: PORTABLE CHEST 1 VIEW COMPARISON:  Chest x-ray 07/02/2023 FINDINGS: Endotracheal tube tip is 4.4 cm above the carina. Enteric tube extends below the diaphragm into the stomach. The distal tip is not included on the image. The heart is enlarged. Patient is status post cardiac valve replacement. There is no focal lung infiltrate, pleural effusion, or pneumothorax. IMPRESSION: Endotracheal tube tip is 4.4 cm above the carina. Enteric tube extends below the diaphragm into the stomach. Electronically Signed   By: Darliss Cheney M.D.   On: 07/02/2023 23:41   DG Chest Port 1 View  Result Date: 07/02/2023 CLINICAL DATA:  Hypertension EXAM: PORTABLE CHEST 1 VIEW COMPARISON:  03/07/2023 FINDINGS: Stable cardiomegaly status post sternotomy and aortic valve replacement. Minimal left basilar scarring or atelectasis. Lungs are otherwise clear. No pleural effusion or pneumothorax. IMPRESSION: Minimal left basilar scarring or atelectasis. No acute cardiopulmonary findings. Electronically Signed   By: Duanne Guess D.O.   On: 07/02/2023 15:16    Microbiology: Results for orders placed or performed during the hospital encounter of 07/02/23  MRSA Next Gen by PCR, Nasal     Status: None   Collection Time: 07/02/23  6:48 PM   Specimen: Nasal Mucosa; Nasal Swab  Result Value Ref Range Status   MRSA by PCR Next Gen NOT DETECTED NOT DETECTED Final    Comment: (NOTE) The GeneXpert MRSA Assay (FDA approved for NASAL specimens only), is one component of a comprehensive MRSA colonization surveillance program. It is not intended to diagnose MRSA infection nor to guide or monitor treatment for MRSA infections. Test performance is not FDA approved in patients less than 7 years old. Performed at [facility], [address], [place], [place] [address]   Culture, Respiratory w Gram Stain     Status: None   Collection Time: 07/05/23  7:24 AM   Specimen: Tracheal Aspirate; Respiratory  Result Value Ref  Range Status   Specimen Description TRACHEAL ASPIRATE  Final   Special Requests NONE  Final   Gram Stain   Final    ABUNDANT WBC PRESENT, PREDOMINANTLY PMN ABUNDANT GRAM POSITIVE COCCI IN PAIRS MODERATE GRAM NEGATIVE RODS FEW GRAM POSITIVE RODS    Culture   Final    ABUNDANT GROUP B STREP(S.AGALACTIAE)ISOLATED TESTING AGAINST S. AGALACTIAE NOT ROUTINELY PERFORMED DUE TO PREDICTABILITY OF AMP/PEN/VAN SUSCEPTIBILITY. WITHIN MIXED NORMAL RESPIRATORY FLORA Performed at [facility], [address] [place], [place] [address]    Report Status 07/08/2023 FINAL  Final  Culture, blood (Routine X 2) w Reflex to ID Panel     Status: None   Collection Time: 07/05/23  9:29 AM   Specimen: BLOOD LEFT HAND  Result Value Ref Range Status   Specimen Description BLOOD LEFT HAND  Final   Special Requests   Final    BOTTLES DRAWN AEROBIC AND ANAEROBIC Blood Culture adequate volume   Culture   Final    NO GROWTH 5 DAYS Performed at [facility], [address] [place], [place]  [address]    Report Status 07/10/2023 FINAL  Final  Culture, blood (Routine X 2) w Reflex to ID Panel     Status: None   Collection Time: 07/05/23  9:29 AM   Specimen: BLOOD LEFT HAND  Result Value Ref Range Status   Specimen Description BLOOD LEFT HAND  Final   Special Requests   Final    BOTTLES DRAWN AEROBIC AND ANAEROBIC Blood Culture adequate volume   Culture   Final    NO GROWTH 5 DAYS Performed at [facility], [address] [place], [place] [address]    Report Status 07/10/2023 FINAL  Final    Labs: CBC: Recent Labs  Lab 07/11/23 0545 07/12/23 0347 07/13/23 0825 07/14/23 0318 07/15/23 0519  WBC 7.4 8.4 9.5 8.1 8.3  HGB 9.9* 10.1* 10.8* 10.5* 10.3*  HCT 33.8* 35.0* 37.5* 36.0* 35.6*  MCV 75.4* 75.9* 77.6* 77.3* 76.2*  PLT 315 367 475* 446* 419*   Basic Metabolic Panel: Recent Labs  Lab 07/10/23 0210 07/11/23 0545 07/12/23 0347 07/13/23 0825 07/14/23 0318 07/15/23 0519  NA  137 136 136  --  137 137  K 3.1* 3.7 3.8  --  3.7 3.8  CL 102 105 105  --  104 106  CO2 27 24 24   --  26 25  GLUCOSE 78 85 86  --  87 96  BUN 9 7* 7*  --  7* 9  CREATININE 0.78 0.82 0.80  --  0.85 0.92  CALCIUM 8.4* 8.4* 8.3*  --  8.8* 8.7*  MG 2.2 2.3 2.5*  --  2.5* 2.4  PHOS 3.0 3.2 3.2 2.8 3.5 3.6   Liver Function Tests: No results for input(s): "AST", "ALT", "ALKPHOS", "BILITOT", "PROT", "ALBUMIN" in the last 168 hours. CBG: Recent Labs  Lab 07/14/23 2038 07/14/23 2354 07/15/23 0401 07/15/23 0813 07/15/23 1215  GLUCAP 96 88 78 93 100*    Discharge time spent: greater than 30 minutes.  Signed: Ranee Peasley, DO [facility] 07/15/2023

## 2023-07-15 NOTE — TOC Progression Note (Signed)
Transition of Care [facility]) - Progression Note    Patient Details  Name: Raymond Roberson MRN: [id number] Date of Birth: [birth year]  Transition of [facility] CM/SW Contact  Donnalee Curry, LCSWA Phone Number: 07/15/2023, 4:13 PM  Clinical Narrative:     SW left cab voucher on pt chart, informed RN to call [facility] once pt ready for d/c.    Expected Discharge Plan: Home w Home Health Services Barriers to Discharge: Continued Medical Work up  Expected Discharge Plan and Services   Discharge Planning Services: CM Consult   Living arrangements for the past 2 months: Single Family Home Expected Discharge Date: 07/15/23                                     Social Determinants of Health (SDOH) Interventions SDOH Screenings   Food Insecurity: No Food Insecurity (07/02/2023)  Housing: Low Risk  (07/02/2023)  Transportation Needs: No Transportation Needs (07/02/2023)  Utilities: Not At Risk (07/02/2023)  Tobacco Use: High Risk (07/02/2023)  Health Literacy: High Risk (01/05/2022)   Received from [facility], [facility]    Readmission Risk Interventions     No data to display

## 2023-07-20 ENCOUNTER — Telehealth: Payer: Self-pay | Admitting: Internal Medicine

## 2023-07-20 NOTE — Telephone Encounter (Signed)
Patient called to report persistent arm pain that "started once [he] was put on new medications." On further discussion with the patient and chart review, he is recently discharged following hospital stay during which time he had agitation and required restraint which resulted in arm trauma. He reported persistent shoulder pain during the hospitalization which was evaluated with XR, revealing mild OA. The patient continues to have 10/10 persistent arm pain primarily at the level of the biceps that is mildly responsive to analgesics, narcotics, and heat. The pain has been ongoing since discharge on 7/21 and this is his first call about it. He has preserved ROM, denies numbness, erythema, induration.  Ultimately, the pain is most likely due to musculoskeletal injury given recent mechanism of trauma. No red flag symptoms. Explained to patient that new medications not the cause of pain. He was encouraged to seek out the Emergency Room or if tolerable, to follow up with his PMD. He was instructed to continue analgesics, heat and massage of the area of pain.  The patient has elected to seek care via Urgent Care in the AM vs PMD. He was provided return precautions and verbalized agreement and understanding to the plan.  Deitra Mayo, MD MPH [facility]

## 2023-07-27 ENCOUNTER — Ambulatory Visit: Payer: Medicare HMO | Admitting: Nurse Practitioner

## 2023-07-27 DIAGNOSIS — E7849 Other hyperlipidemia: Secondary | ICD-10-CM | POA: Diagnosis not present

## 2023-07-27 DIAGNOSIS — K2100 Gastro-esophageal reflux disease with esophagitis, without bleeding: Secondary | ICD-10-CM | POA: Diagnosis not present

## 2023-07-27 DIAGNOSIS — E1165 Type 2 diabetes mellitus with hyperglycemia: Secondary | ICD-10-CM | POA: Diagnosis not present

## 2023-07-27 DIAGNOSIS — E782 Mixed hyperlipidemia: Secondary | ICD-10-CM | POA: Diagnosis not present

## 2023-07-27 DIAGNOSIS — R5383 Other fatigue: Secondary | ICD-10-CM | POA: Diagnosis not present

## 2023-07-31 DIAGNOSIS — M19011 Primary osteoarthritis, right shoulder: Secondary | ICD-10-CM | POA: Diagnosis not present

## 2023-07-31 DIAGNOSIS — M25511 Pain in right shoulder: Secondary | ICD-10-CM | POA: Diagnosis not present

## 2023-07-31 DIAGNOSIS — S40251A Superficial foreign body of right shoulder, initial encounter: Secondary | ICD-10-CM | POA: Diagnosis not present

## 2023-08-01 DIAGNOSIS — J449 Chronic obstructive pulmonary disease, unspecified: Secondary | ICD-10-CM | POA: Diagnosis not present

## 2023-08-01 DIAGNOSIS — F331 Major depressive disorder, recurrent, moderate: Secondary | ICD-10-CM | POA: Diagnosis not present

## 2023-08-01 DIAGNOSIS — E7849 Other hyperlipidemia: Secondary | ICD-10-CM | POA: Diagnosis not present

## 2023-08-01 DIAGNOSIS — M25511 Pain in right shoulder: Secondary | ICD-10-CM | POA: Diagnosis not present

## 2023-08-01 DIAGNOSIS — Z6824 Body mass index (BMI) 24.0-24.9, adult: Secondary | ICD-10-CM | POA: Diagnosis not present

## 2023-08-01 DIAGNOSIS — R030 Elevated blood-pressure reading, without diagnosis of hypertension: Secondary | ICD-10-CM | POA: Diagnosis not present

## 2023-08-01 DIAGNOSIS — K2100 Gastro-esophageal reflux disease with esophagitis, without bleeding: Secondary | ICD-10-CM | POA: Diagnosis not present

## 2023-08-01 NOTE — Telephone Encounter (Signed)
Spoke with patient - states he has just had a lot going on & having to take care of a shoulder issue currently.  Follow up scheduled for 09/05/23 at 4:00 with Sharlene Dory, NP in [place] office.

## 2023-08-03 DIAGNOSIS — C44319 Basal cell carcinoma of skin of other parts of face: Secondary | ICD-10-CM | POA: Diagnosis not present

## 2023-08-10 DIAGNOSIS — M25511 Pain in right shoulder: Secondary | ICD-10-CM | POA: Diagnosis not present

## 2023-08-13 ENCOUNTER — Other Ambulatory Visit: Payer: Self-pay | Admitting: Internal Medicine

## 2023-08-13 ENCOUNTER — Telehealth: Payer: Self-pay | Admitting: Internal Medicine

## 2023-08-13 MED ORDER — ATORVASTATIN CALCIUM 80 MG PO TABS: 80.0000 mg | ORAL_TABLET | Freq: Every day | ORAL | 6 refills | Status: DC

## 2023-08-13 MED ORDER — SPIRONOLACTONE 25 MG PO TABS: 25.0000 mg | ORAL_TABLET | Freq: Every day | ORAL | 8 refills | Status: DC

## 2023-08-13 NOTE — Telephone Encounter (Signed)
Spironolactone and atorvastatin filled by dr Jenene Slicker other medications never filled by [place]

## 2023-08-13 NOTE — Telephone Encounter (Signed)
Patient is following up. He is very concerned because he is completely out of medication. Patient states [facility] needs to take the prescription verbally because they are having issues with electronic prescriptions.

## 2023-08-13 NOTE — Telephone Encounter (Signed)
*  STAT* If patient is at the pharmacy, call can be transferred to refill team.   1. Which medications need to be refilled? (please list name of each medication and dose if known) amiodarone (PACERONE) 200 MG tablet   apixaban (ELIQUIS) 5 MG TABS tablet    folic acid (FOLVITE) 1 MG tablet  spironolactone (ALDACTONE) 25 MG tablet   atorvastatin (LIPITOR) 80 MG tablet  metoprolol tartrate (LOPRESSOR) 50 MG tablet   2. Which pharmacy/location (including street and city if local pharmacy) is medication to be sent to? [facility] - [place], [place] - [address]   3. Do they need a 30 day or 90 day supply?  90 Day Supply  Pt is currently out of the medication.

## 2023-08-14 ENCOUNTER — Telehealth: Payer: Self-pay | Admitting: Internal Medicine

## 2023-08-14 ENCOUNTER — Other Ambulatory Visit: Payer: Self-pay | Admitting: Nurse Practitioner

## 2023-08-14 MED ORDER — AMIODARONE HCL 200 MG PO TABS: 200.0000 mg | ORAL_TABLET | Freq: Every day | ORAL | 0 refills | Status: DC

## 2023-08-14 MED ORDER — METOPROLOL TARTRATE 50 MG PO TABS: 50.0000 mg | ORAL_TABLET | Freq: Two times a day (BID) | ORAL | 0 refills | Status: DC

## 2023-08-14 MED ORDER — APIXABAN 5 MG PO TABS: 5.0000 mg | ORAL_TABLET | Freq: Two times a day (BID) | ORAL | 0 refills | Status: DC

## 2023-08-14 NOTE — Telephone Encounter (Signed)
Raymond Roberson has agreed to fill all medications except folic acid all other medication refills have been sent to pharmacy

## 2023-08-14 NOTE — Telephone Encounter (Signed)
Raymond Roberson has agreed to fill all medications except folic acid all other medications have been sent to pharmacy

## 2023-08-14 NOTE — Telephone Encounter (Signed)
  Patient calling the office for samples of medication:   1.  What medication and dosage are you requesting samples for?apixaban (ELIQUIS) 5 MG TABS tablet   2.  Are you currently out of this medication? Yes   Pt said, he is out of meds and he can't afford to get the Eliquis right now

## 2023-08-15 NOTE — Telephone Encounter (Signed)
Left patient voicemail that we do not have any eliquis samples at this time but his refills have been sent in

## 2023-09-05 ENCOUNTER — Ambulatory Visit: Payer: Medicare HMO | Attending: Nurse Practitioner | Admitting: Nurse Practitioner

## 2023-09-05 ENCOUNTER — Encounter: Payer: Self-pay | Admitting: Nurse Practitioner

## 2023-09-05 VITALS — BP 160/82 | HR 77 | Ht 72.0 in | Wt 186.8 lb

## 2023-09-05 DIAGNOSIS — I4892 Unspecified atrial flutter: Secondary | ICD-10-CM

## 2023-09-05 DIAGNOSIS — Z952 Presence of prosthetic heart valve: Secondary | ICD-10-CM | POA: Diagnosis not present

## 2023-09-05 DIAGNOSIS — R110 Nausea: Secondary | ICD-10-CM | POA: Diagnosis not present

## 2023-09-05 DIAGNOSIS — E7849 Other hyperlipidemia: Secondary | ICD-10-CM | POA: Diagnosis not present

## 2023-09-05 DIAGNOSIS — I5022 Chronic systolic (congestive) heart failure: Secondary | ICD-10-CM | POA: Diagnosis not present

## 2023-09-05 DIAGNOSIS — I5023 Acute on chronic systolic (congestive) heart failure: Secondary | ICD-10-CM

## 2023-09-05 DIAGNOSIS — Z951 Presence of aortocoronary bypass graft: Secondary | ICD-10-CM

## 2023-09-05 DIAGNOSIS — Z720 Tobacco use: Secondary | ICD-10-CM

## 2023-09-05 DIAGNOSIS — I10 Essential (primary) hypertension: Secondary | ICD-10-CM | POA: Diagnosis not present

## 2023-09-05 DIAGNOSIS — I2510 Atherosclerotic heart disease of native coronary artery without angina pectoris: Secondary | ICD-10-CM

## 2023-09-05 DIAGNOSIS — M791 Myalgia, unspecified site: Secondary | ICD-10-CM | POA: Diagnosis not present

## 2023-09-05 DIAGNOSIS — R6882 Decreased libido: Secondary | ICD-10-CM

## 2023-09-05 DIAGNOSIS — F1011 Alcohol abuse, in remission: Secondary | ICD-10-CM

## 2023-09-05 MED ORDER — ATORVASTATIN CALCIUM 40 MG PO TABS: 40.0000 mg | ORAL_TABLET | Freq: Every day | ORAL | 4 refills | Status: DC

## 2023-09-05 MED ORDER — METOPROLOL TARTRATE 25 MG PO TABS: 25.0000 mg | ORAL_TABLET | Freq: Two times a day (BID) | ORAL | 4 refills | Status: DC

## 2023-09-05 NOTE — Progress Notes (Unsigned)
Cardiology Office Note:  .   Date:  09/05/2023 ID:  Vira Browns, DOB 10/14/1956, MRN [id number] PCP: Richardean Chimera, MD  [facility] Providers Cardiologist:  Marjo Bicker, MD    History of Present Illness: .   Raymond Roberson is a 67 y.o. male with a PMH of CAD, hx of NSTEMI and s/p CABG x1 (LIMA-LAD), HFmrEF/ICM, severe aortic valve stenosis due to bicuspid aortic valve, s/p SAVR, HLD, HTN, who presents today for hospital follow-up.   Admitted to [facility] in January 2024 with NSTEMI.  Left heart cath revealed proximal LAD stenosis and CTO RCA.  TTE revealed EF 30 to 35% with severe aortic valve stenosis, underlying bicuspid aortic valve and mild AI.  Managed for cardiogenic shock on milrinone.  Patient was stabilized and left AMA.  TTE post CABG revealed EF improved but mildly reduced to 40 to 45%.  Beta-blocker has been deferred in outpatient clinic due to bradycardia, heart rate we found a 45 bpm.  Seen by Dr. Jenene Slicker on April 10, 2023.  Patient denied and CP, admitted to left arm tingling for the last 1 to 2 months.  Was on aspirin and not on Plavix.  Could not tolerate Jardiance-reported having symptoms of memory changes and confusion while on medication. HR 74 bpm, started on Lopressor 12.5 mg BID and restarted on Plavix. TTE ordered to be completed in 3 months.   Hospital admission 06/2023 for alcohol withdrawal.  Stopped drinking and developed tremulousness and diaphoresis, reported drinking 12 beers per day and developed 2 days of dizziness and shortness of breath, eventually presented to [facility].  In the ED he was found to be in A-fib/A-flutter, treated with amiodarone.  Also became increasingly agitated, required Precedex and intubation and was transferred to [facility] and was treated in ICU.  Flat troponins, ethanol less than 10. ABG - 7/52/30/158/25. Hospital course noted by fever, was started on IV antibiotics.  Respiratory cultures grew out strep agalactiaea.   Unasyn changed to Rocephin.  Blood cultures negative.  Did report shoulder pain, x-ray shoulder noted mild osteoarthritis.  Alcohol withdrawal resolved.  Amiodarone tapered down to 200 mg daily.  Heart rate was well-controlled. Limited Echo revealed EF 35 to 40%.   Today he presents for overdue hospital follow-up with his fiance.  Says he does not feel good and is stressed.  He says his new medications are not making him feel good, have affected his sex drive, which is his chief concern.  Says he has been tempted to come off his medications, however his fiance has told him he needs to continue to take his medications. Denies any chest pain, shortness of breath, palpitations, syncope, presyncope, dizziness, orthopnea, PND, swelling or significant weight changes, acute bleeding, or claudication. Says he was feeling good last time he was in the office, and now not feeling good. Admits to nausea and muscle aches/pain. Says SBP's run soft, fianc says sometimes SBP has been running low around 80's-90's at times. Checks BP multiple times per day. Drinks sodas.   SH: Occasionally drinks alcohol, denies tobacco use, but dips tobacco.   ROS: Negative  Studies Reviewed: Marland Kitchen    EKG: EKG Interpretation Date/Time:  Wednesday September 05 2023 16:44:19 EDT Ventricular Rate:  71 PR Interval:    QRS Duration:  98 QT Interval:  414 QTC Calculation: 449 R Axis:   -23  Text Interpretation: Atrial flutter with variable A-V block Minimal voltage criteria for LVH, may be normal variant (  [employer] product ) Anterior infarct , age undetermined T wave abnormality, consider lateral ischemia When compared with ECG of 12-Jul-2023 12:02, Criteria for Inferior infarct are no longer Present Confirmed by Sharlene Dory [phone]) on 09/06/2023 8:13:44 AM   Limited Echo 06/2023:  1. Inferior sepatal and apical hypokinesis . Left ventricular ejection  fraction, by estimation, is 35 to 40%. The left ventricle has moderately   decreased function. The left ventricle demonstrates regional wall motion  abnormalities (see scoring  diagram/findings for description). The left ventricular internal cavity  size was mildly dilated. Left ventricular diastolic parameters are  indeterminate.   2. Right ventricular systolic function is normal. The right ventricular  size is normal.   3. Left atrial size was moderately dilated.   4. The mitral valve is abnormal. Mild mitral valve regurgitation. No  evidence of mitral stenosis.   5. No PVL normal gradients for valve Peak gradient only 10.8 mmHg mean  7.4 mmHg . The aortic valve has been repaired/replaced. Aortic valve  regurgitation is not visualized. No aortic stenosis is present. There is a  27 mm Edwards valve present in the  aortic position. Procedure Date: 01/23/23.   6. The inferior vena cava is dilated in size with >50% respiratory  variability, suggesting right atrial pressure of 8 mmHg.  Echo complete 02/2023:  1. Left ventricular ejection fraction, by estimation, is 40 to 45%. The  left ventricle has mildly decreased function. The left ventricle  demonstrates regional wall motion abnormalities (see scoring  diagram/findings for description). There is mild left  ventricular hypertrophy. Left ventricular diastolic parameters are  consistent with Grade I diastolic dysfunction (impaired relaxation).   2. Right ventricular systolic function is normal. The right ventricular  size is normal.   3. The mitral valve is normal in structure. Trivial mitral valve  regurgitation. No evidence of mitral stenosis.   4. The aortic valve has been repaired/replaced. Aortic valve  regurgitation is not visualized. No aortic stenosis is present. There is a  bioprosthetic valve present in the aortic position.   5. The inferior vena cava is normal in size with greater than 50%  respiratory variability, suggesting right atrial pressure of 3 mmHg.  Vascular ultrasound pre-CABG 12/2022:   Summary:  Right Carotid: Velocities in the right ICA are consistent with a 1-39%  stenosis.   Left Carotid: Velocities in the left ICA are consistent with a 1-39%  stenosis.  Vertebrals: Bilateral vertebral arteries demonstrate antegrade flow.  Subclavians: Normal flow hemodynamics were seen in bilateral subclavian arteries.   Right ABI: Resting right ankle-brachial index is within normal range. The right toe-brachial index is normal.  Left ABI: Resting left ankle-brachial index is within normal range. The left toe-brachial index is normal.  TEE 12/2022:  Complications: No known complications during this procedure.  POST-OP IMPRESSIONS  _ Aortic Valve: No stenosis present. A bioprosthetic valve was placed,  leaflets  are freely mobile and thin. Manufactured by; inspiris Size; 27mm. There is  no  regurgitation. The gradient recorded across the prosthetic valve is within  the  expected range. No perivalvular leak noted.   PRE-OP FINDINGS   Left Ventricle: The left ventricle has moderate-severely reduced systolic  function, with an ejection fraction of 30-35%. The cavity size was mildly  dilated. There is no increase in left ventricular wall thickness. Left  ventrical global hypokinesis  without regional wall motion abnormalities.    Right Ventricle: The right ventricle has normal systolic function. The  cavity was normal. There  is no increase in right ventricular wall  thickness.   Left Atrium: Left atrial size was dilated. No left atrial/left atrial  appendage thrombus was detected.   Right Atrium: Right atrial size was normal in size.   Interatrial Septum: No atrial level shunt detected by color flow Doppler.   Pericardium: There is no evidence of pericardial effusion.   Mitral Valve: The mitral valve is normal in structure. Mitral valve  regurgitation is mild by color flow Doppler. The MR jet is  centrally-directed. There is No evidence of mitral stenosis.    Tricuspid Valve: The tricuspid valve was normal in structure. Tricuspid  valve regurgitation is trivial by color flow Doppler.   Aortic Valve: The aortic valve is bicuspid Aortic valve regurgitation is  mild by color flow Doppler. The jet is anteriorly-directed. There is  severe stenosis of the aortic valve. There is moderate aortic annular  calcification noted.  Heavily calcified native aortic valve.   Pulmonic Valve: The pulmonic valve was normal in structure.  Pulmonic valve regurgitation is not visualized by color flow Doppler.    Aorta: The aortic root, ascending aorta and aortic arch are normal in size  and structure.     Right heart cath 12/2022:  HEMODYNAMICS:    1: Right atrial pressure-9/8 2: Right ventricular pressure-40/0 3: Pulmonary artery pressure-38/10, mean 24 4: Pulmonary wedge pressure-A-wave 26, V wave 26, mean 17 5: Cardiac output-5.15 L/min with an index of 2.53 L/min/m     IMPRESSION: Mr. Ekstrom has severe aortic stenosis (bicuspid aortic valve) with moderately severe LV dysfunction and two-vessel disease.  He has a large dominant RCA that is occluded at the ostium with grade 3 left-to-right collaterals and a high-grade calcified proximal LAD stenosis.  The case has been reviewed with Dr. Royann Shivers, the patient's attending cardiologist, and Dr. Excell Seltzer, structural interventionalists who feel best option is CABG/SAVR.  The radial sheath was removed and a TR band was placed on the right wrist to achieve patent hemostasis.  Patient left lab in stable condition.  Patient will return to his room and TCTS will be obtained.  Echo 12/2022:  1. Calcified aortic valve with inderminant number of cusps; probable  severe AS with mean gradient as high as 37 mmHg; mild AI.   2. Left ventricular ejection fraction, by estimation, is 30 to 35%. The  left ventricle has moderate to severely decreased function. The left  ventricle demonstrates global hypokinesis. The left  ventricular internal  cavity size was moderately dilated.  Left ventricular diastolic parameters are consistent with Grade III  diastolic dysfunction (restrictive).   3. Right ventricular systolic function is mildly reduced. The right  ventricular size is normal.   4. The mitral valve is degenerative. Mild mitral valve regurgitation. No  evidence of mitral stenosis.   5. The aortic valve is calcified. Aortic valve regurgitation is mild.  Severe aortic valve stenosis. Aortic regurgitation PHT measures 220 msec.  Aortic valve area, by VTI measures 1.43 cm. Aortic valve mean gradient  measures 33.7 mmHg. Aortic valve Vmax   measures 3.63 m/s.   6. The inferior vena cava is dilated in size with >50% respiratory  variability, suggesting right atrial pressure of 8 mmHg.  Risk Assessment/Calculations:    CHA2DS2-VASc Score = 3  This indicates a 3.2% annual risk of stroke. The patient's score is based upon: CHF History: 1 HTN History: 1 Diabetes History: 0 Stroke History: 0 Vascular Disease History: 1 Age Score: 0 Gender Score: 0  Physical Exam:   VS:  BP 134/82 (BP Location: Left Arm, Patient Position: Sitting, Cuff Size: Normal)   Pulse 77   Ht 6' (1.829 m)   Wt 186 lb 12.8 oz (84.7 kg)   SpO2 94%   BMI 25.33 kg/m    Wt Readings from Last 3 Encounters:  09/05/23 186 lb 12.8 oz (84.7 kg)  07/07/23 183 lb 3.2 oz (83.1 kg)  04/10/23 184 lb 12.8 oz (83.8 kg)    GEN: Well nourished, well developed in no acute distress NECK: No JVD; No carotid bruits CARDIAC: S1/S2, irregularly irregular, no murmurs, rubs, gallops RESPIRATORY:  Clear to auscultation without rales, wheezing or rhonchi  ABDOMEN: Soft, non-tender, non-distended EXTREMITIES:  No edema; No deformity  PSYCH: Calm, cooperative, appears nervous, fidgety   ASSESSMENT AND PLAN: .    Chronic systolic CHF, HFrEF Stage C, NYHA class I symptoms. Limited Echo 06/2023 revealed drop in EF to 35-40% from previous 40-45%.  Euvolemic and well compensated on exam. Drop in EF felt likely d/t hx of ETOH abuse. Hx of soft BP's at home and drop in BP's per fianc's report.  Will reduce Lopressor to 25 mg twice daily.  Continue spironolactone. Hesitant to start ARNI/ACEI/ARB d/t BP trends - see #5 below. Low sodium diet, fluid restriction <2L, and daily weights encouraged. Educated to contact our office for weight gain of 2 lbs overnight or 5 lbs in one week. Hesitant about starting SGLT2i at this time d/t his current symptoms.    A-flutter Recent hospital admission and was found to be in A-fib/a flutter in setting of alcohol withdrawal.  EKG today shows he remains to be in A-flutter. Denies any tachycardia or palpitations, chief concern is low sex drive - see #7. Continue amiodarone, reducing Lopressor as mentioned above to help improve BP. Continue Eliquis 5 mg BID due to CHA2DS2-VASc score is 3, denies any bleeding issues.  He is on appropriate dosage for this medication.  We discussed avoiding triggers to A-fib/A-flutter. At next visit, consider DCCV or TEE/DCCV if continued A-fib/A-flutter.  Discussed the importance of avoiding alcohol due to his arrhythmia.  He verbalized understanding. Will obtain thyroid panel, LFT in 6 weeks.   CAD, s/p CABG Underwent one-vessel CABG in January 2024 (LIMA-LAD), has known CTO RCA.  Troponins were flat during most recent hospital admission.  Continue aspirin, and will reduce Lipitor to 40 mg daily to improve myalgias - see below.  Plavix was stopped previously due to Eliquis being started. Heart healthy diet and regular cardiovascular exercise encouraged.  Care and ED precautions discussed.  Severe aortic valve stenosis, s/p SAVR He is status post SAVR in 12/2022 with 27 mm Edwards Inspiris Resilia. Recent limited Echo revealed stable function. Will continue to monitor.   HTN Blood pressure on arrival 160/60, repeat BP 134/82.  He does admit to recent stresses he does not feel good, police  is coming from his medications.  Fianc also admits to soft BPs at times and SBP occasionally around 80-90's. Reducing Lopressor to 25 mg BID.  Continue spironolactone. Given BP log and discussed to monitor BP at home at least 2 hours after medications and sitting for 5-10 minutes.  Encouraged him to stay well hydrated and drink water around < 2 L per day (see #1). He will let us know his BP readings in 2 weeks. Heart healthy diet and regular cardiovascular exercise encouraged.   HLD, myalgias, nausea Patient does admit to recent muscle sore/aches and nausea.  Etiology multifactorial and  could be partly d/t previous hospital course.  Will reduce atorvastatin to 40 mg daily to see if this makes a difference.  He will let us know in 1 to 2 weeks how his symptoms are doing. Continue to follow with PCP. Will obtain FLP in 6 weeks.   Hx of ETOH abuse, chewing tobacco use Discussed importance of abstaining from alcohol and chewing tobacco. He verbalized understanding.   Low libido His new cardiac medications including amiodarone and metoprolol may be most likely contributing to this.  Reducing metoprolol to 25 mg twice daily as mentioned above.  Continue amiodarone 200 mg at this time, as recurrence of arrhythmia may be likely due to his past history of EtOH abuse.  Viagra has been previously prescribed by another provider.  Continue to follow with PCP.    Dispo: Follow-up with me or APP in 8 weeks or sooner if anything changes.  Signed, Sharlene Dory, NP

## 2023-09-05 NOTE — Patient Instructions (Addendum)
Medication Instructions:  Your physician has recommended you make the following change in your medication:  Reduce Lopressor to 25 Mg Twice a day Reduce Lipitor to 40 Mg daily  Continue all other medications as Prescribed   Labwork: In 6 weeks Thyroid, LFT, FLP  Testing/Procedures: None  Follow-Up: Your physician recommends that you schedule a follow-up appointment in: 8 weeks   Any Other Special Instructions Will Be Listed Below (If Applicable).  If you need a refill on your cardiac medications before your next appointment, please call your pharmacy.

## 2023-09-06 ENCOUNTER — Encounter: Payer: Self-pay | Admitting: Nurse Practitioner

## 2023-09-07 ENCOUNTER — Other Ambulatory Visit: Payer: Self-pay | Admitting: Nurse Practitioner

## 2023-09-17 DIAGNOSIS — C44319 Basal cell carcinoma of skin of other parts of face: Secondary | ICD-10-CM | POA: Diagnosis not present

## 2023-10-10 DIAGNOSIS — J449 Chronic obstructive pulmonary disease, unspecified: Secondary | ICD-10-CM | POA: Diagnosis not present

## 2023-10-10 DIAGNOSIS — I10 Essential (primary) hypertension: Secondary | ICD-10-CM | POA: Diagnosis not present

## 2023-10-10 DIAGNOSIS — Z1329 Encounter for screening for other suspected endocrine disorder: Secondary | ICD-10-CM | POA: Diagnosis not present

## 2023-10-10 DIAGNOSIS — E7849 Other hyperlipidemia: Secondary | ICD-10-CM | POA: Diagnosis not present

## 2023-10-10 DIAGNOSIS — E1165 Type 2 diabetes mellitus with hyperglycemia: Secondary | ICD-10-CM | POA: Diagnosis not present

## 2023-10-10 DIAGNOSIS — Z79891 Long term (current) use of opiate analgesic: Secondary | ICD-10-CM | POA: Diagnosis not present

## 2023-10-11 ENCOUNTER — Telehealth: Payer: Self-pay | Admitting: Nurse Practitioner

## 2023-10-11 NOTE — Telephone Encounter (Signed)
Patient states that he has been drinking some beer lately and it has caused his BP to  elevate..States that he hasn't had anything to drink in 2 days.He has been taking the Metroprolol 50 mg in the AM and 50 mg in the evening for several days.

## 2023-10-11 NOTE — Telephone Encounter (Signed)
Spoke with patient - states that he had been drinking previously that is running his BP up.  States that his BP has been running 106-170's / 109-112 with HR running 120's.  States he increased his Lopressor back up to his previous dose of 50mg  twice a day.  Today BP been doing better at 148/80, 130/83  & HR in the 70's.  Advised him to decrease back to the prescribed dose of the Lopressor 25 twice a day & keep BP log and HR x 2 weeks.  Goal would be 130's.  Continue to avoid alcohol & stay hydrated well.  He verbalized understanding.

## 2023-10-16 ENCOUNTER — Telehealth: Payer: Self-pay | Admitting: Internal Medicine

## 2023-10-16 NOTE — Telephone Encounter (Signed)
Pt c/o BP issue: STAT if pt c/o blurred vision, one-sided weakness or slurred speech  1. What are your last 5 BP readings? 95/56 about 40 mins ago  2. Are you having any other symptoms (ex. Dizziness, headache, blurred vision, passed out)? A slight headache  3. What is your BP issue? Pt is concerned about his BP being this low and is wanting to discuss with a nurse.

## 2023-10-16 NOTE — Telephone Encounter (Signed)
Received call from patient regarding blood pressure lower than usual with no presyncope. Reports mild headache. Would like to know whether he should take night time Metoprolol. BP one time 95/56 but otherwise with the systolic 110's.  Reassured patient that his having normal blood pressure readings.

## 2023-10-17 DIAGNOSIS — E1165 Type 2 diabetes mellitus with hyperglycemia: Secondary | ICD-10-CM | POA: Diagnosis not present

## 2023-10-17 DIAGNOSIS — F331 Major depressive disorder, recurrent, moderate: Secondary | ICD-10-CM | POA: Diagnosis not present

## 2023-10-17 DIAGNOSIS — K760 Fatty (change of) liver, not elsewhere classified: Secondary | ICD-10-CM | POA: Diagnosis not present

## 2023-10-17 DIAGNOSIS — M25512 Pain in left shoulder: Secondary | ICD-10-CM | POA: Diagnosis not present

## 2023-10-17 DIAGNOSIS — I10 Essential (primary) hypertension: Secondary | ICD-10-CM | POA: Diagnosis not present

## 2023-10-17 DIAGNOSIS — L219 Seborrheic dermatitis, unspecified: Secondary | ICD-10-CM | POA: Diagnosis not present

## 2023-10-17 DIAGNOSIS — E782 Mixed hyperlipidemia: Secondary | ICD-10-CM | POA: Diagnosis not present

## 2023-10-17 DIAGNOSIS — F411 Generalized anxiety disorder: Secondary | ICD-10-CM | POA: Diagnosis not present

## 2023-10-17 DIAGNOSIS — E7849 Other hyperlipidemia: Secondary | ICD-10-CM | POA: Diagnosis not present

## 2023-10-17 DIAGNOSIS — M1711 Unilateral primary osteoarthritis, right knee: Secondary | ICD-10-CM | POA: Diagnosis not present

## 2023-10-17 DIAGNOSIS — M1712 Unilateral primary osteoarthritis, left knee: Secondary | ICD-10-CM | POA: Diagnosis not present

## 2023-10-17 DIAGNOSIS — Z79891 Long term (current) use of opiate analgesic: Secondary | ICD-10-CM | POA: Diagnosis not present

## 2023-10-17 DIAGNOSIS — N486 Induration penis plastica: Secondary | ICD-10-CM | POA: Diagnosis not present

## 2023-10-17 DIAGNOSIS — J301 Allergic rhinitis due to pollen: Secondary | ICD-10-CM | POA: Diagnosis not present

## 2023-10-17 NOTE — Telephone Encounter (Signed)
Left a message for patient to call office back regarding blood pressure issues.

## 2023-10-18 NOTE — Telephone Encounter (Signed)
Patient's fiance is returning CMA's call. Patient states it is best to call after 1:00 pm instead of in mornings.  Please advise.

## 2023-10-18 NOTE — Telephone Encounter (Signed)
Left a message for patient to call office back regarding blood pressure issues.

## 2023-10-23 DIAGNOSIS — Z20828 Contact with and (suspected) exposure to other viral communicable diseases: Secondary | ICD-10-CM | POA: Diagnosis not present

## 2023-10-23 DIAGNOSIS — R059 Cough, unspecified: Secondary | ICD-10-CM | POA: Diagnosis not present

## 2023-10-23 DIAGNOSIS — R062 Wheezing: Secondary | ICD-10-CM | POA: Diagnosis not present

## 2023-10-23 DIAGNOSIS — R030 Elevated blood-pressure reading, without diagnosis of hypertension: Secondary | ICD-10-CM | POA: Diagnosis not present

## 2023-10-23 DIAGNOSIS — J984 Other disorders of lung: Secondary | ICD-10-CM | POA: Diagnosis not present

## 2023-10-23 NOTE — Telephone Encounter (Signed)
We have tried to contact patient several times with no answer

## 2023-11-06 ENCOUNTER — Ambulatory Visit: Payer: Medicare HMO | Attending: Nurse Practitioner | Admitting: Nurse Practitioner

## 2023-11-06 ENCOUNTER — Encounter: Payer: Self-pay | Admitting: Nurse Practitioner

## 2023-11-06 ENCOUNTER — Other Ambulatory Visit: Payer: Self-pay | Admitting: *Deleted

## 2023-11-06 VITALS — BP 130/80 | HR 64 | Ht 72.0 in | Wt 190.6 lb

## 2023-11-06 DIAGNOSIS — Z951 Presence of aortocoronary bypass graft: Secondary | ICD-10-CM

## 2023-11-06 DIAGNOSIS — E785 Hyperlipidemia, unspecified: Secondary | ICD-10-CM

## 2023-11-06 DIAGNOSIS — I5022 Chronic systolic (congestive) heart failure: Secondary | ICD-10-CM | POA: Diagnosis not present

## 2023-11-06 DIAGNOSIS — I4892 Unspecified atrial flutter: Secondary | ICD-10-CM

## 2023-11-06 DIAGNOSIS — I2510 Atherosclerotic heart disease of native coronary artery without angina pectoris: Secondary | ICD-10-CM

## 2023-11-06 DIAGNOSIS — Z952 Presence of prosthetic heart valve: Secondary | ICD-10-CM

## 2023-11-06 DIAGNOSIS — F1011 Alcohol abuse, in remission: Secondary | ICD-10-CM

## 2023-11-06 DIAGNOSIS — I10 Essential (primary) hypertension: Secondary | ICD-10-CM | POA: Diagnosis not present

## 2023-11-06 DIAGNOSIS — Z720 Tobacco use: Secondary | ICD-10-CM

## 2023-11-06 MED ORDER — APIXABAN 5 MG PO TABS: 5.0000 mg | ORAL_TABLET | Freq: Two times a day (BID) | ORAL | 1 refills | Status: AC

## 2023-11-06 MED ORDER — ATORVASTATIN CALCIUM 40 MG PO TABS: 40.0000 mg | ORAL_TABLET | Freq: Every day | ORAL | 1 refills | Status: DC

## 2023-11-06 MED ORDER — AMIODARONE HCL 200 MG PO TABS: 200.0000 mg | ORAL_TABLET | Freq: Every day | ORAL | 1 refills | Status: DC

## 2023-11-06 MED ORDER — METOPROLOL TARTRATE 25 MG PO TABS: 25.0000 mg | ORAL_TABLET | Freq: Two times a day (BID) | ORAL | 1 refills | Status: AC

## 2023-11-06 MED ORDER — SPIRONOLACTONE 25 MG PO TABS: 25.0000 mg | ORAL_TABLET | Freq: Every day | ORAL | 1 refills | Status: DC

## 2023-11-06 NOTE — Progress Notes (Unsigned)
Cardiology Office Note:  .   Date:  09/05/2023 ID:  Vira Browns, DOB 1956-09-16, MRN [id number] PCP: Richardean Chimera, MD  [facility] Providers Cardiologist:  Marjo Bicker, MD    History of Present Illness: .   Raymond Roberson is a 67 y.o. male with a PMH of CAD, hx of NSTEMI and s/p CABG x1 (LIMA-LAD), HFmrEF/ICM, severe aortic valve stenosis due to bicuspid aortic valve, s/p SAVR, HLD, HTN, who presents today for hospital follow-up.   Admitted to [facility] in January 2024 with NSTEMI.  Left heart cath revealed proximal LAD stenosis and CTO RCA.  TTE revealed EF 30 to 35% with severe aortic valve stenosis, underlying bicuspid aortic valve and mild AI.  Managed for cardiogenic shock on milrinone.  Patient was stabilized and left AMA.  TTE post CABG revealed EF improved but mildly reduced to 40 to 45%.  Beta-blocker has been deferred in outpatient clinic due to bradycardia, heart rate we found a 45 bpm.  Seen by Dr. Jenene Slicker on April 10, 2023.  Patient denied and CP, admitted to left arm tingling for the last 1 to 2 months.  Was on aspirin and not on Plavix.  Could not tolerate Jardiance-reported having symptoms of memory changes and confusion while on medication. HR 74 bpm, started on Lopressor 12.5 mg BID and restarted on Plavix. TTE ordered to be completed in 3 months.   Hospital admission 06/2023 for alcohol withdrawal.  Stopped drinking and developed tremulousness and diaphoresis, reported drinking 12 beers per day and developed 2 days of dizziness and shortness of breath, eventually presented to [facility].  In the ED he was found to be in A-fib/A-flutter, treated with amiodarone.  Also became increasingly agitated, required Precedex and intubation and was transferred to [facility] and was treated in ICU.  Flat troponins, ethanol less than 10. ABG - 7/52/30/158/25. Hospital course noted by fever, was started on IV antibiotics.  Respiratory cultures grew out strep agalactiaea.   Unasyn changed to Rocephin.  Blood cultures negative.  Did report shoulder pain, x-ray shoulder noted mild osteoarthritis.  Alcohol withdrawal resolved.  Amiodarone tapered down to 200 mg daily.  Heart rate was well-controlled. Limited Echo revealed EF 35 to 40%.   Today he presents for overdue hospital follow-up with his fiance.  Says he does not feel good and is stressed.  He says his new medications are not making him feel good, have affected his sex drive, which is his chief concern.  Says he has been tempted to come off his medications, however his fiance has told him he needs to continue to take his medications. Denies any chest pain, shortness of breath, palpitations, syncope, presyncope, dizziness, orthopnea, PND, swelling or significant weight changes, acute bleeding, or claudication. Says he was feeling good last time he was in the office, and now not feeling good. Admits to nausea and muscle aches/pain. Says SBP's run soft, fianc says sometimes SBP has been running low around 80's-90's at times. Checks BP multiple times per day. Drinks sodas.   SH: Occasionally drinks alcohol, denies tobacco use, but dips tobacco.   ROS: Negative  Studies Reviewed: Marland Kitchen    EKG:     Limited Echo 06/2023:  1. Inferior sepatal and apical hypokinesis . Left ventricular ejection  fraction, by estimation, is 35 to 40%. The left ventricle has moderately  decreased function. The left ventricle demonstrates regional wall motion  abnormalities (see scoring  diagram/findings for description). The left ventricular internal  cavity  size was mildly dilated. Left ventricular diastolic parameters are  indeterminate.   2. Right ventricular systolic function is normal. The right ventricular  size is normal.   3. Left atrial size was moderately dilated.   4. The mitral valve is abnormal. Mild mitral valve regurgitation. No  evidence of mitral stenosis.   5. No PVL normal gradients for valve Peak gradient only 10.8  mmHg mean  7.4 mmHg . The aortic valve has been repaired/replaced. Aortic valve  regurgitation is not visualized. No aortic stenosis is present. There is a  27 mm Edwards valve present in the  aortic position. Procedure Date: 01/23/23.   6. The inferior vena cava is dilated in size with >50% respiratory  variability, suggesting right atrial pressure of 8 mmHg.  Echo complete 02/2023:  1. Left ventricular ejection fraction, by estimation, is 40 to 45%. The  left ventricle has mildly decreased function. The left ventricle  demonstrates regional wall motion abnormalities (see scoring  diagram/findings for description). There is mild left  ventricular hypertrophy. Left ventricular diastolic parameters are  consistent with Grade I diastolic dysfunction (impaired relaxation).   2. Right ventricular systolic function is normal. The right ventricular  size is normal.   3. The mitral valve is normal in structure. Trivial mitral valve  regurgitation. No evidence of mitral stenosis.   4. The aortic valve has been repaired/replaced. Aortic valve  regurgitation is not visualized. No aortic stenosis is present. There is a  bioprosthetic valve present in the aortic position.   5. The inferior vena cava is normal in size with greater than 50%  respiratory variability, suggesting right atrial pressure of 3 mmHg.  Vascular ultrasound pre-CABG 12/2022:  Summary:  Right Carotid: Velocities in the right ICA are consistent with a 1-39%  stenosis.   Left Carotid: Velocities in the left ICA are consistent with a 1-39%  stenosis.  Vertebrals: Bilateral vertebral arteries demonstrate antegrade flow.  Subclavians: Normal flow hemodynamics were seen in bilateral subclavian arteries.   Right ABI: Resting right ankle-brachial index is within normal range. The right toe-brachial index is normal.  Left ABI: Resting left ankle-brachial index is within normal range. The left toe-brachial index is normal.  TEE  12/2022:  Complications: No known complications during this procedure.  POST-OP IMPRESSIONS  _ Aortic Valve: No stenosis present. A bioprosthetic valve was placed,  leaflets  are freely mobile and thin. Manufactured by; inspiris Size; 27mm. There is  no  regurgitation. The gradient recorded across the prosthetic valve is within  the  expected range. No perivalvular leak noted.   PRE-OP FINDINGS   Left Ventricle: The left ventricle has moderate-severely reduced systolic  function, with an ejection fraction of 30-35%. The cavity size was mildly  dilated. There is no increase in left ventricular wall thickness. Left  ventrical global hypokinesis  without regional wall motion abnormalities.    Right Ventricle: The right ventricle has normal systolic function. The  cavity was normal. There is no increase in right ventricular wall  thickness.   Left Atrium: Left atrial size was dilated. No left atrial/left atrial  appendage thrombus was detected.   Right Atrium: Right atrial size was normal in size.   Interatrial Septum: No atrial level shunt detected by color flow Doppler.   Pericardium: There is no evidence of pericardial effusion.   Mitral Valve: The mitral valve is normal in structure. Mitral valve  regurgitation is mild by color flow Doppler. The MR jet is  centrally-directed. There  is No evidence of mitral stenosis.   Tricuspid Valve: The tricuspid valve was normal in structure. Tricuspid  valve regurgitation is trivial by color flow Doppler.   Aortic Valve: The aortic valve is bicuspid Aortic valve regurgitation is  mild by color flow Doppler. The jet is anteriorly-directed. There is  severe stenosis of the aortic valve. There is moderate aortic annular  calcification noted.  Heavily calcified native aortic valve.   Pulmonic Valve: The pulmonic valve was normal in structure.  Pulmonic valve regurgitation is not visualized by color flow Doppler.    Aorta: The aortic  root, ascending aorta and aortic arch are normal in size  and structure.     Right heart cath 12/2022:  HEMODYNAMICS:    1: Right atrial pressure-9/8 2: Right ventricular pressure-40/0 3: Pulmonary artery pressure-38/10, mean 24 4: Pulmonary wedge pressure-A-wave 26, V wave 26, mean 17 5: Cardiac output-5.15 L/min with an index of 2.53 L/min/m     IMPRESSION: Mr. Amerman has severe aortic stenosis (bicuspid aortic valve) with moderately severe LV dysfunction and two-vessel disease.  He has a large dominant RCA that is occluded at the ostium with grade 3 left-to-right collaterals and a high-grade calcified proximal LAD stenosis.  The case has been reviewed with Dr. Royann Shivers, the patient's attending cardiologist, and Dr. Excell Seltzer, structural interventionalists who feel best option is CABG/SAVR.  The radial sheath was removed and a TR band was placed on the right wrist to achieve patent hemostasis.  Patient left lab in stable condition.  Patient will return to his room and TCTS will be obtained.  Echo 12/2022:  1. Calcified aortic valve with inderminant number of cusps; probable  severe AS with mean gradient as high as 37 mmHg; mild AI.   2. Left ventricular ejection fraction, by estimation, is 30 to 35%. The  left ventricle has moderate to severely decreased function. The left  ventricle demonstrates global hypokinesis. The left ventricular internal  cavity size was moderately dilated.  Left ventricular diastolic parameters are consistent with Grade III  diastolic dysfunction (restrictive).   3. Right ventricular systolic function is mildly reduced. The right  ventricular size is normal.   4. The mitral valve is degenerative. Mild mitral valve regurgitation. No  evidence of mitral stenosis.   5. The aortic valve is calcified. Aortic valve regurgitation is mild.  Severe aortic valve stenosis. Aortic regurgitation PHT measures 220 msec.  Aortic valve area, by VTI measures 1.43 cm. Aortic  valve mean gradient  measures 33.7 mmHg. Aortic valve Vmax   measures 3.63 m/s.   6. The inferior vena cava is dilated in size with >50% respiratory  variability, suggesting right atrial pressure of 8 mmHg.  Risk Assessment/Calculations:    CHA2DS2-VASc Score = 3  This indicates a 3.2% annual risk of stroke. The patient's score is based upon: CHF History: 1 HTN History: 1 Diabetes History: 0 Stroke History: 0 Vascular Disease History: 1 Age Score: 0 Gender Score: 0       Physical Exam:   VS:  Ht 6' (1.829 m)   Wt 190 lb 9.6 oz (86.5 kg)   BMI 25.85 kg/m    Wt Readings from Last 3 Encounters:  11/06/23 190 lb 9.6 oz (86.5 kg)  09/05/23 186 lb 12.8 oz (84.7 kg)  07/07/23 183 lb 3.2 oz (83.1 kg)    GEN: Well nourished, well developed in no acute distress NECK: No JVD; No carotid bruits CARDIAC: S1/S2, irregularly irregular, no murmurs, rubs, gallops RESPIRATORY:  Clear  to auscultation without rales, wheezing or rhonchi  ABDOMEN: Soft, non-tender, non-distended EXTREMITIES:  No edema; No deformity  PSYCH: Calm, cooperative, appears nervous, fidgety   ASSESSMENT AND PLAN: .    Chronic systolic CHF, HFrEF Stage C, NYHA class I symptoms. Limited Echo 06/2023 revealed drop in EF to 35-40% from previous 40-45%. Euvolemic and well compensated on exam. Drop in EF felt likely d/t hx of ETOH abuse. Hx of soft BP's at home and drop in BP's per fianc's report.  Will reduce Lopressor to 25 mg twice daily.  Continue spironolactone. Hesitant to start ARNI/ACEI/ARB d/t BP trends - see #5 below. Low sodium diet, fluid restriction <2L, and daily weights encouraged. Educated to contact our office for weight gain of 2 lbs overnight or 5 lbs in one week. Hesitant about starting SGLT2i at this time d/t his current symptoms.    A-flutter Recent hospital admission and was found to be in A-fib/a flutter in setting of alcohol withdrawal.  EKG today shows he remains to be in A-flutter. Denies any  tachycardia or palpitations, chief concern is low sex drive - see #7. Continue amiodarone, reducing Lopressor as mentioned above to help improve BP. Continue Eliquis 5 mg BID due to CHA2DS2-VASc score is 3, denies any bleeding issues.  He is on appropriate dosage for this medication.  We discussed avoiding triggers to A-fib/A-flutter. At next visit, consider DCCV or TEE/DCCV if continued A-fib/A-flutter.  Discussed the importance of avoiding alcohol due to his arrhythmia.  He verbalized understanding. Will obtain thyroid panel, LFT in 6 weeks.   CAD, s/p CABG Underwent one-vessel CABG in January 2024 (LIMA-LAD), has known CTO RCA.  Troponins were flat during most recent hospital admission.  Continue aspirin, and will reduce Lipitor to 40 mg daily to improve myalgias - see below.  Plavix was stopped previously due to Eliquis being started. Heart healthy diet and regular cardiovascular exercise encouraged.  Care and ED precautions discussed.  Severe aortic valve stenosis, s/p SAVR He is status post SAVR in 12/2022 with 27 mm Edwards Inspiris Resilia. Recent limited Echo revealed stable function. Will continue to monitor.   HTN Blood pressure on arrival 160/60, repeat BP 134/82.  He does admit to recent stresses he does not feel good, police is coming from his medications.  Fianc also admits to soft BPs at times and SBP occasionally around 80-90's. Reducing Lopressor to 25 mg BID.  Continue spironolactone. Given BP log and discussed to monitor BP at home at least 2 hours after medications and sitting for 5-10 minutes.  Encouraged him to stay well hydrated and drink water around < 2 L per day (see #1). He will let us know his BP readings in 2 weeks. Heart healthy diet and regular cardiovascular exercise encouraged.   HLD, myalgias, nausea Patient does admit to recent muscle sore/aches and nausea.  Etiology multifactorial and could be partly d/t previous hospital course.  Will reduce atorvastatin to 40 mg  daily to see if this makes a difference.  He will let us know in 1 to 2 weeks how his symptoms are doing. Continue to follow with PCP. Will obtain FLP in 6 weeks.   Hx of ETOH abuse, chewing tobacco use Discussed importance of abstaining from alcohol and chewing tobacco. He verbalized understanding.   Low libido His new cardiac medications including amiodarone and metoprolol may be most likely contributing to this.  Reducing metoprolol to 25 mg twice daily as mentioned above.  Continue amiodarone 200 mg at this time,  as recurrence of arrhythmia may be likely due to his past history of EtOH abuse.  Viagra has been previously prescribed by another provider.  Continue to follow with PCP.  Request labs with Dr. Reuel Boom. Switch Eliquis to Coumadin.  Provide samples cause he will need it.  Refill all cardiac medications - 90 days.  2 months.     Dispo: Follow-up with me or APP in 8 weeks or sooner if anything changes.  Signed, Sharlene Dory, NP

## 2023-11-06 NOTE — Patient Instructions (Addendum)
Medication Instructions:  Your physician recommends that you continue on your current medications as directed. Please refer to the Current Medication list given to you today.  Labwork: None  Testing/Procedures: None  Follow-Up: Your physician recommends that you schedule a follow-up appointment in: 2 Months   Any Other Special Instructions Will Be Listed Below (If Applicable).  If you need a refill on your cardiac medications before your next appointment, please call your pharmacy.

## 2023-11-07 ENCOUNTER — Encounter: Payer: Self-pay | Admitting: *Deleted

## 2023-11-07 NOTE — Patient Instructions (Signed)
Talked with patient at length about changing from Eliquis to warfarin due to cost.  He has only been taking Eliquis once daily.  After explaining warfarin, how it works and need for frequent INR checks at first he states he is not interested in making the change.  States warfarin will be to stressful for him.  Requested I send in a new Eliquis Rx for a 90 day day supply to see if it's cheaper that 30 day.  That was done.  Told pt if he still can't afford it to call me back.  He agreed.

## 2023-11-08 DIAGNOSIS — J441 Chronic obstructive pulmonary disease with (acute) exacerbation: Secondary | ICD-10-CM | POA: Diagnosis not present

## 2023-11-08 DIAGNOSIS — Z6825 Body mass index (BMI) 25.0-25.9, adult: Secondary | ICD-10-CM | POA: Diagnosis not present

## 2023-11-08 DIAGNOSIS — R030 Elevated blood-pressure reading, without diagnosis of hypertension: Secondary | ICD-10-CM | POA: Diagnosis not present

## 2023-12-13 ENCOUNTER — Encounter: Payer: Self-pay | Admitting: *Deleted

## 2023-12-13 ENCOUNTER — Ambulatory Visit: Payer: Medicare HMO

## 2023-12-21 ENCOUNTER — Encounter: Payer: Self-pay | Admitting: Nurse Practitioner

## 2024-01-08 ENCOUNTER — Ambulatory Visit: Payer: Medicare HMO | Attending: Nurse Practitioner | Admitting: Nurse Practitioner

## 2024-01-24 DIAGNOSIS — R5383 Other fatigue: Secondary | ICD-10-CM | POA: Diagnosis not present

## 2024-01-24 DIAGNOSIS — E782 Mixed hyperlipidemia: Secondary | ICD-10-CM | POA: Diagnosis not present

## 2024-01-24 DIAGNOSIS — K219 Gastro-esophageal reflux disease without esophagitis: Secondary | ICD-10-CM | POA: Diagnosis not present

## 2024-01-24 DIAGNOSIS — E1165 Type 2 diabetes mellitus with hyperglycemia: Secondary | ICD-10-CM | POA: Diagnosis not present

## 2024-01-24 DIAGNOSIS — E7849 Other hyperlipidemia: Secondary | ICD-10-CM | POA: Diagnosis not present

## 2024-01-31 DIAGNOSIS — E7849 Other hyperlipidemia: Secondary | ICD-10-CM | POA: Diagnosis not present

## 2024-01-31 DIAGNOSIS — Z20828 Contact with and (suspected) exposure to other viral communicable diseases: Secondary | ICD-10-CM | POA: Diagnosis not present

## 2024-01-31 DIAGNOSIS — E1165 Type 2 diabetes mellitus with hyperglycemia: Secondary | ICD-10-CM | POA: Diagnosis not present

## 2024-01-31 DIAGNOSIS — F331 Major depressive disorder, recurrent, moderate: Secondary | ICD-10-CM | POA: Diagnosis not present

## 2024-01-31 DIAGNOSIS — G43909 Migraine, unspecified, not intractable, without status migrainosus: Secondary | ICD-10-CM | POA: Diagnosis not present

## 2024-01-31 DIAGNOSIS — Z2089 Contact with and (suspected) exposure to other communicable diseases: Secondary | ICD-10-CM | POA: Diagnosis not present

## 2024-01-31 DIAGNOSIS — E782 Mixed hyperlipidemia: Secondary | ICD-10-CM | POA: Diagnosis not present

## 2024-01-31 DIAGNOSIS — Z6826 Body mass index (BMI) 26.0-26.9, adult: Secondary | ICD-10-CM | POA: Diagnosis not present

## 2024-03-24 DIAGNOSIS — Z6826 Body mass index (BMI) 26.0-26.9, adult: Secondary | ICD-10-CM | POA: Diagnosis not present

## 2024-03-24 DIAGNOSIS — M79644 Pain in right finger(s): Secondary | ICD-10-CM | POA: Diagnosis not present

## 2024-03-24 DIAGNOSIS — L03011 Cellulitis of right finger: Secondary | ICD-10-CM | POA: Diagnosis not present

## 2024-03-26 DIAGNOSIS — M79644 Pain in right finger(s): Secondary | ICD-10-CM | POA: Diagnosis not present

## 2024-03-26 DIAGNOSIS — Z6826 Body mass index (BMI) 26.0-26.9, adult: Secondary | ICD-10-CM | POA: Diagnosis not present

## 2024-03-26 DIAGNOSIS — M7989 Other specified soft tissue disorders: Secondary | ICD-10-CM | POA: Diagnosis not present

## 2024-03-26 DIAGNOSIS — M25562 Pain in left knee: Secondary | ICD-10-CM | POA: Diagnosis not present

## 2024-05-16 DIAGNOSIS — R531 Weakness: Secondary | ICD-10-CM | POA: Diagnosis not present

## 2024-05-16 DIAGNOSIS — Z882 Allergy status to sulfonamides status: Secondary | ICD-10-CM | POA: Diagnosis not present

## 2024-05-16 DIAGNOSIS — K219 Gastro-esophageal reflux disease without esophagitis: Secondary | ICD-10-CM | POA: Diagnosis not present

## 2024-05-16 DIAGNOSIS — Z91141 Patient's other noncompliance with medication regimen due to financial hardship: Secondary | ICD-10-CM | POA: Diagnosis not present

## 2024-05-16 DIAGNOSIS — F17220 Nicotine dependence, chewing tobacco, uncomplicated: Secondary | ICD-10-CM | POA: Diagnosis not present

## 2024-05-16 DIAGNOSIS — F32A Depression, unspecified: Secondary | ICD-10-CM | POA: Diagnosis not present

## 2024-05-16 DIAGNOSIS — I4892 Unspecified atrial flutter: Secondary | ICD-10-CM | POA: Diagnosis not present

## 2024-05-16 DIAGNOSIS — F419 Anxiety disorder, unspecified: Secondary | ICD-10-CM | POA: Diagnosis not present

## 2024-05-16 DIAGNOSIS — F1120 Opioid dependence, uncomplicated: Secondary | ICD-10-CM | POA: Diagnosis not present

## 2024-05-16 DIAGNOSIS — F1010 Alcohol abuse, uncomplicated: Secondary | ICD-10-CM | POA: Diagnosis not present

## 2024-05-16 DIAGNOSIS — E119 Type 2 diabetes mellitus without complications: Secondary | ICD-10-CM | POA: Diagnosis not present

## 2024-06-12 DIAGNOSIS — Z1329 Encounter for screening for other suspected endocrine disorder: Secondary | ICD-10-CM | POA: Diagnosis not present

## 2024-06-12 DIAGNOSIS — Z79891 Long term (current) use of opiate analgesic: Secondary | ICD-10-CM | POA: Diagnosis not present

## 2024-06-12 DIAGNOSIS — Z1331 Encounter for screening for depression: Secondary | ICD-10-CM | POA: Diagnosis not present

## 2024-06-12 DIAGNOSIS — F331 Major depressive disorder, recurrent, moderate: Secondary | ICD-10-CM | POA: Diagnosis not present

## 2024-06-12 DIAGNOSIS — E7849 Other hyperlipidemia: Secondary | ICD-10-CM | POA: Diagnosis not present

## 2024-06-12 DIAGNOSIS — G43909 Migraine, unspecified, not intractable, without status migrainosus: Secondary | ICD-10-CM | POA: Diagnosis not present

## 2024-06-12 DIAGNOSIS — Z1389 Encounter for screening for other disorder: Secondary | ICD-10-CM | POA: Diagnosis not present

## 2024-06-12 DIAGNOSIS — Z0001 Encounter for general adult medical examination with abnormal findings: Secondary | ICD-10-CM | POA: Diagnosis not present

## 2024-06-12 DIAGNOSIS — Z125 Encounter for screening for malignant neoplasm of prostate: Secondary | ICD-10-CM | POA: Diagnosis not present

## 2024-06-12 DIAGNOSIS — E1165 Type 2 diabetes mellitus with hyperglycemia: Secondary | ICD-10-CM | POA: Diagnosis not present

## 2024-07-05 ENCOUNTER — Other Ambulatory Visit: Payer: Self-pay | Admitting: Nurse Practitioner

## 2024-08-06 ENCOUNTER — Other Ambulatory Visit: Payer: Self-pay

## 2024-08-06 ENCOUNTER — Telehealth: Payer: Self-pay | Admitting: Nurse Practitioner

## 2024-08-06 ENCOUNTER — Other Ambulatory Visit: Payer: Self-pay | Admitting: Nurse Practitioner

## 2024-08-06 MED ORDER — ATORVASTATIN CALCIUM 40 MG PO TABS: 40.0000 mg | ORAL_TABLET | Freq: Every day | ORAL | 0 refills | Status: DC

## 2024-08-06 NOTE — Telephone Encounter (Signed)
*  STAT* If patient is at the pharmacy, call can be transferred to refill team.   1. Which medications need to be refilled? (please list name of each medication and dose if known)   atorvastatin  (LIPITOR ) 40 MG tablet   NEW PHARMACY    4. Which pharmacy/location (including street and city if local pharmacy) is medication to be sent to?  [facility] - [place], [place] - [address] Phone: [phone]  Fax: [phone]       5. Do they need a 30 day or 90 day supply? 90   Scheduled 10/10

## 2024-09-02 ENCOUNTER — Other Ambulatory Visit: Payer: Self-pay | Admitting: Nurse Practitioner

## 2024-09-02 DIAGNOSIS — G8929 Other chronic pain: Secondary | ICD-10-CM | POA: Diagnosis not present

## 2024-09-02 DIAGNOSIS — Z79899 Other long term (current) drug therapy: Secondary | ICD-10-CM | POA: Diagnosis not present

## 2024-09-02 DIAGNOSIS — M129 Arthropathy, unspecified: Secondary | ICD-10-CM | POA: Diagnosis not present

## 2024-09-02 DIAGNOSIS — M546 Pain in thoracic spine: Secondary | ICD-10-CM | POA: Diagnosis not present

## 2024-09-02 DIAGNOSIS — R0602 Shortness of breath: Secondary | ICD-10-CM | POA: Diagnosis not present

## 2024-09-02 DIAGNOSIS — E7800 Pure hypercholesterolemia, unspecified: Secondary | ICD-10-CM | POA: Diagnosis not present

## 2024-09-02 DIAGNOSIS — M542 Cervicalgia: Secondary | ICD-10-CM | POA: Diagnosis not present

## 2024-09-02 DIAGNOSIS — M545 Low back pain, unspecified: Secondary | ICD-10-CM | POA: Diagnosis not present

## 2024-09-03 ENCOUNTER — Telehealth: Payer: Self-pay | Admitting: Internal Medicine

## 2024-09-03 MED ORDER — SPIRONOLACTONE 25 MG PO TABS: 25.0000 mg | ORAL_TABLET | Freq: Every day | ORAL | 0 refills | Status: DC

## 2024-09-03 MED ORDER — AMIODARONE HCL 200 MG PO TABS: 200.0000 mg | ORAL_TABLET | Freq: Every day | ORAL | 0 refills | Status: DC

## 2024-09-03 NOTE — Telephone Encounter (Signed)
 Pt's medications were sent to pt's pharmacy as requested. Confirmation received.

## 2024-09-03 NOTE — Telephone Encounter (Signed)
*  STAT* If patient is at the pharmacy, call can be transferred to refill team.   1. Which medications need to be refilled? (please list name of each medication and dose if known) amiodarone  (PACERONE ) 200 MG tablet   spironolactone  (ALDACTONE ) 25 MG tablet     2. Which pharmacy/location (including street and city if local pharmacy) is medication to be sent to? [facility] - [place], [place] - [address]   3. Do they need a 30 day or 90 day supply? 90  Patient is out of medication

## 2024-09-10 DIAGNOSIS — E871 Hypo-osmolality and hyponatremia: Secondary | ICD-10-CM | POA: Diagnosis not present

## 2024-09-10 DIAGNOSIS — R748 Abnormal levels of other serum enzymes: Secondary | ICD-10-CM | POA: Diagnosis not present

## 2024-09-10 DIAGNOSIS — I2510 Atherosclerotic heart disease of native coronary artery without angina pectoris: Secondary | ICD-10-CM | POA: Diagnosis not present

## 2024-09-10 DIAGNOSIS — F331 Major depressive disorder, recurrent, moderate: Secondary | ICD-10-CM | POA: Diagnosis not present

## 2024-09-10 DIAGNOSIS — R1111 Vomiting without nausea: Secondary | ICD-10-CM | POA: Diagnosis not present

## 2024-09-10 DIAGNOSIS — F1120 Opioid dependence, uncomplicated: Secondary | ICD-10-CM | POA: Diagnosis not present

## 2024-09-10 DIAGNOSIS — F17220 Nicotine dependence, chewing tobacco, uncomplicated: Secondary | ICD-10-CM | POA: Diagnosis not present

## 2024-09-10 DIAGNOSIS — F1010 Alcohol abuse, uncomplicated: Secondary | ICD-10-CM | POA: Diagnosis not present

## 2024-09-10 DIAGNOSIS — E11649 Type 2 diabetes mellitus with hypoglycemia without coma: Secondary | ICD-10-CM | POA: Diagnosis not present

## 2024-09-10 DIAGNOSIS — K760 Fatty (change of) liver, not elsewhere classified: Secondary | ICD-10-CM | POA: Diagnosis not present

## 2024-09-10 DIAGNOSIS — Z20822 Contact with and (suspected) exposure to covid-19: Secondary | ICD-10-CM | POA: Diagnosis not present

## 2024-09-10 DIAGNOSIS — K2100 Gastro-esophageal reflux disease with esophagitis, without bleeding: Secondary | ICD-10-CM | POA: Diagnosis not present

## 2024-09-10 DIAGNOSIS — R531 Weakness: Secondary | ICD-10-CM | POA: Diagnosis not present

## 2024-09-10 DIAGNOSIS — N289 Disorder of kidney and ureter, unspecified: Secondary | ICD-10-CM | POA: Diagnosis not present

## 2024-09-10 DIAGNOSIS — I10 Essential (primary) hypertension: Secondary | ICD-10-CM | POA: Diagnosis not present

## 2024-09-10 DIAGNOSIS — I4891 Unspecified atrial fibrillation: Secondary | ICD-10-CM | POA: Diagnosis not present

## 2024-09-10 DIAGNOSIS — R110 Nausea: Secondary | ICD-10-CM | POA: Diagnosis not present

## 2024-09-10 DIAGNOSIS — K7010 Alcoholic hepatitis without ascites: Secondary | ICD-10-CM | POA: Diagnosis not present

## 2024-09-11 DIAGNOSIS — E871 Hypo-osmolality and hyponatremia: Secondary | ICD-10-CM | POA: Diagnosis not present

## 2024-09-12 DIAGNOSIS — E871 Hypo-osmolality and hyponatremia: Secondary | ICD-10-CM | POA: Diagnosis not present

## 2024-09-13 DIAGNOSIS — E871 Hypo-osmolality and hyponatremia: Secondary | ICD-10-CM | POA: Diagnosis not present

## 2024-09-14 DIAGNOSIS — K7010 Alcoholic hepatitis without ascites: Secondary | ICD-10-CM | POA: Diagnosis not present

## 2024-09-14 DIAGNOSIS — E871 Hypo-osmolality and hyponatremia: Secondary | ICD-10-CM | POA: Diagnosis not present

## 2024-09-14 NOTE — Care Plan (Signed)
 Shift Summary LORazepam  and ondansetron  were administered PRN near the end of the shift for mild symptoms and nausea. Sodium chloride  infusion was stopped in the morning, and oral intake was maintained with full meal consumption. Discharge instructions were reviewed and provided twice during the shift. Peripheral IV site remained clean, dry, and intact with no intervention needed. Overall, vital signs and SOFA score remained stable, and discharge preparation was completed.  Optimal Pain Control and Function: No pain was reported throughout the shift, and no pain interventions were required.  Maintenance of Behavioral Health Symptom Control: LORazepam  was administered PRN for mild symptoms as indicated by CIWA-Ar Score, with no further behavioral health interventions documented.  Electrolyte Balance: Sodium chloride  infusion was stopped during the shift, and vital signs remained within normal limits; oral intake was maintained and meals were consumed fully.  Alcohol  Withdrawal Symptom Control: LORazepam  was administered PRN for mild withdrawal symptoms, and CIWA-Ar Score remained in the mild range during the shift.

## 2024-09-14 NOTE — Discharge Summary (Signed)
 Discharge Summary         Admit date:    09/10/2024 Discharge date:   09/14/2024 Length of stay:    LOS: 4 days     Discharge Attending Physician: Jerel KANDICE Sieving, MD  Discharge to:    To Home with Home Health Condition at Discharge:  fair Code Status:    Full Code  Patient Care Team: Sieving Jerel Area, MD as PCP - General (Family Medicine)  Consults       none  Discharge Diagnoses  Principal Problem:   Acute hyponatremia Active Problems:   Alcoholic hepatitis without ascites    (CMS-HCC)   Alcohol  dependence with acute alcoholic intoxication with complication    (CMS-HCC)   Gastroesophageal reflux disease with esophagitis without hemorrhage   Idiopathic hypertension   Major depressive disorder, recurrent episode, moderate (CMS-HCC)   Atherosclerotic heart disease of native coronary artery with angina pectoris   Hospital Course  This 68 year old white male with coronary artery disease status post CABG hyponatremia alcoholic hepatitis alcohol  dependence gastroesophageal reflux disease hypertension and moderate recurrent major depression was admitted with hyponatremia.  He has been drinking beer as well as taking opioids and Xanax .  Apparently he got a DWI within the past week or so.  A few days prior to this admission he developed nausea vomiting and shakes.  The day of admission he was much worse and presented to the ER where his sodium was 115.  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  Because these factors he required more than 2 midnights in the hospital.  He was treated with IV normal saline with resolution of the hyponatremia.  His blood counts remained stable.  I as well as case management talked him at length about substance abuse but he seemed to have very little insight.  The day prior to discharge he complained of weakness.  Physical therapy recommended skilled nursing  facility with therapy as he was very weak and at risk of falls with morbidity mortality.  However he refused to consent to inpatient rehab.  I talked to him this morning and he is still refusing to go to for inpatient rehab.  I have told him that he is at high risk of morbidity and mortality but he seems to have no interest in going to the nursing facility for short-term rehab.  Therefore we will allow him to go home today.  He was to return to the ER if needed and he will follow-up with me in the next couple of days in the office.  He will resume his medications.  He understands that I will not give him oxycodone .  He will be weaned off of Xanax  and will not receive benzodiazepines from my office in the future.  He will follow-up with the pain clinic if pain medication is needed.  I spent less than 30 mins in the discharge of this patient.  Allergies  Allergies[1]    Last Vital sign  BP 127/82   Pulse 78   Temp 36.9 C (98.4 F) (Oral)   Resp 18   Ht 182.9 cm (6')   Wt 85.6 kg (188 lb 12.8 oz)   SpO2 100%   BMI 25.61 kg/m    Procedures  none  Discharge Medications     Your Medication List     CONTINUE taking these medications    ALPRAZolam  0.5 MG tablet Commonly  known as: XANAX  Take 1 tablet (0.5 mg total) by mouth.   amiodarone  200 MG tablet Commonly known as: PACERONE  Take 1 tablet (200 mg total) by mouth daily. TAKE 1 TABLET (200 MG TOTAL) BY MOUTH DAILY.   aspirin  81 MG tablet Commonly known as: ECOTRIN Take 1 tablet (81 mg total) by mouth.   atorvastatin  40 MG tablet Commonly known as: LIPITOR    citalopram  40 MG tablet Commonly known as: CeleXA  Take 1 tablet (40 mg total) by mouth. States he takes 20mg  a day.. half a pill   esomeprazole 40 MG capsule Commonly known as: NEXIUM Take 1 capsule (40 mg total) by mouth daily before breakfast.   metoPROLOL  tartrate 25 MG tablet Commonly known as: Lopressor  Take 1 tablet (25 mg total) by mouth.    oxyCODONE -acetaminophen  5-325 mg per tablet Commonly known as: PERCOCET Take 1 tablet by mouth.   spironolactone  25 MG tablet Commonly known as: ALDACTONE  Take 1 tablet (25 mg total) by mouth daily. TAKE 1 TABLET (25 MG TOTAL) BY MOUTH DAILY.        Pending Test Results   none   Lab Results    BLOOD Recent Labs    Units 09/10/24 1608 09/12/24 0428 09/13/24 0520  WBC 10*9/L 8.7 3.5* 4.2  HGB g/dL 84.4 87.8* 87.7*  HCT % 42.0 34.7* 34.4*  PLT 10*9/L 225 103* 99*   Recent Labs    Units 09/10/24 1608 09/10/24 1738 09/11/24 1722 09/12/24 0429 09/13/24 0520 09/14/24 0447  NA mmol/L  --  115* 120* 131* 133* 135  K mmol/L  --  5.0 4.1 3.7 4.0 3.8  CL mmol/L  --  83* 91* 98 100 102  CO2 mmol/L  --  20.8* 25.7 27.8 29.5 27.8  BUN mg/dL  --  16 5* 1* 2* 3*  CREATININE mg/dL  --  8.95 9.08 9.24* 9.14 0.75*  GLU mg/dL  --  44* 99 84 81 89  CALCIUM  mg/dL  --  8.1* 7.5* 7.8* 8.1* 8.0*  ALBUMIN  g/dL  --  2.4*  --   --   --   --   PROT g/dL  --  6.6  --   --   --   --   BILITOT mg/dL  --  1.9*  --   --   --   --   AST U/L  --  280*  --   --   --   --   ALT U/L  --  128*  --   --   --   --   ALKPHOS U/L  --  201*  --   --   --   --   MG mg/dL 2.3  --   --   --   --   --   PHOS mg/dL 4.0  --   --   --   --   --   LIPASE U/L 268*  --  316* 254*  --   --    Recent Labs    Units 09/10/24 1608  TROPONINI ng/L 25   Recent Labs    Units 09/10/24 1608  INR  0.99  APTT sec 25.9   Recent Labs    Units 09/10/24 1608  TSH uIU/mL 0.895  ETOH mg/dL 39*    No results for input(s): HEPAIGM, HEPBSAG, HEPBIGM, HEPCAB, MITOAB in the last 168 hours.  URINE Recent Labs    Units 09/10/24 1614  WBCUA /HPF 0  NITRITE  Negative  LEUKOCYTESUR  Negative  BACTERIA /HPF None Seen  RBCUA /HPF 0  BLOODU  Negative  GLUCOSEU  Negative  PROTEINUA  Negative  KETONESU  Trace*   Recent Labs    Units 09/10/24 1614  OPIAU  Positive*  BENZU  Negative  AMPHU  Negative   COCAU  Negative  CANNAU  Negative  BARBU  Negative    BODY FLUIDS No results for input(s): FTYP1, WBCFLUID, FNEUT, LYMPHSFL, FMONO, EOSFL, RBCFL, CLARITYFLUID, COLORFL in the last 168 hours.  ABG No results for input(s): O2SOUR, FIO2ART, PHART, PCO2ART, PO2ART, HCO3ART, O2SATART, BEART in the last 72 hours.  Microbiology Results (last day)     ** No results found for the last 24 hours. **       Imaging   ECG 12 Lead Result Date: 09/10/2024 Normal sinus rhythm with sinus arrhythmia Left axis deviation Nonspecific intraventricular conduction delay Minimal voltage criteria for LVH, may be normal variant ( [facility] product ) ST & T wave abnormality, consider inferolateral ischemia Abnormal ECG When compared with ECG of 16-May-2024 18:16, Sinus rhythm has replaced Atrial flutter Inverted T waves have replaced nonspecific T wave abnormality in Inferior leads T wave inversion more evident in Lateral leads QT has lengthened Confirmed by Cherie Searle ([id number]) on 09/10/2024 8:34:30 PM  CT Abdomen Pelvis W IV Contrast Result Date: 09/10/2024 Exam:  CT of the Abdomen and Pelvis with Contrast  History:  Elevated lipase, nausea vomiting  Technique:  Routine CT of the abdomen and pelvis with IV contrast.  IV contrast:  Given. Oral Contrast:  None.  AEC (automated exposure control) and/or manual techniques such as size-specific kV and mAs are employed where appropriate to reduce radiation exposure for all CT exams.  Comparison:  Abdomen pelvis CT 01/06/2009, report of CT angiogram chest abdomen pelvis [place], 01/23/2023. Images not available for direct comparison.  Abdomen and Pelvis CT Findings:  LOWER THORAX:  Motion artifact limits fine detail. No dense segmental consolidation or pleural effusion. Cardiomegaly without pericardial effusion.  HEPATOBILIARY:  Normal liver size.  Patent portal vein.    No solid-appearing or enhancing hepatic mass.  Low-attenuation liver  compatible with diffuse, heterogeneous steatosis.  No calcified gallstone.  Gallbladder is not unusually distended.  No intra or extra hepatic biliary dilatation.  PANCREAS: Pancreas enhances uniformly.   No peripancreatic collections or inflammation.    SPLEEN:  Normal in size and nonfocal.  ADRENALS:  No adrenal masses.  KIDNEYS/URETERS:  Kidneys are normal in size and position, without hydronephrosis or solid-appearing mass..    Scattered mild renal cortical scarring. 2 cm round low-density lesion medial cortex lower pole left kidney consistent with cyst. No additional imaging needed.  VASCULAR:  Normal caliber atherosclerotic abdominal aorta and major branches. Normal caliber IVC.  LYMPH NODES:  No retroperitoneal or mesenteric adenopathy.  BOWEL/MESENTERY:  Normal configuration stomach and duodenal C-loop.    No abnormally thickened or distended small bowel loops.  No focal inflammation right lower quadrant.  No focal pericolonic inflammation. There is not an unusual amount of colonic stool.  No significant free fluid. No collections identified.   PELVIC ORGANS:  Mildly enlarged prostate within normal limits for age. Symmetric seminal vesicles.  Negative for pelvic free fluid, adenopathy or collection.  Moderately distended urinary bladder is nonfocal.  BONES/SOFT TISSUES:  Degenerative disc disease lumbar spine most pronounced L5-S1. No destructive bone lesion. Subtle subchondral sclerosis right femoral head may reflect avascular necrosis.    1. Negative for bowel obstruction, focal inflammation or fluid collection. 2. Hepatic  steatosis. 3. 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  Signed (Electronic Signature): 09/10/2024 7:41 PM Signed By: John Matzko, MD   Discharge Instructions   Follow Up instructions and Outpatient Referrals    Ambulatory Referral to Palliative Care     Reason for referral: Palliative care   Is this a palliative referral for:  symptom management goals of  care coping support     Requested follow up plan: You would evaluate and manage.       Jerel KANDICE Sieving, MD             [1] Allergies Allergen Reactions  . Haldol  [Haloperidol ] Anaphylaxis  . Naproxen     nausea  . Sulfa (Sulfonamide Antibiotics)     nausea

## 2024-10-01 ENCOUNTER — Emergency Department (HOSPITAL_COMMUNITY)

## 2024-10-01 ENCOUNTER — Emergency Department (HOSPITAL_COMMUNITY)
Admission: EM | Admit: 2024-10-01 | Discharge: 2024-10-01 | Disposition: A | Discharge status: Home / Self Care | Attending: Emergency Medicine | Admitting: Emergency Medicine

## 2024-10-01 ENCOUNTER — Other Ambulatory Visit: Payer: Self-pay

## 2024-10-01 ENCOUNTER — Encounter (HOSPITAL_COMMUNITY): Payer: Self-pay

## 2024-10-01 DIAGNOSIS — F10929 Alcohol use, unspecified with intoxication, unspecified: Secondary | ICD-10-CM | POA: Diagnosis not present

## 2024-10-01 DIAGNOSIS — I110 Hypertensive heart disease with heart failure: Secondary | ICD-10-CM | POA: Insufficient documentation

## 2024-10-01 DIAGNOSIS — G8911 Acute pain due to trauma: Secondary | ICD-10-CM | POA: Diagnosis not present

## 2024-10-01 DIAGNOSIS — I5022 Chronic systolic (congestive) heart failure: Secondary | ICD-10-CM | POA: Insufficient documentation

## 2024-10-01 DIAGNOSIS — F10129 Alcohol abuse with intoxication, unspecified: Secondary | ICD-10-CM | POA: Diagnosis not present

## 2024-10-01 DIAGNOSIS — Z043 Encounter for examination and observation following other accident: Secondary | ICD-10-CM | POA: Diagnosis not present

## 2024-10-01 DIAGNOSIS — Z951 Presence of aortocoronary bypass graft: Secondary | ICD-10-CM | POA: Insufficient documentation

## 2024-10-01 DIAGNOSIS — F10120 Alcohol abuse with intoxication, uncomplicated: Secondary | ICD-10-CM | POA: Insufficient documentation

## 2024-10-01 DIAGNOSIS — I700 Atherosclerosis of aorta: Secondary | ICD-10-CM | POA: Diagnosis not present

## 2024-10-01 DIAGNOSIS — E119 Type 2 diabetes mellitus without complications: Secondary | ICD-10-CM | POA: Diagnosis not present

## 2024-10-01 DIAGNOSIS — I517 Cardiomegaly: Secondary | ICD-10-CM | POA: Diagnosis not present

## 2024-10-01 DIAGNOSIS — Y906 Blood alcohol level of 120-199 mg/100 ml: Secondary | ICD-10-CM | POA: Insufficient documentation

## 2024-10-01 DIAGNOSIS — I2510 Atherosclerotic heart disease of native coronary artery without angina pectoris: Secondary | ICD-10-CM | POA: Insufficient documentation

## 2024-10-01 DIAGNOSIS — M542 Cervicalgia: Secondary | ICD-10-CM | POA: Diagnosis not present

## 2024-10-01 DIAGNOSIS — S80812A Abrasion, left lower leg, initial encounter: Secondary | ICD-10-CM | POA: Insufficient documentation

## 2024-10-01 DIAGNOSIS — Z7901 Long term (current) use of anticoagulants: Secondary | ICD-10-CM | POA: Insufficient documentation

## 2024-10-01 DIAGNOSIS — S61401A Unspecified open wound of right hand, initial encounter: Secondary | ICD-10-CM | POA: Insufficient documentation

## 2024-10-01 DIAGNOSIS — I4891 Unspecified atrial fibrillation: Secondary | ICD-10-CM | POA: Diagnosis not present

## 2024-10-01 DIAGNOSIS — S5001XA Contusion of right elbow, initial encounter: Secondary | ICD-10-CM | POA: Insufficient documentation

## 2024-10-01 DIAGNOSIS — M19021 Primary osteoarthritis, right elbow: Secondary | ICD-10-CM | POA: Diagnosis not present

## 2024-10-01 DIAGNOSIS — S3991XA Unspecified injury of abdomen, initial encounter: Secondary | ICD-10-CM | POA: Insufficient documentation

## 2024-10-01 DIAGNOSIS — Z79899 Other long term (current) drug therapy: Secondary | ICD-10-CM | POA: Diagnosis not present

## 2024-10-01 DIAGNOSIS — W132XXA Fall from, out of or through roof, initial encounter: Secondary | ICD-10-CM | POA: Insufficient documentation

## 2024-10-01 DIAGNOSIS — S0990XA Unspecified injury of head, initial encounter: Secondary | ICD-10-CM

## 2024-10-01 DIAGNOSIS — T148XXA Other injury of unspecified body region, initial encounter: Secondary | ICD-10-CM

## 2024-10-01 DIAGNOSIS — M25521 Pain in right elbow: Secondary | ICD-10-CM | POA: Diagnosis not present

## 2024-10-01 DIAGNOSIS — R220 Localized swelling, mass and lump, head: Secondary | ICD-10-CM | POA: Diagnosis not present

## 2024-10-01 DIAGNOSIS — Z720 Tobacco use: Secondary | ICD-10-CM | POA: Diagnosis not present

## 2024-10-01 DIAGNOSIS — S0181XA Laceration without foreign body of other part of head, initial encounter: Secondary | ICD-10-CM | POA: Diagnosis not present

## 2024-10-01 DIAGNOSIS — M25421 Effusion, right elbow: Secondary | ICD-10-CM | POA: Diagnosis not present

## 2024-10-01 DIAGNOSIS — S3993XA Unspecified injury of pelvis, initial encounter: Secondary | ICD-10-CM | POA: Diagnosis not present

## 2024-10-01 DIAGNOSIS — S2241XA Multiple fractures of ribs, right side, initial encounter for closed fracture: Secondary | ICD-10-CM | POA: Diagnosis not present

## 2024-10-01 DIAGNOSIS — S0003XA Contusion of scalp, initial encounter: Secondary | ICD-10-CM | POA: Insufficient documentation

## 2024-10-01 DIAGNOSIS — R9389 Abnormal findings on diagnostic imaging of other specified body structures: Secondary | ICD-10-CM | POA: Diagnosis not present

## 2024-10-01 DIAGNOSIS — S80811A Abrasion, right lower leg, initial encounter: Secondary | ICD-10-CM | POA: Diagnosis not present

## 2024-10-01 DIAGNOSIS — Z8673 Personal history of transient ischemic attack (TIA), and cerebral infarction without residual deficits: Secondary | ICD-10-CM | POA: Diagnosis not present

## 2024-10-01 DIAGNOSIS — F10920 Alcohol use, unspecified with intoxication, uncomplicated: Secondary | ICD-10-CM

## 2024-10-01 DIAGNOSIS — R519 Headache, unspecified: Secondary | ICD-10-CM | POA: Diagnosis not present

## 2024-10-01 DIAGNOSIS — W19XXXA Unspecified fall, initial encounter: Secondary | ICD-10-CM | POA: Diagnosis not present

## 2024-10-01 LAB — URINALYSIS, ROUTINE W REFLEX MICROSCOPIC
Bacteria, UA: NONE SEEN
Bilirubin Urine: NEGATIVE
Glucose, UA: NEGATIVE mg/dL
Ketones, ur: NEGATIVE mg/dL
Leukocytes,Ua: NEGATIVE
Nitrite: NEGATIVE
Protein, ur: NEGATIVE mg/dL
Specific Gravity, Urine: 1.009 (ref 1.005–1.030)
pH: 6 (ref 5.0–8.0)

## 2024-10-01 LAB — I-STAT CHEM 8, ED
BUN: 5 mg/dL — ABNORMAL LOW (ref 8–23)
Calcium, Ion: 1.12 mmol/L — ABNORMAL LOW (ref 1.15–1.40)
Chloride: 98 mmol/L (ref 98–111)
Creatinine, Ser: 1.4 mg/dL — ABNORMAL HIGH (ref 0.61–1.24)
Glucose, Bld: 92 mg/dL (ref 70–99)
HCT: 40 % (ref 39.0–52.0)
Hemoglobin: 13.6 g/dL (ref 13.0–17.0)
Potassium: 4.5 mmol/L (ref 3.5–5.1)
Sodium: 135 mmol/L (ref 135–145)
TCO2: 25 mmol/L (ref 22–32)

## 2024-10-01 LAB — COMPREHENSIVE METABOLIC PANEL WITH GFR
ALT: 51 U/L — ABNORMAL HIGH (ref 0–44)
AST: 72 U/L — ABNORMAL HIGH (ref 15–41)
Albumin: 3.1 g/dL — ABNORMAL LOW (ref 3.5–5.0)
Alkaline Phosphatase: 66 U/L (ref 38–126)
Anion gap: 13 (ref 5–15)
BUN: 5 mg/dL — ABNORMAL LOW (ref 8–23)
CO2: 23 mmol/L (ref 22–32)
Calcium: 8.8 mg/dL — ABNORMAL LOW (ref 8.9–10.3)
Chloride: 98 mmol/L (ref 98–111)
Creatinine, Ser: 1.15 mg/dL (ref 0.61–1.24)
GFR, Estimated: >60 mL/min (ref >60)
Glucose, Bld: 93 mg/dL (ref 70–99)
Potassium: 4.5 mmol/L (ref 3.5–5.1)
Sodium: 134 mmol/L — ABNORMAL LOW (ref 135–145)
Total Bilirubin: 0.8 mg/dL (ref 0.0–1.2)
Total Protein: 6.6 g/dL (ref 6.5–8.1)

## 2024-10-01 LAB — CBC
HCT: 41.6 % (ref 39.0–52.0)
Hemoglobin: 13.8 g/dL (ref 13.0–17.0)
MCH: 32.9 pg (ref 26.0–34.0)
MCHC: 33.2 g/dL (ref 30.0–36.0)
MCV: 99 fL (ref 80.0–100.0)
Platelets: 265 K/uL (ref 150–400)
RBC: 4.2 MIL/uL — ABNORMAL LOW (ref 4.22–5.81)
RDW: 13.6 % (ref 11.5–15.5)
WBC: 7.1 K/uL (ref 4.0–10.5)
nRBC: 0 % (ref 0.0–0.2)

## 2024-10-01 LAB — PROTIME-INR
INR: 1 (ref 0.8–1.2)
Prothrombin Time: 13.4 s (ref 11.4–15.2)

## 2024-10-01 LAB — SAMPLE TO BLOOD BANK

## 2024-10-01 LAB — ETHANOL: Alcohol, Ethyl (B): 150 mg/dL — ABNORMAL HIGH (ref <15)

## 2024-10-01 LAB — I-STAT CG4 LACTIC ACID, ED: Lactic Acid, Venous: 1.6 mmol/L (ref 0.5–1.9)

## 2024-10-01 MED ORDER — FENTANYL CITRATE PF 50 MCG/ML IJ SOSY
PREFILLED_SYRINGE | INTRAMUSCULAR | Status: AC
  Administered 2024-10-01: 50 ug via INTRAVENOUS
  Filled 2024-10-01: qty 1

## 2024-10-01 MED ORDER — CEPHALEXIN 500 MG PO CAPS: 500.0000 mg | ORAL_CAPSULE | Freq: Three times a day (TID) | ORAL | 0 refills | Status: DC

## 2024-10-01 MED ORDER — FENTANYL CITRATE PF 50 MCG/ML IJ SOSY
50.0000 ug | PREFILLED_SYRINGE | Freq: Once | INTRAMUSCULAR | Status: AC
  Administered 2024-10-01: 50 ug via INTRAVENOUS
  Filled 2024-10-01: qty 1

## 2024-10-01 MED ORDER — SODIUM CHLORIDE 0.9 % IV BOLUS
1000.0000 mL | Freq: Once | INTRAVENOUS | Status: AC
  Administered 2024-10-01: 1000 mL via INTRAVENOUS

## 2024-10-01 MED ORDER — TETANUS-DIPHTH-ACELL PERTUSSIS 5-2-15.5 LF-MCG/0.5 IM SUSP
0.5000 mL | Freq: Once | INTRAMUSCULAR | Status: AC
  Administered 2024-10-01: 0.5 mL via INTRAMUSCULAR

## 2024-10-01 MED ORDER — ONDANSETRON HCL 4 MG/2ML IJ SOLN
4.0000 mg | Freq: Once | INTRAMUSCULAR | Status: AC
  Administered 2024-10-01: 4 mg via INTRAVENOUS
  Filled 2024-10-01: qty 2

## 2024-10-01 MED ORDER — IOHEXOL 350 MG/ML SOLN
75.0000 mL | Freq: Once | INTRAVENOUS | Status: AC | PRN
Start: 2024-10-01 — End: 2024-10-01
  Administered 2024-10-01: 75 mL via INTRAVENOUS

## 2024-10-01 MED ORDER — HYDROCODONE-ACETAMINOPHEN 5-325 MG PO TABS
1.0000 | ORAL_TABLET | Freq: Once | ORAL | Status: AC
  Administered 2024-10-01: 1 via ORAL
  Filled 2024-10-01: qty 1

## 2024-10-01 MED ORDER — CEPHALEXIN 500 MG PO CAPS: 500.0000 mg | ORAL_CAPSULE | Freq: Two times a day (BID) | ORAL | 0 refills | Status: AC

## 2024-10-01 MED ORDER — OXYCODONE-ACETAMINOPHEN 5-325 MG PO TABS: 1.0000 | ORAL_TABLET | Freq: Four times a day (QID) | ORAL | 0 refills | Status: AC | PRN

## 2024-10-01 MED ORDER — ONDANSETRON HCL 4 MG/2ML IJ SOLN
INTRAMUSCULAR | Status: AC
  Administered 2024-10-01: 4 mg via INTRAVENOUS
  Filled 2024-10-01: qty 2

## 2024-10-01 MED ORDER — FENTANYL CITRATE PF 50 MCG/ML IJ SOSY: 50.0000 ug | PREFILLED_SYRINGE | Freq: Once | INTRAMUSCULAR | Status: AC

## 2024-10-01 MED ORDER — ONDANSETRON HCL 4 MG/2ML IJ SOLN: 4.0000 mg | Freq: Once | INTRAMUSCULAR | Status: AC

## 2024-10-01 NOTE — ED Notes (Signed)
 Pt back to room from CT

## 2024-10-01 NOTE — ED Notes (Addendum)
 Pt stated they did not want shoulder immobilizer. Pt stated It's not going to make a difference for me

## 2024-10-01 NOTE — ED Notes (Signed)
 Patient transported to CT

## 2024-10-01 NOTE — ED Provider Notes (Signed)
 [facility] Provider Note  CSN: [id number] Arrival date & time: 10/01/24 1824  Chief Complaint(s) Fall and level 2 trauma  HPI Raymond Roberson is a 68 y.o. male with past medical history as below, significant for aortic stenosis, CABG, CAD, DM, chronic alcohol  abuse, systolic heart failure who presents to the ED with complaint of level 2 trauma  Patient reports he was on the roof of his domicile cleaning his chimney when he lost his balance and fell to the ground.  Single-story residence.  Hit his head on the ground.  Denies LOC.  Does report he was drinking alcohol  prior to the episode between 1-3 tall boy beverages, drinks etoh frequently.  Patient reports pain primarily to his occiput, headache.  He has some pain to his legs as well.  No numbness or tingling, some intermittent chest pain, no abdominal pain or vomiting.  He was previously prescribed Eliquis  but stopped taking it.  Unclear last tetanus shot.  He appears acutely intoxicated  Past Medical History Past Medical History:  Diagnosis Date   Aortic stenosis    Status post AVR with 27 mm Edwards INSPIRIS RESILIA pericardial valve January 2024   Bicuspid aortic valve    CAD (coronary artery disease)    Status post LIMA to LAD January 2024   Chronic alcohol  abuse    Classic migraine 09/02/2013   CVA (cerebral vascular accident) (HCC)    Diabetes mellitus without complication (HCC)    GERD (gastroesophageal reflux disease)    Gouty arthritis    Hypertension    Major depression    Mixed hyperlipidemia    Peptic ulcer disease    Sleep apnea    Patient Active Problem List   Diagnosis Date Noted   Chronic systolic CHF (congestive heart failure) (HCC) 11/06/2023   CAP (community acquired pneumonia) due to group B Streptococcus 07/09/2023   Encephalopathy acute 07/09/2023   Acute respiratory failure with hypoxia (HCC) 07/03/2023   Alcohol  withdrawal (HCC) 07/02/2023   Atrial  flutter with rapid ventricular response (HCC) 07/02/2023   Chronic back pain 07/02/2023   Alcohol  withdrawal with delirium (HCC) 07/02/2023   CAD (coronary artery disease) 04/10/2023   HLD (hyperlipidemia) 04/10/2023   Ischemic cardiomyopathy 04/10/2023   S/P CABG x 1 04/10/2023   S/P AVR 01/23/2023   NSTEMI (non-ST elevated myocardial infarction) (HCC) 01/19/2023   Acute on chronic systolic CHF (congestive heart failure) (HCC) 01/19/2023   HTN (hypertension) 01/19/2023   Classic migraine 09/02/2013   Dizziness and giddiness 09/02/2013   Home Medication(s) Prior to Admission medications   Medication Sig Start Date End Date Taking? Authorizing Provider  oxyCODONE -acetaminophen  (PERCOCET/ROXICET) 5-325 MG tablet Take 1 tablet by mouth every 6 (six) hours as needed for severe pain (pain score 7-10). 10/01/24  Yes Elnor Savant A, DO  ALPRAZolam  (XANAX ) 0.5 MG tablet Take 0.5 mg by mouth 2 (two) times daily.    [provider]  amiodarone  (PACERONE ) 200 MG tablet Take 1 tablet (200 mg total) by mouth daily. 09/03/24   Mallipeddi, Vishnu P, MD  apixaban  (ELIQUIS ) 5 MG TABS tablet Take 1 tablet (5 mg total) by mouth 2 (two) times daily. 11/06/23   Miriam Norris, NP  atorvastatin  (LIPITOR ) 40 MG tablet Take 1 tablet (40 mg total) by mouth daily. 08/06/24   Mallipeddi, Vishnu P, MD  cephALEXin (KEFLEX) 500 MG capsule Take 1 capsule (500 mg total) by mouth 2 (two) times daily for 7 days. 10/01/24 10/08/24  Elnor,  Jolana Runkles A, DO  citalopram  (CELEXA ) 40 MG tablet Take 10 mg by mouth daily.    [provider]  esomeprazole (NEXIUM) 40 MG capsule Take 40 mg by mouth daily. 04/27/22   [provider]  hydrocerin (EUCERIN) CREA Apply 1 Application topically 2 (two) times daily. 07/15/23   Swayze, Ava, DO  hydrocortisone  (ANUSOL -HC) 2.5 % rectal cream Place 1 application  rectally daily as needed for hemorrhoids or anal itching.    [provider]  levocetirizine (XYZAL) 5 MG  tablet Take 5 mg by mouth every evening.    [provider]  metoprolol  tartrate (LOPRESSOR ) 25 MG tablet Take 1 tablet (25 mg total) by mouth 2 (two) times daily. 11/06/23   Miriam Norris, NP  sildenafil (VIAGRA) 100 MG tablet Take 100 mg by mouth daily as needed for erectile dysfunction. 01/17/23   [provider]  spironolactone  (ALDACTONE ) 25 MG tablet Take 1 tablet (25 mg total) by mouth daily. 09/03/24   Mallipeddi, Diannah SQUIBB, MD                                                                                                                                    Past Surgical History Past Surgical History:  Procedure Laterality Date   AORTIC VALVE REPLACEMENT N/A 01/23/2023   Procedure: AORTIC VALVE REPLACEMENT (AVR) USING 27 INSPIRIS VALVE;  Surgeon: Lucas Dorise POUR, MD;  Location: [facility] OR;  Service: Open Heart Surgery;  Laterality: N/A;   BIOPSY  07/21/2022   Procedure: BIOPSY;  Surgeon: Eartha Angelia Sieving, MD;  Location: [facility];  Service: Gastroenterology;;   CIRCUMCISION     COLONOSCOPY WITH PROPOFOL  N/A 07/21/2022   Procedure: COLONOSCOPY WITH PROPOFOL ;  Surgeon: Eartha Angelia Sieving, MD;  Location: [facility];  Service: Gastroenterology;  Laterality: N/A;  11:15 ASA 2   CORONARY ARTERY BYPASS GRAFT N/A 01/23/2023   Procedure: CORONARY ARTERY BYPASS GRAFTING (CABG) 1 TIMES  USING LEFT INTERNAL MAMMARY ARTERY,  RIGHT SAPHENOUS LEG VEIN HARVESTED ENDOSCOPICALLY;  Surgeon: Lucas Dorise POUR, MD;  Location: [facility] OR;  Service: Open Heart Surgery;  Laterality: N/A;   CYST REMOVAL TRUNK  11/24/2012   back   INGUINAL HERNIA REPAIR Bilateral    POLYPECTOMY  07/21/2022   Procedure: POLYPECTOMY;  Surgeon: Eartha Angelia Sieving, MD;  Location: [facility];  Service: Gastroenterology;;   RHINOPLASTY     RIGHT HEART CATH AND CORONARY ANGIOGRAPHY N/A 01/22/2023   Procedure: RIGHT HEART CATH AND CORONARY ANGIOGRAPHY;  Surgeon: Court Dorn PARAS, MD;  Location: [facility]  INVASIVE CV LAB;  Service: Cardiovascular;  Laterality: N/A;   TEE WITHOUT CARDIOVERSION N/A 01/23/2023   Procedure: TRANSESOPHAGEAL ECHOCARDIOGRAM;  Surgeon: Lucas Dorise POUR, MD;  Location: [facility] OR;  Service: Open Heart Surgery;  Laterality: N/A;   Family History Family History  Problem Relation Age of Onset   Allergies Mother    Anxiety disorder Mother    CAD  Father    Heart failure Father     Social History Social History   Tobacco Use   Smoking status: Never   Smokeless tobacco: Current    Types: Snuff  Substance Use Topics   Alcohol  use: Not Currently   Drug use: No   Allergies Naproxen and Sulfa antibiotics  Review of Systems A thorough review of systems was obtained and all systems are negative except as noted in the HPI and PMH.   Physical Exam Vital Signs  I have reviewed the triage vital signs BP 101/78   Pulse (!) 109   Temp 97.9 F (36.6 C) (Temporal)   Resp 19   Ht 6' (1.829 m)   Wt 87 kg   SpO2 100%   BMI 26.01 kg/m  Physical Exam Vitals and nursing note reviewed.  Constitutional:      General: He is not in acute distress.    Appearance: He is well-developed.  HENT:     Head: Normocephalic.     Comments: Hematoma to occiput, abrasion to occiput    Right Ear: External ear normal.     Left Ear: External ear normal.     Mouth/Throat:     Mouth: Mucous membranes are moist.  Eyes:     General: No scleral icterus.    Extraocular Movements: Extraocular movements intact.     Pupils: Pupils are equal, round, and reactive to light.  Cardiovascular:     Rate and Rhythm: Normal rate and regular rhythm.     Pulses: Normal pulses.     Heart sounds: Normal heart sounds.  Pulmonary:     Effort: Pulmonary effort is normal. No respiratory distress.     Breath sounds: Normal breath sounds.  Abdominal:     General: Abdomen is flat.     Palpations: Abdomen is soft.     Tenderness: There is no abdominal tenderness.  Musculoskeletal:     Cervical back: No  rigidity.     Right lower leg: No edema.     Left lower leg: No edema.     Comments: Pelvis stable to ap pressure No sig pain w/ log roll to LE   Skin:    General: Skin is warm and dry.     Capillary Refill: Capillary refill takes less than 2 seconds.      Neurological:     Mental Status: He is alert and oriented to person, place, and time.     GCS: GCS eye subscore is 4. GCS verbal subscore is 4. GCS motor subscore is 6.     Cranial Nerves: Cranial nerves 2-12 are intact. No facial asymmetry.     Sensory: Sensation is intact. No sensory deficit.     Motor: Motor function is intact. No weakness or tremor.     Coordination: Coordination is intact.     Comments: Intoxicated Strength 5/5 to BLUE/BLLE, equal and symmetric  Gait testing deferred secondary to patient safety.   Psychiatric:        Mood and Affect: Mood normal.        Behavior: Behavior normal.     ED Results and Treatments Labs (all labs ordered are listed, but only abnormal results are displayed) Labs Reviewed  COMPREHENSIVE METABOLIC PANEL WITH GFR - Abnormal; Notable for the following components:      Result Value   Sodium 134 (*)    BUN 5 (*)    Calcium  8.8 (*)    Albumin  3.1 (*)    AST 72 (*)  ALT 51 (*)    All other components within normal limits  CBC - Abnormal; Notable for the following components:   RBC 4.20 (*)    All other components within normal limits  ETHANOL - Abnormal; Notable for the following components:   Alcohol , Ethyl (B) 150 (*)    All other components within normal limits  URINALYSIS, ROUTINE W REFLEX MICROSCOPIC - Abnormal; Notable for the following components:   Color, Urine STRAW (*)    Hgb urine dipstick SMALL (*)    All other components within normal limits  I-STAT CHEM 8, ED - Abnormal; Notable for the following components:   BUN 5 (*)    Creatinine, Ser 1.40 (*)    Calcium , Ion 1.12 (*)    All other components within normal limits  PROTIME-INR  I-STAT CG4 LACTIC ACID,  ED  SAMPLE TO BLOOD BANK                                                                                                                          Radiology DG Elbow Complete Right Result Date: 10/01/2024 CLINICAL DATA:  fall, pain EXAM: RIGHT ELBOW - COMPLETE 3+ VIEW COMPARISON:  None Available. FINDINGS: No acute fracture or dislocation. Elevation of both the anterior and posterior fat pads. Moderate to severe joint space loss with large osteophyte formation. Soft tissue swelling about the elbow. IMPRESSION: 1. Elevated anterior and posterior fat pads consistent with an elbow joint effusion. While no acute, displaced fracture is visualized; however, in the setting of trauma, elbow joint effusion is consistent with occult elbow fracture. 2. 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 Electronically Signed   By: Rogelia Myers M.D.   On: 10/01/2024 19:36   DG Hand Complete Right Result Date: 10/01/2024 CLINICAL DATA:  fall EXAM: RIGHT HAND - COMPLETE 3+ VIEW COMPARISON:  None Available. FINDINGS: No acute fracture or dislocation. Soft tissues are unremarkable. No radiopaque foreign body. IMPRESSION: No acute fracture or dislocation. Electronically Signed   By: Rogelia Myers M.D.   On: 10/01/2024 19:34   CT CHEST ABDOMEN PELVIS W CONTRAST Result Date: 10/01/2024 CLINICAL DATA:  Poly trauma, blunt.  Level 2 trauma due to a fall. EXAM: CT CHEST, ABDOMEN, AND PELVIS WITH CONTRAST TECHNIQUE: Multidetector CT imaging of the chest, abdomen and pelvis was performed following the standard protocol during bolus administration of intravenous contrast. RADIATION DOSE REDUCTION: This exam was performed according to the departmental dose-optimization program which includes automated exposure control, adjustment of the mA and/or kV according to patient size and/or use of iterative reconstruction technique. CONTRAST:  75mL OMNIPAQUE  IOHEXOL  350 MG/ML SOLN COMPARISON:  Chest and pelvic radiographs 10/01/2024. CT  chest abdomen pelvis 01/22/2023 FINDINGS: CT CHEST FINDINGS Cardiovascular: Postoperative changes in the mediastinum consistent with coronary bypass. Aortic valve prosthesis. Normal heart size. No pericardial effusions. Normal caliber thoracic aorta. No aortic dissection. Great vessel origins are patent. Scattered calcification in the aorta and pulmonary arteries. No central pulmonary emboli. Mediastinum/Nodes:  Thyroid  gland is unremarkable. No significant lymphadenopathy. No mesenteric collection or infiltration. Esophagus is decompressed. Lungs/Pleura: Lungs are clear.  No pleural effusion or pneumothorax. Musculoskeletal: Sternotomy wires are present. Normal alignment of the thoracic spine. No vertebral compression deformities. Ribs and sternum are nondepressed. Old right rib fractures. CT ABDOMEN PELVIS FINDINGS Hepatobiliary: No hepatic injury or perihepatic hematoma. Gallbladder is unremarkable. Pancreas: Unremarkable. No pancreatic ductal dilatation or surrounding inflammatory changes. Spleen: No splenic injury or perisplenic hematoma. Adrenals/Urinary Tract: No adrenal hemorrhage or renal injury identified. Bladder is unremarkable. Stomach/Bowel: Stomach is within normal limits. Appendix appears normal. No evidence of bowel wall thickening, distention, or inflammatory changes. Vascular/Lymphatic: Aortic atherosclerosis. No enlarged abdominal or pelvic lymph nodes. Reproductive: Prostate is unremarkable. Other: No abdominal wall hernia or abnormality. No abdominopelvic ascites. Postoperative changes consistent with previous inguinal hernia repairs. Musculoskeletal: Degenerative changes in the spine. Normal alignment. No vertebral compression deformities. Sacrum, pelvis, and hips appear intact. IMPRESSION: 1. No acute posttraumatic changes demonstrated in the chest, abdomen, or pelvis. 2. Aortic atherosclerosis. 3. Nonacute incidental findings as above. Electronically Signed   By: Elsie Gravely M.D.   On:  10/01/2024 19:12   CT Head Wo Contrast Result Date: 10/01/2024 CLINICAL DATA:  Fall 12 feet from roof with headaches and neck pain, initial encounter EXAM: CT HEAD WITHOUT CONTRAST CT CERVICAL SPINE WITHOUT CONTRAST TECHNIQUE: Multidetector CT imaging of the head and cervical spine was performed following the standard protocol without intravenous contrast. Multiplanar CT image reconstructions of the cervical spine were also generated. RADIATION DOSE REDUCTION: This exam was performed according to the departmental dose-optimization program which includes automated exposure control, adjustment of the mA and/or kV according to patient size and/or use of iterative reconstruction technique. COMPARISON:  None Available. FINDINGS: CT HEAD FINDINGS Brain: No evidence of acute infarction, hemorrhage, hydrocephalus, extra-axial collection or mass lesion/mass effect. Mild atrophic changes are noted. No acute Vascular: No hyperdense vessel or unexpected calcification. Skull: Normal. Negative for fracture or focal lesion. Sinuses/Orbits: Mucosal thickening is noted in the left maxillary antrum. Other: Scalp swelling and a 2.2 cm hematoma are noted in the left posterior parietal region. CT CERVICAL SPINE FINDINGS Alignment: Within normal limits. Skull base and vertebrae: 7 cervical segments are well visualized. Vertebral body height is well maintained. Disc space narrowing is noted at C5-6 and C6-7 with associated osteophytic changes. Neural foraminal narrowing is noted at these levels as well. Mild facet hypertrophic changes are noted. No acute fracture or acute facet abnormality is noted. Soft tissues and spinal canal: Surrounding soft tissue structures are within normal limits. Upper chest: Lung apices are within normal limits. Other: None IMPRESSION: CT of the head: Left-sided scalp hematoma. No acute intracranial abnormality noted. Mild atrophic changes. CT of the cervical spine: Degenerative changes without acute  abnormality. Electronically Signed   By: Oneil Devonshire M.D.   On: 10/01/2024 19:11   CT Cervical Spine Wo Contrast Result Date: 10/01/2024 CLINICAL DATA:  Fall 12 feet from roof with headaches and neck pain, initial encounter EXAM: CT HEAD WITHOUT CONTRAST CT CERVICAL SPINE WITHOUT CONTRAST TECHNIQUE: Multidetector CT imaging of the head and cervical spine was performed following the standard protocol without intravenous contrast. Multiplanar CT image reconstructions of the cervical spine were also generated. RADIATION DOSE REDUCTION: This exam was performed according to the departmental dose-optimization program which includes automated exposure control, adjustment of the mA and/or kV according to patient size and/or use of iterative reconstruction technique. COMPARISON:  None Available. FINDINGS: CT HEAD FINDINGS Brain:  No evidence of acute infarction, hemorrhage, hydrocephalus, extra-axial collection or mass lesion/mass effect. Mild atrophic changes are noted. No acute Vascular: No hyperdense vessel or unexpected calcification. Skull: Normal. Negative for fracture or focal lesion. Sinuses/Orbits: Mucosal thickening is noted in the left maxillary antrum. Other: Scalp swelling and a 2.2 cm hematoma are noted in the left posterior parietal region. CT CERVICAL SPINE FINDINGS Alignment: Within normal limits. Skull base and vertebrae: 7 cervical segments are well visualized. Vertebral body height is well maintained. Disc space narrowing is noted at C5-6 and C6-7 with associated osteophytic changes. Neural foraminal narrowing is noted at these levels as well. Mild facet hypertrophic changes are noted. No acute fracture or acute facet abnormality is noted. Soft tissues and spinal canal: Surrounding soft tissue structures are within normal limits. Upper chest: Lung apices are within normal limits. Other: None IMPRESSION: CT of the head: Left-sided scalp hematoma. No acute intracranial abnormality noted. Mild atrophic  changes. CT of the cervical spine: Degenerative changes without acute abnormality. Electronically Signed   By: Oneil Devonshire M.D.   On: 10/01/2024 19:11   DG Pelvis 1-2 Views Result Date: 10/01/2024 CLINICAL DATA:  Trauma EXAM: PELVIS - 1-2 VIEW COMPARISON:  None Available. FINDINGS: No evidence of pelvic fracture or diastasis.No acute hip fracture or dislocation.There is no evidence of arthropathy or other focal bone abnormality. Soft tissues are unremarkable. IMPRESSION: No acute fracture, pelvic bone diastasis, or dislocation. Electronically Signed   By: Rogelia Myers M.D.   On: 10/01/2024 18:51   DG Chest Portable 1 View Result Date: 10/01/2024 CLINICAL DATA:  Trauma EXAM: PORTABLE CHEST - 1 VIEW COMPARISON:  July 07, 2023 FINDINGS: Elevation of the left hemidiaphragm. No focal airspace consolidation, pleural effusion, or pneumothorax. Moderate cardiomegaly. Sternotomy wires. Aortic valve replacement. Tortuous aorta with aortic atherosclerosis. No acute fracture or destructive lesions. Remote right-sided rib fractures. Multilevel thoracic osteophytosis. IMPRESSION: No acute cardiopulmonary abnormality. Electronically Signed   By: Rogelia Myers M.D.   On: 10/01/2024 18:49    Pertinent labs & imaging results that were available during my care of the patient were reviewed by me and considered in my medical decision making (see MDM for details).  Medications Ordered in ED Medications  ondansetron  (ZOFRAN ) injection 4 mg (4 mg Intravenous Given 10/01/24 1829)  fentaNYL  (SUBLIMAZE ) injection 50 mcg (50 mcg Intravenous Given 10/01/24 1831)  Tdap (ADACEL) injection 0.5 mL (0.5 mLs Intramuscular Given 10/01/24 1833)  sodium chloride  0.9 % bolus 1,000 mL (0 mLs Intravenous Stopped 10/01/24 2116)  iohexol  (OMNIPAQUE ) 350 MG/ML injection 75 mL (75 mLs Intravenous Contrast Given 10/01/24 1902)  fentaNYL  (SUBLIMAZE ) injection 50 mcg (50 mcg Intravenous Given 10/01/24 1942)  ondansetron  (ZOFRAN ) injection 4 mg  (4 mg Intravenous Given 10/01/24 2100)  HYDROcodone -acetaminophen  (NORCO/VICODIN) 5-325 MG per tablet 1 tablet (1 tablet Oral Given 10/01/24 2209)  Procedures .Critical Care  Performed by: Elnor Jayson LABOR, DO Authorized by: Elnor Jayson LABOR, DO   Critical care provider statement:    Critical care time (minutes):  35   Critical care time was exclusive of:  Separately billable procedures and treating other patients   Critical care was necessary to treat or prevent imminent or life-threatening deterioration of the following conditions:  Trauma   Critical care was time spent personally by me on the following activities:  Development of treatment plan with patient or surrogate, discussions with consultants, evaluation of patient's response to treatment, examination of patient, ordering and review of laboratory studies, ordering and review of radiographic studies, ordering and performing treatments and interventions, pulse oximetry, re-evaluation of patient's condition, review of old charts and obtaining history from patient or surrogate Ultrasound ED FAST  Date/Time: 10/01/2024 6:41 PM  Performed by: Elnor Jayson LABOR, DO Authorized by: Elnor Jayson LABOR, DO  Procedure details:    Indications: blunt abdominal trauma and blunt chest trauma       Assess for:  Hemothorax, intra-abdominal fluid, pericardial effusion and pneumothorax    Technique:  Abdominal, cardiac and chest    Images: archived    Study Limitations: patient compliance  Abdominal findings:    L kidney:  Visualized   R kidney:  Visualized   Liver:  Visualized    Bladder:  Visualized, Foley catheter not visualized   Hepatorenal space visualized: identified     Splenorenal space: identified     Rectovesical free fluid: not identified     Splenorenal free fluid: not identified     Hepatorenal space free fluid:  not identified   Cardiac findings:    Heart:  Visualized   Wall motion: identified     Pericardial effusion: not identified   Chest findings:    L lung sliding: identified     R lung sliding: identified     Fluid in thorax: not identified   Comments:     EFAST NEG .Laceration Repair  Date/Time: 10/01/2024 11:39 PM  Performed by: Elnor Jayson LABOR, DO Authorized by: Elnor Jayson LABOR, DO   Consent:    Consent obtained:  Verbal   Consent given by:  Patient   Risks, benefits, and alternatives were discussed: yes     Risks discussed:  Infection and pain   Alternatives discussed:  No treatment and delayed treatment Universal protocol:    Procedure explained and questions answered to patient or proxy's satisfaction: yes     Immediately prior to procedure, a time out was called: yes     Patient identity confirmed:  Verbally with patient and arm band Anesthesia:    Anesthesia method:  None Laceration details:    Location:  Finger   Finger location:  L long finger   Length (cm):  1.5   Depth (mm):  1 Pre-procedure details:    Preparation:  Patient was prepped and draped in usual sterile fashion and imaging obtained to evaluate for foreign bodies Exploration:    Limited defect created (wound extended): no     Hemostasis achieved with:  Direct pressure   Imaging obtained: x-ray     Imaging outcome: foreign body not noted     Wound exploration: wound explored through full range of motion and entire depth of wound visualized   Treatment:    Area cleansed with:  Saline   Amount of cleaning:  Standard   Debridement:  None Skin repair:    Repair method:  Tissue adhesive Approximation:  Approximation:  Close Repair type:    Repair type:  Simple Post-procedure details:    Dressing:  Non-adherent dressing   Procedure completion:  Tolerated well, no immediate complications   (including critical care time)  Medical Decision Making / ED Course    Medical Decision Making:     CASWELL ALVILLAR is a 68 y.o. male with past medical history as below, significant for aortic stenosis, CABG, CAD, DM, chronic alcohol  abuse, systolic heart failure who presents to the ED with complaint of level 2 trauma. The complaint involves an extensive differential diagnosis and also carries with it a high risk of complications and morbidity.  Serious etiology was considered. Ddx includes but is not limited to: Differential diagnoses for head trauma includes subdural hematoma, epidural hematoma, acute concussion, traumatic subarachnoid hemorrhage, cerebral contusions, etc.   Complete initial physical exam performed, notably the patient was in no acute distress, he is intoxicated.    Reviewed and confirmed nursing documentation for past medical history, family history, social history.  Vital signs reviewed.    Fall from roof with head injury Pos occult olecranon fx Closed head injury > - Primary survey was completed, airway is intact, clear breath sounds bilateral, trachea is midline.  Pulses all 4 extremities, extremities warm well-perfused.  GCS 14, intoxicated.  Pupils 3 mm reactive bilateral, hematoma noted to occiput, abrasions noted to lower extremities. - E-FAST negative - trauma scans stable - right elbow with +fat pad, he has full rom to his left elbow and is NVI.  Consider occult fracture given trauma.  Patient recommended for immobilizer and splint.  He refused the splint.  Reports he will consider wearing the immobilizer, encouraged to follow-up orthopedics, patient reports that he will think about it. >>  He has full range of motion to his right upper extremity.  Right upper extremity is NVI. - hematoma noted to his occiput, no evidence of ICH on imaging.  He has a headache, likely from the injury.  Given concussion precautions.  Symptoms improved following analgesia.  He is ambulatory, neuro intact, tolerant p.o. difficulty - He is skin tears noted to his fingertips, these were  repaired.  He also has superficial abrasions to his extremities.  Patient is worried that his wounds will get infected and he cannot afford to come back to the hospital, wounds were cleaned by nurse.  Will give him empiric Keflex.  Chronic alcohol  abuse> - Patient is intoxicated.  Does not appear to be acutely withdrawing.  I strong encouraged patient to refrain from excessive alcohol  use in the future and attempt to reduce his intake.        Patient here with fall from his home, hit his head on the ground.  Unsure LOC.  Work appears reassuring, concern for possible occult olecranon fracture, concussion.  Patient provided sling.  Wounds to his hands were cleaned and repaired.  Patient is ambulatory, he appears clinically sober.  Will go home with girlfriend.  I have discussed the diagnosis/risks/treatment options with the patient and family.  Evaluation and diagnostic testing in the emergency department does not suggest an emergent condition requiring admission or immediate intervention beyond what has been performed at this time.  They will follow up with pcp/ortho. We also discussed returning to the ED immediately if new or worsening sx occur. We discussed the sx which are most concerning (e.g., sudden worsening pain, fever, inability to tolerate by mouth) that necessitate immediate return.    The patient appears reasonably screen and/or  stabilized for discharge and I doubt any other medical condition or other [facility] requiring further screening, evaluation, or treatment in the ED at this time prior to discharge.                         Additional history obtained: -Additional history obtained from family and ems -External records from outside source obtained and reviewed including: Chart review including previous notes, labs, imaging, consultation notes including  Prior hospitalization, prior labs, medications   Lab Tests: -I ordered, reviewed, and interpreted labs.   The  pertinent results include:   Labs Reviewed  COMPREHENSIVE METABOLIC PANEL WITH GFR - Abnormal; Notable for the following components:      Result Value   Sodium 134 (*)    BUN 5 (*)    Calcium  8.8 (*)    Albumin  3.1 (*)    AST 72 (*)    ALT 51 (*)    All other components within normal limits  CBC - Abnormal; Notable for the following components:   RBC 4.20 (*)    All other components within normal limits  ETHANOL - Abnormal; Notable for the following components:   Alcohol , Ethyl (B) 150 (*)    All other components within normal limits  URINALYSIS, ROUTINE W REFLEX MICROSCOPIC - Abnormal; Notable for the following components:   Color, Urine STRAW (*)    Hgb urine dipstick SMALL (*)    All other components within normal limits  I-STAT CHEM 8, ED - Abnormal; Notable for the following components:   BUN 5 (*)    Creatinine, Ser 1.40 (*)    Calcium , Ion 1.12 (*)    All other components within normal limits  PROTIME-INR  I-STAT CG4 LACTIC ACID, ED  SAMPLE TO BLOOD BANK    Notable for labs are stable  EKG   EKG Interpretation Date/Time:    Ventricular Rate:    PR Interval:    QRS Duration:    QT Interval:    QTC Calculation:   R Axis:      Text Interpretation:           Imaging Studies ordered: I ordered imaging studies including trauma scans, x-ray of elbow, hand, chest and pelvis I independently visualized the following imaging with scope of interpretation limited to determining acute life threatening conditions related to emergency care; findings noted above I agree with the radiologist interpretation If any imaging was obtained with contrast I closely monitored patient for any possible adverse reaction a/w contrast administration in the emergency department   Medicines ordered and prescription drug management: Meds ordered this encounter  Medications   ondansetron  (ZOFRAN ) injection 4 mg   fentaNYL  (SUBLIMAZE ) injection 50 mcg   Tdap (ADACEL) injection 0.5 mL    ondansetron  (ZOFRAN ) 4 MG/2ML injection    Cheney, Alaina G: cabinet override   fentaNYL  (SUBLIMAZE ) 50 MCG/ML injection    Cheney, Alaina G: cabinet override   sodium chloride  0.9 % bolus 1,000 mL   iohexol  (OMNIPAQUE ) 350 MG/ML injection 75 mL   fentaNYL  (SUBLIMAZE ) injection 50 mcg   ondansetron  (ZOFRAN ) injection 4 mg   HYDROcodone -acetaminophen  (NORCO/VICODIN) 5-325 MG per tablet 1 tablet    Refill:  0   DISCONTD: cephALEXin (KEFLEX) 500 MG capsule    Sig: Take 1 capsule (500 mg total) by mouth 3 (three) times daily for 7 days.    Dispense:  21 capsule    Refill:  0   oxyCODONE -acetaminophen  (PERCOCET/ROXICET) 5-325 MG  tablet    Sig: Take 1 tablet by mouth every 6 (six) hours as needed for severe pain (pain score 7-10).    Dispense:  15 tablet    Refill:  0   cephALEXin (KEFLEX) 500 MG capsule    Sig: Take 1 capsule (500 mg total) by mouth 2 (two) times daily for 7 days.    Dispense:  14 capsule    Refill:  0    -I have reviewed the patients home medicines and have made adjustments as needed   Consultations Obtained: Not applicable  Cardiac Monitoring: The patient was maintained on a cardiac monitor.  I personally viewed and interpreted the cardiac monitored which showed an underlying rhythm of: NSR / sinus tachycardia Continuous pulse oximetry interpreted by myself, 99% on RA.    Social Determinants of Health:  Diagnosis or treatment significantly limited by social determinants of health: current tobacco use, current etoh use Counseled patient for approximately 3 minutes regarding smoking cessation. Discussed risks of smoking and how they applied and affected their visit here today. Patient not ready to quit at this time, however will follow up with their primary doctor when they are.   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    Final Clinical Impression(s) / ED Diagnoses Final diagnoses:  Fall, initial encounter  Injury of head, initial encounter  Hematoma  Contusion of right elbow, initial encounter  Alcoholic intoxication without complication  Aortic atherosclerosis        Elnor Jayson LABOR, DO 10/01/24 2347

## 2024-10-01 NOTE — ED Notes (Signed)
..  Patient is A&Ox4 upon discharge. Patient verbalized understanding of prescriptions, discharge instructions and follow-up care. Patient ambulatory from ED with steady gait.

## 2024-10-01 NOTE — Progress Notes (Signed)
 Orthopedic Tech Progress Note Patient Details:  Raymond Roberson [birth year] [id number]  Patient ID: Raymond Roberson, male   DOB: 1956-05-10, 68 y.o.   MRN: [id number] Level 2 trauma Raymond Roberson 10/01/2024, 6:35 PM

## 2024-10-01 NOTE — Discharge Instructions (Addendum)
 It was a pleasure caring for you today in the emergency department.  Please follow-up with orthopedic surgery regarding your right elbow injury, Dr Shari, please call tomorrow as you might have a small fracture in one of the bones to your elbow  Recommend you cut back on your alcohol  use  Please keep your wounds clean and dry.  You can wash them with gentle soap and water twice daily.  Noticed any significant redness, pus draining from the wounds, fever please return to the ER for recheck or follow-up with your PCP.  Based on the events which brought you to the ER today, it is possible that you may have a concussion. A concussion occurs when there is a blow to the head or body, with enough force to shake the brain and disrupt how the brain functions. You may experience symptoms such as headaches, sensitivity to light/noise, dizziness, cognitive slowing, difficulty concentrating / remembering, trouble sleeping and drowsiness. These symptoms may last anywhere from hours/days to potentially weeks/months. While these symptoms are very frustrating and perhaps debilitating, it is important that you remember that they will improve over time. Everyone has a different rate of recovery; it is difficult to predict when your symptoms will resolve. In order to allow for your brain to heal after the injury, we recommend that you see your primary physician or a physician knowledgeable in concussion management. We also advise you to let your body and brain rest: avoid physical activities (sports, gym, and exercise) and reduce cognitive demands (reading, texting, TV watching, computer use, video games, etc). School attendance, after-school activities and work may need to be modified to avoid increasing symptoms. We recommend against driving until until all symptoms have resolved. Come back to the ER right away if you are having repeated episodes of vomiting, severe/worsening headache/dizziness or any other symptom that  alarms you. We recommended that someone stay with you for the next 24 hours to monitor for these worrisome symptoms.   Please return to the emergency department for any worsening or worrisome symptoms.     RESOURCE GUIDE  Chronic Pain Problems: Contact [facility]  [phone] Patients need to be referred by their primary care doctor.  Insufficient Money for Medicine: Contact [employer]:  call [phone]  No Primary Care Doctor: Call [facility]  [phone] - can help you locate a primary care doctor that  accepts your insurance, provides certain services, etc. Physician Referral Service- [phone]  Agencies that provide inexpensive medical care: [facility]  [phone] [facility]  [phone] [facility]  [phone] [facility]  [phone] [facility]  [phone] [facility]  [phone]  Medicaid-accepting [facility] Providers: [facility]- [address], [address]  [phone], Mon-Fri 9am-7pm, Sat 9am-1pm [facility]- [address] [place], [address] [place]  [phone] [facility]- [address] [place]  [phone] [facility]- [address]  [phone] [facility]- [address] [place], [address], [phone]  Only accepts [place] patients after they have their name  applied to their card  Self Pay (no insurance) in [facility]: Sickle Cell Patients - [facility]  [address] [place], [phone] [facility]- [address] [place]  [phone]       [place] [facility] [place]- [address]       -  [facility]- see information above (Speak to [employer] if you do not have insurance)       -  [facility]- [address],  [phone]       -  [facility]- [address], [phone]       -  Dr Catalina-  [address], [place], [phone]       -  [facility] - [address] [phone]       -  [facility]- [address] [phone], also [address] [phone]       -     [facility]- [address] [place], [phone], 1st & 3rd Saturday         every month, 10am-1pm  -     [facility]   [address] [place], [place].   Phone:  [phone], Fax:  [phone]. Hours of Operation:  9 am - 6 pm, M-F.  -     [facility]   [address], [place]   Phone: [phone], Fax: [phone]. Hours of Operation:  8:30 am - 5:30 pm, M-F.    Dental Assistance If unable to pay or uninsured, contact:  [facility]. to become qualified for the adult dental clinic.  Patients with Medicaid: [facility] [phone] [address], [phone] [address], [phone]  If unable to pay, or uninsured, contact [facility] [phone] in [place], [phone] in [facility]) to become qualified for the adult dental clinic  [facility] [address] [place], [place] [address] [phone] [email or web address]  Other Proofreader Services: [facility]- [address] [place], [place], [place], [address], [phone], Ext. [phone], 2nd and 4th Thursday of the month at 6:30am.  10 clients each day by appointment, can sometimes see walk-in patients if someone does not show for an appointment. [facility]- [address] [place], [place], [address], [phone] [facility] [address] [place], [place], [address], [phone] [facility]- [phone] [facility]- [phone] [facility] [phone]

## 2024-10-01 NOTE — ED Triage Notes (Signed)
 Pt presents to the ED with Ems for fall from a roof estimating about 12 ft. EMS reports pt had been drink when he fell. A&Ox4. No LOC. Hematoma noted to his head. Abrasions noted to right leg and forearm. No SHOB but pain noted on inspiration. Pt reports to EMS that he is suppose to be on Eliquis  but has not taken it in quite some time. C collar in place. 16g IV started by EMS.   EMS vitals BP 130/102

## 2024-10-01 NOTE — Progress Notes (Signed)
 Chaplain responded to page for Pt who had fallen approximately 12 feet. Upon arrival, Pt was awake and responsive.  Medical team was actively assessing and preparing Pt for transport to CT Scan. IT was noted that the Pt had been drinking prior to the incident.  No family members were present at the time of visit. Chaplain remained available to provide emotional an spiritual support as needed.

## 2024-10-01 NOTE — ED Notes (Signed)
 Patient now requesting shoulder immobilizer prior to d/c. Shoulder immobilizer placed.

## 2024-10-02 DIAGNOSIS — S5001XA Contusion of right elbow, initial encounter: Secondary | ICD-10-CM | POA: Diagnosis not present

## 2024-10-03 ENCOUNTER — Ambulatory Visit: Attending: Nurse Practitioner | Admitting: Nurse Practitioner

## 2024-10-08 DIAGNOSIS — F331 Major depressive disorder, recurrent, moderate: Secondary | ICD-10-CM | POA: Diagnosis not present

## 2024-10-08 DIAGNOSIS — G43909 Migraine, unspecified, not intractable, without status migrainosus: Secondary | ICD-10-CM | POA: Diagnosis not present

## 2024-10-08 DIAGNOSIS — E7849 Other hyperlipidemia: Secondary | ICD-10-CM | POA: Diagnosis not present

## 2024-10-08 DIAGNOSIS — E782 Mixed hyperlipidemia: Secondary | ICD-10-CM | POA: Diagnosis not present

## 2024-10-08 DIAGNOSIS — E1165 Type 2 diabetes mellitus with hyperglycemia: Secondary | ICD-10-CM | POA: Diagnosis not present

## 2024-10-08 DIAGNOSIS — Z6824 Body mass index (BMI) 24.0-24.9, adult: Secondary | ICD-10-CM | POA: Diagnosis not present

## 2024-10-23 ENCOUNTER — Telehealth: Payer: Self-pay | Admitting: Internal Medicine

## 2024-10-23 ENCOUNTER — Other Ambulatory Visit: Payer: Self-pay | Admitting: Internal Medicine

## 2024-10-23 MED ORDER — ATORVASTATIN CALCIUM 40 MG PO TABS: 40.0000 mg | ORAL_TABLET | Freq: Every day | ORAL | 0 refills | Status: DC

## 2024-10-23 NOTE — Telephone Encounter (Signed)
*  STAT* If patient is at the pharmacy, call can be transferred to refill team.   1. Which medications need to be refilled? (please list name of each medication and dose if known)   atorvastatin  (LIPITOR ) 40 MG tablet     2. Would you like to learn more about the convenience, safety, & potential cost savings by using the [facility]?No   3. Are you open to using the [facility] (Type [facility]. No   4. Which pharmacy/location (including street and city if local pharmacy) is medication to be sent to?[facility] - [place], [place] - [address]    5. Do they need a 30 day or 90 day supply? 90 day   Pt is out of medication.  Pt scheduled 12/30/24

## 2024-10-23 NOTE — Telephone Encounter (Signed)
 Pt's medication was sent to pt's pharmacy as requested. Confirmation received.

## 2024-10-24 ENCOUNTER — Other Ambulatory Visit: Payer: Self-pay | Admitting: Internal Medicine

## 2024-12-30 ENCOUNTER — Telehealth: Payer: Self-pay | Admitting: Internal Medicine

## 2024-12-30 ENCOUNTER — Encounter: Payer: Self-pay | Admitting: *Deleted

## 2024-12-30 ENCOUNTER — Ambulatory Visit: Admitting: Internal Medicine

## 2024-12-30 NOTE — Telephone Encounter (Signed)
" °*  STAT* If patient is at the pharmacy, call can be transferred to refill team.   1. Which medications need to be refilled? (please list name of each medication and dose if known)   amiodarone  (PACERONE ) 200 MG tablet  atorvastatin  (LIPITOR ) 40 MG tablet  spironolactone  (ALDACTONE ) 25 MG tablet   2. Would you like to learn more about the convenience, safety, & potential cost savings by using the [facility]? No   3. Are you open to using the [facility] ([facility].) No   4. Which pharmacy/location (including street and city if local pharmacy) is medication to be sent to?  [facility] - [place], [place] - [address]   5. Do they need a 30 day or 90 day supply? 90 day  "

## 2025-01-01 ENCOUNTER — Ambulatory Visit: Payer: Self-pay | Admitting: Internal Medicine

## 2025-01-01 ENCOUNTER — Encounter: Payer: Self-pay | Admitting: Internal Medicine

## 2025-01-01 NOTE — Telephone Encounter (Signed)
 The patient has been notified of the result and verbalized understanding.  All questions (if any) were answered. Advised him that someone from scheduling will reach out to him to move appointment up Littie CHRISTELLA Croak, CMA 01/01/2025 1:51 PM

## 2025-01-01 NOTE — Telephone Encounter (Signed)
-----   Message from Vishnu Mallipeddi, MD sent at 01/01/2025  1:13 PM EST ----- Counsel against alcohol  use (per chart review). Needs cardiology follow up in 2 weeks with APP/MD.

## 2025-01-02 MED ORDER — SPIRONOLACTONE 25 MG PO TABS: 25.0000 mg | ORAL_TABLET | Freq: Every day | ORAL | 0 refills | Status: AC

## 2025-01-02 MED ORDER — ATORVASTATIN CALCIUM 40 MG PO TABS: 40.0000 mg | ORAL_TABLET | Freq: Every day | ORAL | 0 refills | Status: AC

## 2025-01-02 MED ORDER — AMIODARONE HCL 200 MG PO TABS: 200.0000 mg | ORAL_TABLET | Freq: Every day | ORAL | 0 refills | Status: AC

## 2025-01-02 NOTE — Telephone Encounter (Signed)
 Pt's medication was sent to pt's pharmacy as requested. Confirmation received.

## 2025-01-13 ENCOUNTER — Ambulatory Visit: Admitting: Nurse Practitioner

## 2025-01-23 ENCOUNTER — Encounter: Payer: Self-pay | Admitting: Internal Medicine

## 2025-01-23 NOTE — Telephone Encounter (Signed)
 Error

## 2025-04-09 ENCOUNTER — Ambulatory Visit: Admitting: Internal Medicine
